# Patient Record
Sex: Female | Born: 1945
Health system: Southern US, Community
[De-identification: ages and names within clinical notes are randomized; demographics above are authoritative.]

## PROBLEM LIST (undated history)

## (undated) ENCOUNTER — Inpatient Hospital Stay (HOSPITAL_COMMUNITY): Payer: BC Managed Care – PPO

## (undated) DIAGNOSIS — K219 Gastro-esophageal reflux disease without esophagitis: Secondary | ICD-10-CM

## (undated) DIAGNOSIS — F419 Anxiety disorder, unspecified: Secondary | ICD-10-CM

## (undated) DIAGNOSIS — I10 Essential (primary) hypertension: Secondary | ICD-10-CM

## (undated) DIAGNOSIS — K449 Diaphragmatic hernia without obstruction or gangrene: Secondary | ICD-10-CM

## (undated) DIAGNOSIS — E559 Vitamin D deficiency, unspecified: Secondary | ICD-10-CM

## (undated) DIAGNOSIS — C649 Malignant neoplasm of unspecified kidney, except renal pelvis: Secondary | ICD-10-CM

## (undated) DIAGNOSIS — R0789 Other chest pain: Secondary | ICD-10-CM

## (undated) DIAGNOSIS — M810 Age-related osteoporosis without current pathological fracture: Secondary | ICD-10-CM

## (undated) HISTORY — DX: Anxiety disorder, unspecified: F41.9

## (undated) HISTORY — PX: TUBAL LIGATION: SHX77

## (undated) HISTORY — DX: Malignant neoplasm of unspecified kidney, except renal pelvis: C64.9

## (undated) HISTORY — DX: Diaphragmatic hernia without obstruction or gangrene: K44.9

## (undated) HISTORY — DX: Essential (primary) hypertension: I10

## (undated) HISTORY — DX: Gastro-esophageal reflux disease without esophagitis: K21.9

## (undated) HISTORY — DX: Other chest pain: R07.89

## (undated) HISTORY — DX: Age-related osteoporosis without current pathological fracture: M81.0

## (undated) HISTORY — DX: Vitamin D deficiency, unspecified: E55.9

---

## 1983-08-25 HISTORY — PX: LEFT OOPHORECTOMY: SHX1961

## 1997-08-24 DIAGNOSIS — C649 Malignant neoplasm of unspecified kidney, except renal pelvis: Secondary | ICD-10-CM

## 1997-08-24 HISTORY — PX: NEPHRECTOMY: SHX65

## 1997-08-24 HISTORY — DX: Malignant neoplasm of unspecified kidney, except renal pelvis: C64.9

## 1998-01-16 ENCOUNTER — Other Ambulatory Visit: Admission: RE | Admit: 1998-01-16 | Discharge: 1998-01-16 | Payer: Self-pay | Admitting: *Deleted

## 1998-05-27 ENCOUNTER — Other Ambulatory Visit: Admission: RE | Admit: 1998-05-27 | Discharge: 1998-05-27 | Payer: Self-pay | Admitting: *Deleted

## 1998-06-04 ENCOUNTER — Encounter: Payer: Self-pay | Admitting: Urology

## 1998-06-07 ENCOUNTER — Inpatient Hospital Stay (HOSPITAL_COMMUNITY): Admission: RE | Admit: 1998-06-07 | Discharge: 1998-06-10 | Payer: Self-pay | Admitting: Urology

## 1999-01-13 ENCOUNTER — Other Ambulatory Visit: Admission: RE | Admit: 1999-01-13 | Discharge: 1999-01-13 | Payer: Self-pay | Admitting: *Deleted

## 2000-07-09 ENCOUNTER — Other Ambulatory Visit: Admission: RE | Admit: 2000-07-09 | Discharge: 2000-07-09 | Payer: Self-pay | Admitting: *Deleted

## 2001-01-03 ENCOUNTER — Encounter: Admission: RE | Admit: 2001-01-03 | Discharge: 2001-01-03 | Payer: Self-pay | Admitting: Urology

## 2001-01-03 ENCOUNTER — Encounter: Payer: Self-pay | Admitting: Urology

## 2001-02-01 ENCOUNTER — Encounter: Payer: Self-pay | Admitting: *Deleted

## 2001-02-01 ENCOUNTER — Encounter: Admission: RE | Admit: 2001-02-01 | Discharge: 2001-02-01 | Payer: Self-pay | Admitting: *Deleted

## 2001-03-29 ENCOUNTER — Ambulatory Visit (HOSPITAL_COMMUNITY): Admission: RE | Admit: 2001-03-29 | Discharge: 2001-03-29 | Payer: Self-pay | Admitting: Gastroenterology

## 2002-04-23 ENCOUNTER — Encounter: Payer: Self-pay | Admitting: Emergency Medicine

## 2002-04-23 ENCOUNTER — Emergency Department (HOSPITAL_COMMUNITY): Admission: EM | Admit: 2002-04-23 | Discharge: 2002-04-23 | Payer: Self-pay | Admitting: Emergency Medicine

## 2002-08-01 ENCOUNTER — Encounter: Admission: RE | Admit: 2002-08-01 | Discharge: 2002-08-01 | Payer: Self-pay | Admitting: Internal Medicine

## 2002-08-01 ENCOUNTER — Encounter: Payer: Self-pay | Admitting: Internal Medicine

## 2002-09-22 ENCOUNTER — Encounter: Admission: RE | Admit: 2002-09-22 | Discharge: 2002-09-22 | Payer: Self-pay | Admitting: Cardiology

## 2002-09-25 ENCOUNTER — Ambulatory Visit (HOSPITAL_COMMUNITY): Admission: RE | Admit: 2002-09-25 | Discharge: 2002-09-25 | Payer: Self-pay | Admitting: Interventional Cardiology

## 2002-11-23 ENCOUNTER — Ambulatory Visit: Admission: RE | Admit: 2002-11-23 | Discharge: 2002-11-23 | Payer: Self-pay | Admitting: Gastroenterology

## 2002-11-23 ENCOUNTER — Encounter: Payer: Self-pay | Admitting: *Deleted

## 2003-02-21 ENCOUNTER — Other Ambulatory Visit: Admission: RE | Admit: 2003-02-21 | Discharge: 2003-02-21 | Payer: Self-pay | Admitting: *Deleted

## 2004-02-28 ENCOUNTER — Other Ambulatory Visit: Admission: RE | Admit: 2004-02-28 | Discharge: 2004-02-28 | Payer: Self-pay | Admitting: *Deleted

## 2004-12-29 ENCOUNTER — Emergency Department (HOSPITAL_COMMUNITY): Admission: EM | Admit: 2004-12-29 | Discharge: 2004-12-29 | Payer: Self-pay | Admitting: Emergency Medicine

## 2005-03-03 ENCOUNTER — Other Ambulatory Visit: Admission: RE | Admit: 2005-03-03 | Discharge: 2005-03-03 | Payer: Self-pay | Admitting: *Deleted

## 2005-03-09 ENCOUNTER — Encounter: Admission: RE | Admit: 2005-03-09 | Discharge: 2005-03-09 | Payer: Self-pay | Admitting: *Deleted

## 2005-03-17 ENCOUNTER — Encounter: Admission: RE | Admit: 2005-03-17 | Discharge: 2005-05-06 | Payer: Self-pay | Admitting: Internal Medicine

## 2005-09-30 ENCOUNTER — Encounter: Admission: RE | Admit: 2005-09-30 | Discharge: 2005-09-30 | Payer: Self-pay | Admitting: Cardiology

## 2005-10-23 ENCOUNTER — Encounter: Admission: RE | Admit: 2005-10-23 | Discharge: 2005-10-23 | Payer: Self-pay | Admitting: Gastroenterology

## 2005-11-04 ENCOUNTER — Encounter: Admission: RE | Admit: 2005-11-04 | Discharge: 2005-11-04 | Payer: Self-pay | Admitting: Gastroenterology

## 2006-02-17 ENCOUNTER — Ambulatory Visit (HOSPITAL_COMMUNITY): Admission: RE | Admit: 2006-02-17 | Discharge: 2006-02-17 | Payer: Self-pay | Admitting: Obstetrics & Gynecology

## 2008-05-18 ENCOUNTER — Ambulatory Visit (HOSPITAL_BASED_OUTPATIENT_CLINIC_OR_DEPARTMENT_OTHER): Admission: RE | Admit: 2008-05-18 | Discharge: 2008-05-18 | Payer: Self-pay | Admitting: Urology

## 2008-05-18 ENCOUNTER — Encounter (INDEPENDENT_AMBULATORY_CARE_PROVIDER_SITE_OTHER): Payer: Self-pay | Admitting: Urology

## 2008-12-24 ENCOUNTER — Encounter: Admission: RE | Admit: 2008-12-24 | Discharge: 2008-12-24 | Payer: Self-pay | Admitting: *Deleted

## 2009-08-20 ENCOUNTER — Other Ambulatory Visit: Admission: RE | Admit: 2009-08-20 | Discharge: 2009-08-20 | Payer: Self-pay | Admitting: Family Medicine

## 2009-09-26 ENCOUNTER — Encounter: Admission: RE | Admit: 2009-09-26 | Discharge: 2009-09-26 | Payer: Self-pay | Admitting: Family Medicine

## 2010-09-16 ENCOUNTER — Encounter
Admission: RE | Admit: 2010-09-16 | Discharge: 2010-09-16 | Payer: Self-pay | Source: Home / Self Care | Attending: Otolaryngology | Admitting: Otolaryngology

## 2011-01-06 NOTE — Op Note (Signed)
NAME:  Kimberly Wilcox, Kimberly Wilcox           ACCOUNT NO.:  [id number]   MEDICAL RECORD NO.:  [id number]          PATIENT TYPE:  AMB   LOCATION:  [facility]                         FACILITY:  [facility]   PHYSICIAN:  Jamison Neighbor, M.D.  DATE OF BIRTH:  27-Apr-1946   DATE OF PROCEDURE:  05/18/2008  DATE OF DISCHARGE:                               OPERATIVE REPORT   PREOPERATIVE DIAGNOSIS:  Chronic pelvic pain, rule out interstitial  cystitis.   POSTOPERATIVE DIAGNOSIS:  Chronic pelvic pain, rule out interstitial  cystitis.   PROCEDURE:  Cystoscopy, urethral calibration, hydrodistention of the  bladder, Marcaine and Pyridium installation, Marcaine and Kenalog  injection, bladder biopsy with cauterization.   SURGEON:  Dr. Logan Bores.   ANESTHESIA:  General.   COMPLICATIONS:  None.   DRAINS:  None.   BRIEF HISTORY:  This 65 year old female has had chronic pelvic pain  which has gotten worse.  She is interested in determining if it is  interstitial cystitis.  She understands various treatment options and  has agreed to go ahead with diagnostic cystoscopy and hydrodistention.  She is aware of the fact that there is no guarantee she will have any  improvement in her symptoms, and this is being done primarily for  diagnostic purposes.  She does note, however, there is at least some  chance she will have some improvement following a postoperative  recovery.  Full informed consent was obtained.   PROCEDURE:  After successful induction of general anesthesia, the  patient was placed in the dorsal position, prepped with Betadine, and  draped in the usual sterile fashion.  Careful bimanual examination  revealed an unremarkable pelvis with evidence of prolapse.  There was no  cystocele, rectocele or enterocele.  Urethra was palpably normal.  No  signs of diverticulum.  Urethra was calibrated with a 32-French with  female urethral sounds but no signs of stenosis or stricture.  The  cystoscope was inserted.  The  bladder was carefully inspected.  No  tumors or stones could be seen.  Both orifices were normal in  configuration and location.  Hydrodistention of the bladder was  performed.  The bladder had a near normal capacity of 900 mL.  There  were minimal granulations.  This was felt to be a pretty unremarkable  bladder.  Bladder biopsy was taken, and biopsy site was cauterized.  A  mixture of Marcaine and Pyridium was left in the bladder.  A mixture of  Marcaine and Kenalog was injected periurethrally.  The patient tolerated  the procedure well and was taken to recovery in good condition.  She  will receive  intraoperative Toradol, Zofran and a B&O suppository.  If the biopsies  show significant mast cells and/or if the patient has a major  improvement on hydrodistention, I will consider her as being positive  for interstitial cystitis.  Otherwise, will simply use symptomatic  relief as well as pelvic floor directed therapy.      Jamison Neighbor, M.D.  Electronically Signed     RJE/MEDQ  D:  05/18/2008  T:  05/19/2008  Job:  [id number]

## 2011-01-09 NOTE — Procedures (Signed)
[place]  Patient:    Kimberly Wilcox, Kimberly Wilcox                  MRN: [id number] Proc. Date: 03/29/01 Adm. Date:  [id number] Attending:  Louie Bun CC:         Jamison Neighbor, M.D.   Procedure Report  INCOMPLETE   PROCEDURE PERFORMED:  Esophagogastroduodenoscopy with biopsy.  ENDOSCOPIST:  Everardo All. Madilyn Fireman, M.D.  INDICATIONS FOR PROCEDURE:  Chronic substernal and xiphoid level abdominal pain partially relieved by H2 blockers in a patient who is also undergoing screening colonoscopy today.  DESCRIPTION OF PROCEDURE:  The patient was placed in the left lateral decubitus position and placed on the pulse monitor with continuous low flow oxygen delivered by nasal cannula. She had been DD:  03/29/01 TD:  03/29/01 Job: [id number]

## 2011-01-09 NOTE — Procedures (Signed)
[place]  Patient:    Kimberly Wilcox, Kimberly Wilcox                  MRN: [id number] Proc. Date: 03/29/01 Adm. Date:  [id number] Attending:  Louie Bun CC:         Jamison Neighbor, M.D.   Procedure Report  PROCEDURE PERFORMED:  Colonoscopy.  ENDOSCOPIST:  Everardo All. Madilyn Fireman, M.D.  INDICATIONS FOR PROCEDURE:  A thickened ascending colon and possibly terminal ileum seen on CT scan.  DESCRIPTION OF PROCEDURE:  The patient was placed in the left lateral decubitus position and placed on the pulse monitor with continuous low flow oxygen delivered by nasal cannula.  She was sedated with 50 mg IV Demerol and 5 mg IV Versed.  The Olympus video colonoscope was inserted into the rectum and advanced to the cecum, confirmed by transillumination of McBurneys point and visualization of the ileocecal valve and appendiceal orifice.  The prep was excellent.  The terminal ileum was intubated ____________ and appeared to be within normal limits.  No biopsies were taken.  The cecum, ascending, transverse, descending and sigmoid colon all appeared normal with no masses, polyps, diverticula or other mucosal abnormalities.  The rectum otherwise appeared normal and on retroflex view, the anus did reveal some small internal hemorrhoids.  The colonoscope was then withdrawn and the patient returned to the recovery room in stable condition.  She tolerated the procedure well and there were no immediate complications.  IMPRESSION:  Essentially normal colonoscopy including the terminal ileum with the exception of small internal hemorrhoids.  PLAN:  Expectant management along since the patients symptoms have improved. If the symptoms return, consider small bowel series. DD:  03/29/01 TD:  03/29/01 Job: [id number]

## 2011-01-09 NOTE — Cardiovascular Report (Signed)
NAME:  DELCIA, SPITZLEY NO.:  [id number]   MEDICAL RECORD NO.:  [id number]                   PATIENT TYPE:  OIB   LOCATION:  2899                                 FACILITY:  [facility]   PHYSICIAN:  Lesleigh Noe, M.D.            DATE OF BIRTH:  May 30, 1946   DATE OF PROCEDURE:  09/25/2002  DATE OF DISCHARGE:                              CARDIAC CATHETERIZATION   REFERRING PHYSICIANS:  1. Dr. Sharlet Salina.  2. Dr. Armanda Magic.   INDICATION:  Recurrent discomfort in the chest and an abnormal Cardiolite  study that demonstrates possible apical ischemia and inferobasal ischemia.  Of note is the patient has one kidney from prior kidney surgery for cancer.  Her BUN and creatinine are 14 and 1.0.   PROCEDURES PERFORMED:  1. Left heart catheterization.  2. Selective coronary angiography.  3. Left ventriculography.   DESCRIPTION OF PROCEDURE:  After informed consent, a 6-French sheath was  placed in the right femoral artery using a modified Seldinger technique.  The 6-French A-2 multipurpose catheter was used for hemodynamic recordings,  left ventriculography by hand injection, and attempt at selective coronary  angiography.  _______ of the right coronary artery with multipurpose  catheter resulted in coronary spasm that was relieved after 200 mcg of  intracoronary nitroglycerin.  We subsequently used a #4 left Judkins and #4  right Judkins catheter for selective coronary angiography.  Patient  tolerated the procedure without significant complications.   RESULTS:  1. Hemodynamic data:     a. Aortic pressure 118/68.     b. Left ventricular pressure 118/9.  2. Left ventriculography:  The left ventricle is normal in size and there is     normal overall contractility, EF 60%.  3. Coronary angiography:     a. Left main coronary artery:  Normal.     b. Left anterior descending coronary artery:  Normal.  LAD is large and        wraps around the left  ventricular apex and gives origin to a large        diagonal that bifurcates.  The LAD is normal.     c. Circumflex artery:  Circumflex artery is large.  It gives origin to        four obtuse marginal branches.  The third obtuse marginal is a large        branching vessel that is dominant and normal.  Circumflex artery normal.    CONCLUSIONS:  1. Normal coronary arteries.  2. Normal left ventricular function.  3. False-positive Cardiolite study.                                               Lesleigh Noe, M.D.    HWS/MEDQ  D:  09/25/2002  T:  09/25/2002  Job:  [id number]   cc:   Sharlet Salina, M.D.  [address]  [place] [address]  Fax: [phone]   Armanda Magic, M.D.  [address]  [place], [place] [address]  Fax: [phone]

## 2011-02-17 ENCOUNTER — Other Ambulatory Visit: Payer: Self-pay | Admitting: Internal Medicine

## 2011-02-17 DIAGNOSIS — Z1231 Encounter for screening mammogram for malignant neoplasm of breast: Secondary | ICD-10-CM

## 2011-02-24 ENCOUNTER — Ambulatory Visit: Payer: Self-pay

## 2011-03-12 ENCOUNTER — Ambulatory Visit
Admission: RE | Admit: 2011-03-12 | Discharge: 2011-03-12 | Disposition: A | Discharge status: Home / Self Care | Payer: BC Managed Care – PPO | Source: Ambulatory Visit | Attending: Internal Medicine | Admitting: Internal Medicine

## 2011-03-12 DIAGNOSIS — Z1231 Encounter for screening mammogram for malignant neoplasm of breast: Secondary | ICD-10-CM

## 2011-03-16 ENCOUNTER — Other Ambulatory Visit: Payer: Self-pay | Admitting: Internal Medicine

## 2011-03-16 DIAGNOSIS — R928 Other abnormal and inconclusive findings on diagnostic imaging of breast: Secondary | ICD-10-CM

## 2011-03-20 ENCOUNTER — Ambulatory Visit
Admission: RE | Admit: 2011-03-20 | Discharge: 2011-03-20 | Disposition: A | Discharge status: Home / Self Care | Payer: BC Managed Care – PPO | Source: Ambulatory Visit | Attending: Internal Medicine | Admitting: Internal Medicine

## 2011-03-20 DIAGNOSIS — R928 Other abnormal and inconclusive findings on diagnostic imaging of breast: Secondary | ICD-10-CM

## 2011-04-22 ENCOUNTER — Emergency Department (HOSPITAL_COMMUNITY): Payer: BC Managed Care – PPO

## 2011-04-22 ENCOUNTER — Emergency Department (HOSPITAL_COMMUNITY)
Admission: EM | Admit: 2011-04-22 | Discharge: 2011-04-22 | Disposition: A | Discharge status: Home / Self Care | Payer: BC Managed Care – PPO | Attending: Emergency Medicine | Admitting: Emergency Medicine

## 2011-04-22 ENCOUNTER — Encounter (HOSPITAL_COMMUNITY): Payer: Self-pay

## 2011-04-22 DIAGNOSIS — R51 Headache: Secondary | ICD-10-CM | POA: Insufficient documentation

## 2011-04-22 DIAGNOSIS — R110 Nausea: Secondary | ICD-10-CM | POA: Insufficient documentation

## 2011-04-22 LAB — POCT I-STAT, CHEM 8
BUN: 18 mg/dL (ref 6–23)
Calcium, Ion: 1.21 mmol/L (ref 1.12–1.32)
Chloride: 107 mEq/L (ref 96–112)
Creatinine, Ser: 0.9 mg/dL (ref 0.50–1.10)
Glucose, Bld: 90 mg/dL (ref 70–99)
HCT: 37 % (ref 36.0–46.0)
Hemoglobin: 12.6 g/dL (ref 12.0–15.0)
Potassium: 4.2 mEq/L (ref 3.5–5.1)
Sodium: 140 mEq/L (ref 135–145)
TCO2: 24 mmol/L (ref 0–100)

## 2011-05-19 ENCOUNTER — Other Ambulatory Visit (HOSPITAL_COMMUNITY): Payer: Self-pay | Admitting: Internal Medicine

## 2011-05-19 DIAGNOSIS — R1310 Dysphagia, unspecified: Secondary | ICD-10-CM

## 2011-05-19 DIAGNOSIS — R1011 Right upper quadrant pain: Secondary | ICD-10-CM

## 2011-05-19 DIAGNOSIS — R0789 Other chest pain: Secondary | ICD-10-CM

## 2011-05-25 ENCOUNTER — Ambulatory Visit (HOSPITAL_COMMUNITY)
Admission: RE | Admit: 2011-05-25 | Discharge: 2011-05-25 | Disposition: A | Discharge status: Home / Self Care | Payer: BC Managed Care – PPO | Source: Ambulatory Visit | Attending: Internal Medicine | Admitting: Internal Medicine

## 2011-05-25 DIAGNOSIS — K224 Dyskinesia of esophagus: Secondary | ICD-10-CM | POA: Insufficient documentation

## 2011-05-25 DIAGNOSIS — R079 Chest pain, unspecified: Secondary | ICD-10-CM | POA: Insufficient documentation

## 2011-05-25 DIAGNOSIS — R1310 Dysphagia, unspecified: Secondary | ICD-10-CM | POA: Insufficient documentation

## 2011-05-25 DIAGNOSIS — R0789 Other chest pain: Secondary | ICD-10-CM

## 2011-05-25 DIAGNOSIS — R1011 Right upper quadrant pain: Secondary | ICD-10-CM

## 2011-05-25 DIAGNOSIS — R109 Unspecified abdominal pain: Secondary | ICD-10-CM | POA: Insufficient documentation

## 2011-05-25 LAB — POCT HEMOGLOBIN-HEMACUE: Hemoglobin: 12.6

## 2011-09-01 ENCOUNTER — Other Ambulatory Visit: Payer: Self-pay | Admitting: Internal Medicine

## 2011-09-01 DIAGNOSIS — H43393 Other vitreous opacities, bilateral: Secondary | ICD-10-CM

## 2011-09-01 DIAGNOSIS — H543 Unqualified visual loss, both eyes: Secondary | ICD-10-CM

## 2011-09-07 ENCOUNTER — Ambulatory Visit
Admission: RE | Admit: 2011-09-07 | Discharge: 2011-09-07 | Disposition: A | Discharge status: Home / Self Care | Payer: BC Managed Care – PPO | Source: Ambulatory Visit | Attending: Internal Medicine | Admitting: Internal Medicine

## 2011-09-07 DIAGNOSIS — H43393 Other vitreous opacities, bilateral: Secondary | ICD-10-CM

## 2011-09-07 DIAGNOSIS — H543 Unqualified visual loss, both eyes: Secondary | ICD-10-CM

## 2011-09-08 ENCOUNTER — Other Ambulatory Visit: Payer: Self-pay | Admitting: Internal Medicine

## 2011-09-08 DIAGNOSIS — E041 Nontoxic single thyroid nodule: Secondary | ICD-10-CM

## 2011-09-11 ENCOUNTER — Ambulatory Visit
Admission: RE | Admit: 2011-09-11 | Discharge: 2011-09-11 | Disposition: A | Discharge status: Home / Self Care | Payer: BC Managed Care – PPO | Source: Ambulatory Visit | Attending: Internal Medicine | Admitting: Internal Medicine

## 2011-09-11 DIAGNOSIS — E041 Nontoxic single thyroid nodule: Secondary | ICD-10-CM

## 2011-09-17 ENCOUNTER — Other Ambulatory Visit: Payer: Self-pay | Admitting: Internal Medicine

## 2011-09-17 DIAGNOSIS — E041 Nontoxic single thyroid nodule: Secondary | ICD-10-CM

## 2011-09-23 ENCOUNTER — Other Ambulatory Visit (HOSPITAL_COMMUNITY)
Admission: RE | Admit: 2011-09-23 | Discharge: 2011-09-23 | Disposition: A | Discharge status: Home / Self Care | Payer: BC Managed Care – PPO | Source: Ambulatory Visit | Attending: Diagnostic Radiology | Admitting: Diagnostic Radiology

## 2011-09-23 ENCOUNTER — Ambulatory Visit
Admission: RE | Admit: 2011-09-23 | Discharge: 2011-09-23 | Disposition: A | Discharge status: Home / Self Care | Payer: BC Managed Care – PPO | Source: Ambulatory Visit | Attending: Internal Medicine | Admitting: Internal Medicine

## 2011-09-23 DIAGNOSIS — E041 Nontoxic single thyroid nodule: Secondary | ICD-10-CM | POA: Insufficient documentation

## 2011-12-22 ENCOUNTER — Encounter: Payer: Self-pay | Admitting: Cardiovascular Disease

## 2012-01-05 ENCOUNTER — Encounter: Payer: Self-pay | Admitting: *Deleted

## 2012-01-05 ENCOUNTER — Encounter: Payer: Self-pay | Admitting: Cardiovascular Disease

## 2012-01-05 ENCOUNTER — Ambulatory Visit (INDEPENDENT_AMBULATORY_CARE_PROVIDER_SITE_OTHER): Payer: BC Managed Care – PPO | Admitting: Cardiovascular Disease

## 2012-01-05 ENCOUNTER — Ambulatory Visit (INDEPENDENT_AMBULATORY_CARE_PROVIDER_SITE_OTHER)
Admission: RE | Admit: 2012-01-05 | Discharge: 2012-01-05 | Disposition: A | Discharge status: Home / Self Care | Payer: BC Managed Care – PPO | Source: Ambulatory Visit | Attending: Cardiovascular Disease | Admitting: Cardiovascular Disease

## 2012-01-05 DIAGNOSIS — R0789 Other chest pain: Secondary | ICD-10-CM

## 2012-01-05 DIAGNOSIS — K449 Diaphragmatic hernia without obstruction or gangrene: Secondary | ICD-10-CM | POA: Insufficient documentation

## 2012-01-05 DIAGNOSIS — K219 Gastro-esophageal reflux disease without esophagitis: Secondary | ICD-10-CM | POA: Insufficient documentation

## 2012-01-05 DIAGNOSIS — R079 Chest pain, unspecified: Secondary | ICD-10-CM

## 2012-01-05 DIAGNOSIS — F419 Anxiety disorder, unspecified: Secondary | ICD-10-CM | POA: Insufficient documentation

## 2012-01-05 DIAGNOSIS — R0602 Shortness of breath: Secondary | ICD-10-CM

## 2012-01-05 DIAGNOSIS — Z01810 Encounter for preprocedural cardiovascular examination: Secondary | ICD-10-CM

## 2012-01-05 LAB — CBC WITH DIFFERENTIAL/PLATELET
Basophils Absolute: 0 10*3/uL (ref 0.0–0.1)
Basophils Relative: 0.5 % (ref 0.0–3.0)
Eosinophils Absolute: 0.1 10*3/uL (ref 0.0–0.7)
Eosinophils Relative: 2.5 % (ref 0.0–5.0)
HCT: 38.7 % (ref 36.0–46.0)
Hemoglobin: 12.6 g/dL (ref 12.0–15.0)
Lymphocytes Relative: 47.8 % — ABNORMAL HIGH (ref 12.0–46.0)
Lymphs Abs: 2.5 10*3/uL (ref 0.7–4.0)
MCHC: 32.6 g/dL (ref 30.0–36.0)
MCV: 83.3 fl (ref 78.0–100.0)
Monocytes Absolute: 0.5 10*3/uL (ref 0.1–1.0)
Monocytes Relative: 9 % (ref 3.0–12.0)
Neutro Abs: 2.1 10*3/uL (ref 1.4–7.7)
Neutrophils Relative %: 40.2 % — ABNORMAL LOW (ref 43.0–77.0)
Platelets: 237 10*3/uL (ref 150.0–400.0)
RBC: 4.65 Mil/uL (ref 3.87–5.11)
RDW: 13.8 % (ref 11.5–14.6)
WBC: 5.2 10*3/uL (ref 4.5–10.5)

## 2012-01-05 LAB — BASIC METABOLIC PANEL
BUN: 12 mg/dL (ref 6–23)
CO2: 28 mEq/L (ref 19–32)
Calcium: 9.4 mg/dL (ref 8.4–10.5)
Chloride: 105 mEq/L (ref 96–112)
Creatinine, Ser: 0.8 mg/dL (ref 0.4–1.2)
GFR: 73.92 mL/min (ref >60.00)
Glucose, Bld: 78 mg/dL (ref 70–99)
Potassium: 3.9 mEq/L (ref 3.5–5.1)
Sodium: 141 mEq/L (ref 135–145)

## 2012-01-05 LAB — PROTIME-INR
INR: 0.9 ratio (ref 0.8–1.0)
Prothrombin Time: 10.3 s (ref 10.2–12.4)

## 2012-01-05 LAB — APTT: aPTT: 30.6 s — ABNORMAL HIGH (ref 21.7–28.8)

## 2012-01-05 MED ORDER — NITROGLYCERIN 0.4 MG SL SUBL: 0.4000 mg | SUBLINGUAL_TABLET | SUBLINGUAL | Status: DC | PRN

## 2012-01-05 NOTE — Progress Notes (Signed)
Patient ID: Kimberly Wilcox, female   DOB: 1946-04-24, 66 y.o.   MRN: [id number] 66 yo previous smoker referred by Juliene Pina for chest pain.  Exertional pain worsening since December.  No rest pain.  Previous smoker.  Reviewed baseline ECG from primary office and it is normal.  She has a lot of knowledge of heart disease as her husband has ischemic DCM and sees Dr Tresa Endo.  She has had GERD and gi problems evaluated by [facility] but this pain is different and clearly reproducable with exertion and goes away at rest.  Associated dyspnea.  No palpitations, PND orthopnea or syncope  Compliant with meds.  No bleeding diathesis.  No previous CVA  Recent carotid duplex 1/13 with 50% RICA stenosis. 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  IMPRESSION:  No evidence of right ICA stenosis. Minimal plaque on the left with  estimated ICA stenosis of less than 50%. Roughly 1.5 cm right  thyroid nodule detected. Recommend elective thyroid ultrasound for  further characterization.  ROS: Denies fever, malais, weight loss, blurry vision, decreased visual acuity, cough, sputum, SOB, hemoptysis, pleuritic pain, palpitaitons, heartburn, abdominal pain, melena, lower extremity edema, claudication, or rash.  All other systems reviewed and negative   General: Affect appropriate Healthy:  appears stated age HEENT: normal Neck supple with no adenopathy JVP normal no bruits no thyromegaly Lungs clear with no wheezing and good diaphragmatic motion Heart:  S1/S2 no murmur,rub, gallop or click PMI normal Abdomen: benighn, BS positve, no tenderness, no AAA no bruit.  No HSM or HJR Distal pulses intact with no bruits No edema Neuro non-focal Skin warm and dry No muscular weakness  Medications Current Outpatient Prescriptions  Medication Sig Dispense Refill  . aspirin 81 MG tablet Take 81 mg by mouth. When pt remebers      . Ginkgo Biloba 40 MG TABS  Take 1 tablet by mouth daily.      Marland Kitchen omeprazole (PRILOSEC) 20 MG capsule Take 20 mg by mouth daily.      Marland Kitchen VITAMIN D, CHOLECALCIFEROL, PO Take 1 tablet by mouth daily.        Allergies Nitrofurantoin monohyd macro and Sulfa antibiotics  Family History: History reviewed. No pertinent family history.  Social History: History   Social History  . Marital Status: Married    Spouse Name: N/A    Number of Children: N/A  . Years of Education: N/A   Occupational History  . Not on file.   Social History Main Topics  . Smoking status: Former Games developer  . Smokeless tobacco: Not on file  . Alcohol Use: Not on file  . Drug Use: Not on file  . Sexually Active: Not on file   Other Topics Concern  . Not on file   Social History Narrative  . No narrative on file    Electrocardiogram:  Assessment and Plan

## 2012-01-05 NOTE — Assessment & Plan Note (Signed)
Likley related to chest pain and anginal equivalent.  Will have CXR prior to cath and check EF/LVEDP

## 2012-01-05 NOTE — Assessment & Plan Note (Signed)
Continue H2 blocker.  Given chronic GI issues if she needs intervention may be best to use Effient

## 2012-01-05 NOTE — Assessment & Plan Note (Signed)
Worrisome symptoms that are progressive and clearly exertional.  Discussed options including stress testing and cath. 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  Risks including stroke discussed [place] to proceed.  She would like to do it on a Friday and I am gone next week so will have Dr Shirlee Latch do this Friday Nitro called in

## 2012-01-05 NOTE — Patient Instructions (Signed)
Your physician recommends that you continue on your current medications as directed. Please refer to the Current Medication list given to you today. Your physician recommends that you return for lab work in: TODAY BMET CBC PT PTT  DX  V72.81   A chest x-ray takes a picture of the organs and structures inside the chest, including the heart, lungs, and blood vessels. This test can show several things, including, whether the heart is enlarges; whether fluid is building up in the lungs; and whether pacemaker / defibrillator leads are still in place. DX CHEST PAIN .Your physician has requested that you have a cardiac catheterization. Cardiac catheterization is used to diagnose and/or treat various heart conditions. Doctors may recommend this procedure for a number of different reasons. The most common reason is to evaluate chest pain. Chest pain can be a symptom of coronary artery disease (CAD), and cardiac catheterization can show whether plaque is narrowing or blocking your heart's arteries. This procedure is also used to evaluate the valves, as well as measure the blood flow and oxygen levels in different parts of your heart. For further information please visit [email or web address] Please follow instruction sheet, as given.

## 2012-01-06 ENCOUNTER — Other Ambulatory Visit: Payer: Self-pay | Admitting: Cardiovascular Disease

## 2012-01-08 ENCOUNTER — Inpatient Hospital Stay (HOSPITAL_BASED_OUTPATIENT_CLINIC_OR_DEPARTMENT_OTHER)
Admission: RE | Admit: 2012-01-08 | Discharge: 2012-01-08 | Disposition: A | Discharge status: Home / Self Care | Payer: BC Managed Care – PPO | Source: Ambulatory Visit | Attending: Cardiology | Admitting: Cardiology

## 2012-01-08 ENCOUNTER — Encounter (HOSPITAL_BASED_OUTPATIENT_CLINIC_OR_DEPARTMENT_OTHER): Payer: Self-pay | Admitting: *Deleted

## 2012-01-08 ENCOUNTER — Encounter (HOSPITAL_BASED_OUTPATIENT_CLINIC_OR_DEPARTMENT_OTHER)
Admission: RE | Disposition: A | Discharge status: Home / Self Care | Payer: Self-pay | Source: Ambulatory Visit | Attending: Cardiology

## 2012-01-08 DIAGNOSIS — R0789 Other chest pain: Secondary | ICD-10-CM | POA: Insufficient documentation

## 2012-01-08 DIAGNOSIS — R079 Chest pain, unspecified: Secondary | ICD-10-CM

## 2012-01-08 DIAGNOSIS — R0989 Other specified symptoms and signs involving the circulatory and respiratory systems: Secondary | ICD-10-CM | POA: Insufficient documentation

## 2012-01-08 DIAGNOSIS — R0609 Other forms of dyspnea: Secondary | ICD-10-CM | POA: Insufficient documentation

## 2012-01-08 SURGERY — JV LEFT HEART CATHETERIZATION WITH CORONARY ANGIOGRAM
Anesthesia: Moderate Sedation

## 2012-01-08 MED ORDER — SODIUM CHLORIDE 0.9 % IJ SOLN: 3.0000 mL | Freq: Two times a day (BID) | INTRAMUSCULAR | Status: DC

## 2012-01-08 MED ORDER — ASPIRIN 81 MG PO CHEW
324.0000 mg | CHEWABLE_TABLET | ORAL | Status: AC
  Administered 2012-01-08: 243 mg via ORAL

## 2012-01-08 MED ORDER — SODIUM CHLORIDE 0.9 % IV SOLN
250.0000 mL | INTRAVENOUS | Status: DC | PRN
  Administered 2012-01-08: 100 mL via INTRAVENOUS

## 2012-01-08 MED ORDER — SODIUM CHLORIDE 0.9 % IJ SOLN: 3.0000 mL | INTRAMUSCULAR | Status: DC | PRN

## 2012-01-08 NOTE — CV Procedure (Signed)
   Cardiac Catheterization Procedure Note  Name: Kimberly Wilcox MRN: [id number] DOB: 09-20-1945  Procedure: Left Heart Cath, Selective Coronary Angiography, LV angiography  Indication: Chest pain   Procedural details: The right groin was prepped, draped, and anesthetized with 1% lidocaine. Using modified Seldinger technique, a 5 French sheath was introduced into the right femoral artery. Standard Judkins catheters were used for coronary angiography and left ventriculography. Catheter exchanges were performed over a guidewire. There were no immediate procedural complications. The patient was transferred to the post catheterization recovery area for further monitoring.  Procedural Findings: Hemodynamics:  AO 136/68 LV 140/18   Coronary angiography: Coronary dominance: right  Left mainstem: No angiographic CAD  Left anterior descending (LAD): No angiographic CAD  Left circumflex (LCx): No angiographic CAD  Right coronary artery (RCA): No angiographic CAD  Left ventriculography: Left ventricular systolic function is normal, LVEF is estimated at 55-60%, there is no significant mitral regurgitation, no wall motion abnormalities.  Final Conclusions:  No angiographic CAD, normal LV systolic function.  Suspect noncardiac chest pain.   Marca Ancona 01/08/2012, 8:08 AM

## 2012-01-08 NOTE — Interval H&P Note (Signed)
History and Physical Interval Note:  01/08/2012 7:46 AM  Kimberly Wilcox  has presented today for surgery, with the diagnosis of chest pain  The various methods of treatment have been discussed with the patient and family. After consideration of risks, benefits and other options for treatment, the patient has consented to  Procedure(s) (LRB): JV LEFT HEART CATHETERIZATION WITH CORONARY ANGIOGRAM (N/A) as a surgical intervention .  The patients' history has been reviewed, patient examined, no change in status, stable for surgery.  I have reviewed the patients' chart and labs.  Questions were answered to the patient's satisfaction.     Riham Polyakov [employer]

## 2012-01-08 NOTE — H&P (View-Only) (Signed)
Patient ID: Kimberly Wilcox, female   DOB: 04/15/1946, 65 y.o.   MRN: [id number] 65 yo previous smoker referred by Amanda Collar for chest pain.  Exertional pain worsening since December.  No rest pain.  Previous smoker.  Reviewed baseline ECG from primary office and it is normal.  She has a lot of knowledge of heart disease as her husband has ischemic DCM and sees Dr Kelly.  She has had GERD and gi problems evaluated by [facility] but this pain is different and clearly reproducable with exertion and goes away at rest.  Associated dyspnea.  No palpitations, PND orthopnea or syncope  Compliant with meds.  No bleeding diathesis.  No previous CVA  Recent carotid duplex 1/13 with 50% RICA stenosis. 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  IMPRESSION:  No evidence of right ICA stenosis. Minimal plaque on the left with  estimated ICA stenosis of less than 50%. Roughly 1.5 cm right  thyroid nodule detected. Recommend elective thyroid ultrasound for  further characterization.  ROS: Denies fever, malais, weight loss, blurry vision, decreased visual acuity, cough, sputum, SOB, hemoptysis, pleuritic pain, palpitaitons, heartburn, abdominal pain, melena, lower extremity edema, claudication, or rash.  All other systems reviewed and negative   General: Affect appropriate Healthy:  appears stated age HEENT: normal Neck supple with no adenopathy JVP normal no bruits no thyromegaly Lungs clear with no wheezing and good diaphragmatic motion Heart:  S1/S2 no murmur,rub, gallop or click PMI normal Abdomen: benighn, BS positve, no tenderness, no AAA no bruit.  No HSM or HJR Distal pulses intact with no bruits No edema Neuro non-focal Skin warm and dry No muscular weakness  Medications Current Outpatient Prescriptions  Medication Sig Dispense Refill  . aspirin 81 MG tablet Take 81 mg by mouth. When pt remebers      . Ginkgo Biloba 40 MG TABS  Take 1 tablet by mouth daily.      . omeprazole (PRILOSEC) 20 MG capsule Take 20 mg by mouth daily.      . VITAMIN D, CHOLECALCIFEROL, PO Take 1 tablet by mouth daily.        Allergies Nitrofurantoin monohyd macro and Sulfa antibiotics  Family History: History reviewed. No pertinent family history.  Social History: History   Social History  . Marital Status: Married    Spouse Name: N/A    Number of Children: N/A  . Years of Education: N/A   Occupational History  . Not on file.   Social History Main Topics  . Smoking status: Former Smoker  . Smokeless tobacco: Not on file  . Alcohol Use: Not on file  . Drug Use: Not on file  . Sexually Active: Not on file   Other Topics Concern  . Not on file   Social History Narrative  . No narrative on file    Electrocardiogram:  Assessment and Plan   

## 2012-01-08 NOTE — Progress Notes (Signed)
Bedrest begins @ 0830. 

## 2012-01-22 ENCOUNTER — Encounter: Payer: BC Managed Care – PPO | Admitting: Nurse Practitioner

## 2012-05-31 ENCOUNTER — Other Ambulatory Visit: Payer: Self-pay | Admitting: Physician Assistant

## 2012-05-31 DIAGNOSIS — Z1231 Encounter for screening mammogram for malignant neoplasm of breast: Secondary | ICD-10-CM

## 2012-07-06 ENCOUNTER — Ambulatory Visit
Admission: RE | Admit: 2012-07-06 | Discharge: 2012-07-06 | Disposition: A | Discharge status: Home / Self Care | Payer: BC Managed Care – PPO | Source: Ambulatory Visit | Attending: Physician Assistant | Admitting: Physician Assistant

## 2012-07-06 DIAGNOSIS — Z1231 Encounter for screening mammogram for malignant neoplasm of breast: Secondary | ICD-10-CM

## 2012-11-08 ENCOUNTER — Other Ambulatory Visit: Payer: Self-pay | Admitting: Physician Assistant

## 2012-11-08 DIAGNOSIS — E049 Nontoxic goiter, unspecified: Secondary | ICD-10-CM

## 2012-11-10 ENCOUNTER — Ambulatory Visit
Admission: RE | Admit: 2012-11-10 | Discharge: 2012-11-10 | Disposition: A | Discharge status: Home / Self Care | Payer: BC Managed Care – PPO | Source: Ambulatory Visit | Attending: Physician Assistant | Admitting: Physician Assistant

## 2012-11-10 DIAGNOSIS — E049 Nontoxic goiter, unspecified: Secondary | ICD-10-CM

## 2012-12-27 ENCOUNTER — Other Ambulatory Visit: Payer: Self-pay | Admitting: Dermatology

## 2013-01-12 ENCOUNTER — Ambulatory Visit
Admission: RE | Admit: 2013-01-12 | Discharge: 2013-01-12 | Disposition: A | Discharge status: Home / Self Care | Payer: BC Managed Care – PPO | Source: Ambulatory Visit | Attending: Internal Medicine | Admitting: Internal Medicine

## 2013-01-12 ENCOUNTER — Other Ambulatory Visit: Payer: Self-pay | Admitting: Internal Medicine

## 2013-01-12 DIAGNOSIS — M79605 Pain in left leg: Secondary | ICD-10-CM

## 2013-04-19 ENCOUNTER — Other Ambulatory Visit: Payer: Self-pay | Admitting: Internal Medicine

## 2013-04-19 DIAGNOSIS — H538 Other visual disturbances: Secondary | ICD-10-CM

## 2013-04-19 DIAGNOSIS — R519 Headache, unspecified: Secondary | ICD-10-CM

## 2013-04-24 ENCOUNTER — Ambulatory Visit (HOSPITAL_COMMUNITY)
Admission: AD | Admit: 2013-04-24 | Discharge: 2013-04-24 | Disposition: A | Discharge status: Home / Self Care | Payer: BC Managed Care – PPO | Source: Ambulatory Visit | Attending: Internal Medicine | Admitting: Internal Medicine

## 2013-04-24 ENCOUNTER — Other Ambulatory Visit (HOSPITAL_COMMUNITY): Payer: Self-pay | Admitting: Internal Medicine

## 2013-04-24 ENCOUNTER — Ambulatory Visit (HOSPITAL_COMMUNITY)
Admission: RE | Admit: 2013-04-24 | Discharge: 2013-04-24 | Disposition: A | Discharge status: Home / Self Care | Payer: BC Managed Care – PPO | Source: Ambulatory Visit | Attending: Internal Medicine | Admitting: Internal Medicine

## 2013-04-24 DIAGNOSIS — M545 Low back pain, unspecified: Secondary | ICD-10-CM

## 2013-04-24 DIAGNOSIS — M129 Arthropathy, unspecified: Secondary | ICD-10-CM | POA: Insufficient documentation

## 2013-04-24 DIAGNOSIS — M542 Cervicalgia: Secondary | ICD-10-CM | POA: Insufficient documentation

## 2013-04-24 DIAGNOSIS — K5900 Constipation, unspecified: Secondary | ICD-10-CM | POA: Insufficient documentation

## 2013-04-24 DIAGNOSIS — M549 Dorsalgia, unspecified: Secondary | ICD-10-CM | POA: Insufficient documentation

## 2013-04-24 DIAGNOSIS — Q762 Congenital spondylolisthesis: Secondary | ICD-10-CM | POA: Insufficient documentation

## 2013-04-28 ENCOUNTER — Ambulatory Visit
Admission: RE | Admit: 2013-04-28 | Discharge: 2013-04-28 | Disposition: A | Discharge status: Home / Self Care | Payer: BC Managed Care – PPO | Source: Ambulatory Visit | Attending: Internal Medicine | Admitting: Internal Medicine

## 2013-04-28 DIAGNOSIS — H538 Other visual disturbances: Secondary | ICD-10-CM

## 2013-04-28 DIAGNOSIS — R519 Headache, unspecified: Secondary | ICD-10-CM

## 2013-04-28 MED ORDER — GADOBENATE DIMEGLUMINE 529 MG/ML IV SOLN
13.0000 mL | Freq: Once | INTRAVENOUS | Status: AC | PRN
  Administered 2013-04-28: 13 mL via INTRAVENOUS

## 2013-06-14 ENCOUNTER — Other Ambulatory Visit: Payer: Self-pay | Admitting: Internal Medicine

## 2013-06-14 DIAGNOSIS — R109 Unspecified abdominal pain: Secondary | ICD-10-CM

## 2013-06-14 DIAGNOSIS — R110 Nausea: Secondary | ICD-10-CM

## 2013-06-16 ENCOUNTER — Ambulatory Visit
Admission: RE | Admit: 2013-06-16 | Discharge: 2013-06-16 | Disposition: A | Discharge status: Home / Self Care | Payer: BC Managed Care – PPO | Source: Ambulatory Visit | Attending: Internal Medicine | Admitting: Internal Medicine

## 2013-06-16 DIAGNOSIS — R109 Unspecified abdominal pain: Secondary | ICD-10-CM

## 2013-06-16 DIAGNOSIS — R110 Nausea: Secondary | ICD-10-CM

## 2013-06-16 MED ORDER — IOHEXOL 300 MG/ML  SOLN
100.0000 mL | Freq: Once | INTRAMUSCULAR | Status: AC | PRN
  Administered 2013-06-16: 100 mL via INTRAVENOUS

## 2013-08-23 ENCOUNTER — Other Ambulatory Visit: Payer: Self-pay | Admitting: Physician Assistant

## 2013-09-27 ENCOUNTER — Other Ambulatory Visit: Payer: Self-pay

## 2013-09-27 DIAGNOSIS — Z1231 Encounter for screening mammogram for malignant neoplasm of breast: Secondary | ICD-10-CM

## 2013-10-01 DIAGNOSIS — E559 Vitamin D deficiency, unspecified: Secondary | ICD-10-CM | POA: Insufficient documentation

## 2013-10-04 ENCOUNTER — Ambulatory Visit (INDEPENDENT_AMBULATORY_CARE_PROVIDER_SITE_OTHER): Payer: BC Managed Care – PPO | Admitting: Physician Assistant

## 2013-10-04 ENCOUNTER — Encounter: Payer: Self-pay | Admitting: Physician Assistant

## 2013-10-04 VITALS — BP 120/76 | HR 64 | Temp 97.3°F | Resp 18 | Ht 61.0 in | Wt 148.4 lb

## 2013-10-04 DIAGNOSIS — N3000 Acute cystitis without hematuria: Secondary | ICD-10-CM

## 2013-10-04 DIAGNOSIS — Z0000 Encounter for general adult medical examination without abnormal findings: Secondary | ICD-10-CM

## 2013-10-04 DIAGNOSIS — M810 Age-related osteoporosis without current pathological fracture: Secondary | ICD-10-CM

## 2013-10-04 DIAGNOSIS — I10 Essential (primary) hypertension: Secondary | ICD-10-CM

## 2013-10-04 DIAGNOSIS — R109 Unspecified abdominal pain: Secondary | ICD-10-CM

## 2013-10-04 DIAGNOSIS — Z79899 Other long term (current) drug therapy: Secondary | ICD-10-CM

## 2013-10-04 DIAGNOSIS — E559 Vitamin D deficiency, unspecified: Secondary | ICD-10-CM

## 2013-10-04 LAB — CBC WITH DIFFERENTIAL/PLATELET
Basophils Absolute: 0 10*3/uL (ref 0.0–0.1)
Basophils Relative: 0 % (ref 0–1)
Eosinophils Absolute: 0.1 10*3/uL (ref 0.0–0.7)
Eosinophils Relative: 1 % (ref 0–5)
HCT: 40.6 % (ref 36.0–46.0)
Hemoglobin: 13.4 g/dL (ref 12.0–15.0)
Lymphocytes Relative: 42 % (ref 12–46)
Lymphs Abs: 2.3 10*3/uL (ref 0.7–4.0)
MCH: 26.9 pg (ref 26.0–34.0)
MCHC: 33 g/dL (ref 30.0–36.0)
MCV: 81.4 fL (ref 78.0–100.0)
Monocytes Absolute: 0.3 10*3/uL (ref 0.1–1.0)
Monocytes Relative: 5 % (ref 3–12)
Neutro Abs: 2.7 10*3/uL (ref 1.7–7.7)
Neutrophils Relative %: 52 % (ref 43–77)
Platelets: 287 10*3/uL (ref 150–400)
RBC: 4.99 MIL/uL (ref 3.87–5.11)
RDW: 14 % (ref 11.5–15.5)
WBC: 5.3 10*3/uL (ref 4.0–10.5)

## 2013-10-04 LAB — LIPID PANEL
Cholesterol: 213 mg/dL — ABNORMAL HIGH (ref 0–200)
HDL: 74 mg/dL (ref >39)
LDL Cholesterol: 120 mg/dL — ABNORMAL HIGH (ref 0–99)
Total CHOL/HDL Ratio: 2.9 Ratio
Triglycerides: 93 mg/dL (ref <150)
VLDL: 19 mg/dL (ref 0–40)

## 2013-10-04 LAB — BASIC METABOLIC PANEL WITH GFR
BUN: 10 mg/dL (ref 6–23)
CO2: 29 mEq/L (ref 19–32)
Calcium: 9.3 mg/dL (ref 8.4–10.5)
Chloride: 103 mEq/L (ref 96–112)
Creat: 0.83 mg/dL (ref 0.50–1.10)
GFR, Est African American: 84 mL/min
GFR, Est Non African American: 73 mL/min
Glucose, Bld: 85 mg/dL (ref 70–99)
Potassium: 4 mEq/L (ref 3.5–5.3)
Sodium: 138 mEq/L (ref 135–145)

## 2013-10-04 LAB — HEPATIC FUNCTION PANEL
ALT: <8 U/L (ref 0–35)
AST: 20 U/L (ref 0–37)
Albumin: 4.2 g/dL (ref 3.5–5.2)
Alkaline Phosphatase: 64 U/L (ref 39–117)
Bilirubin, Direct: 0.1 mg/dL (ref 0.0–0.3)
Indirect Bilirubin: 0.4 mg/dL (ref 0.2–1.2)
Total Bilirubin: 0.5 mg/dL (ref 0.2–1.2)
Total Protein: 7.2 g/dL (ref 6.0–8.3)

## 2013-10-04 LAB — IRON AND TIBC
%SAT: 24 % (ref 20–55)
Iron: 75 ug/dL (ref 42–145)
TIBC: 317 ug/dL (ref 250–470)
UIBC: 242 ug/dL (ref 125–400)

## 2013-10-04 LAB — MAGNESIUM: Magnesium: 1.8 mg/dL (ref 1.5–2.5)

## 2013-10-04 LAB — HEMOGLOBIN A1C
Hgb A1c MFr Bld: 5.5 % (ref <5.7)
Mean Plasma Glucose: 111 mg/dL (ref <117)

## 2013-10-04 LAB — TSH: TSH: 4.008 u[IU]/mL (ref 0.350–4.500)

## 2013-10-04 LAB — VITAMIN B12: Vitamin B-12: 315 pg/mL (ref 211–911)

## 2013-10-04 NOTE — Patient Instructions (Signed)
Try the Linzess for 2-4 week, one pill 30 min before food.  Then try Dexilant samples 1 pill daily for 10 days Then try Lyrica 1-2 pills at night for 1-2 weeks Then call me and we will try Celexa 20mg  1/2 pill daily    Bad carbs also include fruit juice, alcohol, and sweet tea. These are empty calories that do not signal to your brain that you are full.   Please remember the good carbs are still carbs which convert into sugar. So please measure them out no more than 1/2-1 cup of rice, oatmeal, pasta, and beans.  Veggies are however free foods! Pile them on.   I like lean protein at every meal such as chicken, [place], pork chops, cottage cheese, etc. Just do not fry these meats and please center your meal around vegetable, the meats should be a side dish.   No all fruit is created equal. Please see the list below, the fruit at the bottom is higher in sugars than the fruit at the top   Cholesterol Cholesterol is a white, waxy, fat-like protein needed by your body in small amounts. The liver makes all the cholesterol you need. It is carried from the liver by the blood through the blood vessels. Deposits (plaque) may build up on blood vessel walls. This makes the arteries narrower and stiffer. Plaque increases the risk for heart attack and stroke. You cannot feel your cholesterol level even if it is very high. The only way to know is by a blood test to check your lipid (fats) levels. Once you know your cholesterol levels, you should keep a record of the test results. Work with your caregiver to to keep your levels in the desired range. WHAT THE RESULTS MEAN:  Total cholesterol is a rough measure of all the cholesterol in your blood.  LDL is the so-called bad cholesterol. This is the type that deposits cholesterol in the walls of the arteries. You want this level to be low.  HDL is the good cholesterol because it cleans the arteries and carries the LDL away. You want this level to be  high.  Triglycerides are fat that the body can either burn for energy or store. High levels are closely linked to heart disease. DESIRED LEVELS:  Total cholesterol below 200.  LDL below 100 for people at risk, below 70 for very high risk.  HDL above 50 is good, above 60 is best.  Triglycerides below 150. HOW TO LOWER YOUR CHOLESTEROL:  Diet.  Choose fish or white meat chicken and [place], roasted or baked. Limit fatty cuts of red meat, fried foods, and processed meats, such as sausage and lunch meat.  Eat lots of fresh fruits and vegetables. Choose whole grains, beans, pasta, potatoes and cereals.  Use only small amounts of olive, corn or canola oils. Avoid butter, mayonnaise, shortening or palm kernel oils. Avoid foods with trans-fats.  Use skim/nonfat milk and low-fat/nonfat yogurt and cheeses. Avoid whole milk, cream, ice cream, egg yolks and cheeses. Healthy desserts include angel food cake, ginger snaps, animal crackers, hard candy, popsicles, and low-fat/nonfat frozen yogurt. Avoid pastries, cakes, pies and cookies.  Exercise.  A regular program helps decrease LDL and raises HDL.  Helps with weight control.  Do things that increase your activity level like gardening, walking, or taking the stairs.  Medication.  May be prescribed by your caregiver to help lowering cholesterol and the risk for heart disease.  You may need medicine even if your levels  are normal if you have several risk factors. HOME CARE INSTRUCTIONS   Follow your diet and exercise programs as suggested by your caregiver.  Take medications as directed.  Have blood work done when your caregiver feels it is necessary. MAKE SURE YOU:   Understand these instructions.  Will watch your condition.  Will get help right away if you are not doing well or get worse. Document Released: 05/05/2001 Document Revised: 11/02/2011 Document Reviewed: 05/24/2013 [facility] Patient Information 2014 [place],  [place].

## 2013-10-04 NOTE — Progress Notes (Signed)
Complete Physical HPI 68 y.o. female  presents for a complete physical. Her blood pressure has been controlled at home, today their BP is BP: 120/76 mmHg She denies chest pain, shortness of breath, dizziness.  Her cholesterol is diet controlled. Her cholesterol is controlled. The cholesterol last visit was:LDL 103   She has been working on diet and exercise for prediabetes, and denies blurry vision, polydipsia, polyphagia and polyuria. Last A1C in the office was: 5.7 Patient is on Vitamin D supplement.  States she has had an intermittent discomfort for years, worse in the past week. Worse at rest/night, feels like a vibrations/spasm, runs from suprapubic to epigastric area, will keep her up at night. No other accompaniments with it, has taken flexeril without help.  She has seen a kidney specialist, had had CT AB normal, has had normal cath with Dr. Johnsie Cancel. States it may be worse with anxiety.   Current Medications:  Current Outpatient Prescriptions on File Prior to Visit  Medication Sig Dispense Refill  . ALPRAZolam (XANAX) 0.5 MG tablet take 1 tablet by mouth three times a day if needed  90 tablet  0  . aspirin 81 MG tablet Take 81 mg by mouth. When pt remebers      . Ginkgo Biloba 40 MG TABS Take 1 tablet by mouth daily.      . nitroGLYCERIN (NITROSTAT) 0.4 MG SL tablet Place 1 tablet (0.4 mg total) under the tongue every 5 (five) minutes as needed for chest pain.  25 tablet  4  . omeprazole (PRILOSEC) 20 MG capsule Take 20 mg by mouth daily.      Marland Kitchen VITAMIN D, CHOLECALCIFEROL, PO Take 1 tablet by mouth daily.       No current facility-administered medications on file prior to visit.   Health Maintenance:  Tetanus: 2011 Pneumovax: 2014 Flu vaccine: declines Zostavax: will call insurance Pap: 2011 never abnormal pap MGM: 06/2012 normal scheduled for one this month DEXA: 2011 Osteoporsis Colonoscopy: 2010 normal ([facility]) due 2020 EGD: N/A  Allergies:  Allergies  Allergen Reactions   . Nitrofurantoin Monohyd Macro   . Sulfa Antibiotics    Medical History:  Past Medical History  Diagnosis Date  . GERD (gastroesophageal reflux disease)   . Chest discomfort   . Hiatal hernia   . Anxiety   . Vitamin D deficiency   . Osteoporosis   . Renal cell cancer 1999    LEFT NEPHRECTOMY  . Hypertension     labile   Surgical History:  Past Surgical History  Procedure Laterality Date  . Nephrectomy Left 1999    secondary to cancer  . Left oophorectomy  1985    Benign tumor  . Tubal ligation     Family History:  Family History  Problem Relation Age of Onset  . Pneumonia Father    Social History:  History   Social History  . Marital Status: Married    Spouse Name: N/A    Number of Children: N/A  . Years of Education: N/A   Occupational History  . Not on file.   Social History Main Topics  . Smoking status: Former Research scientist (life sciences)  . Smokeless tobacco: Not on file  . Alcohol Use: Not on file  . Drug Use: Not on file  . Sexual Activity: Not on file   Other Topics Concern  . Not on file   Social History Narrative  . No narrative on file   ROS Constitutional: Denies weight loss/gain, headaches, insomnia, fatigue, night sweats, and  change in appetite. Eyes: DEE 2014 normal, Dr. Katy Fitch appt 09/2013 + cataracts. Denies redness, blurred vision, diplopia, discharge, itchy, watery eyes.  ENT: Denies discharge, congestion, post nasal drip, sore throat, earache, hearing loss, dental pain, Tinnitus, Vertigo, Sinus pain, snoring.  Cardio: Restaurant manager, fast food) Carotid U/S RICA neg, LICA <%47 Denies chest pain, palpitations, irregular heartbeat, dyspnea, diaphoresis, orthopnea, PND, claudication, edema Respiratory: denies cough, dyspnea, pleurisy, hoarseness, wheezing.  Gastrointestinal: + constipation, heartburn, cramping  Normal CT AB 05/2013 Denies dysphagia, pain,nausea, vomiting, bloating, diarrhea, hematemesis, melena, hematochezia, hemorrhoids Genitourinary: (Manny)  + incontinence  Denies dysuria, frequency, urgency, nocturia, hesitancy, discharge, hematuria, flank pain Breast: Denies Breast lumps, nipple discharge, bleeding.  Musculoskeletal: Denies arthralgia, myalgia, stiffness, Jt. Swelling, pain, Skin: Denies pruritis, rash, hives,  acne, eczema, changing in skin lesion Neuro: + paresthesia feet and hands occ Normal MRI head 04/2013 Denies Weakness, tremor, incoordination, spasms, pain Psychiatric: Denies confusion, memory loss, sensory loss Endocrine: thyroid BX normal 2013 Denies change in weight, skin, hair change, nocturia, and paresthesia, Diabetic Denies Polys, visual blurring, hyper /hypo glycemic episodes.  Heme/Lymph: Denies Excessive bleeding, bruising, enlarged lymph nodes  Physical Exam: Estimated body mass index is 28.05 kg/(m^2) as calculated from the following:   Height as of this encounter: 5\' 1"  (1.549 m).   Weight as of this encounter: 148 lb 6.4 oz (67.314 kg). Filed Vitals:   10/04/13 0925  BP: 120/76  Pulse: 64  Temp: 97.3 F (36.3 C)  Resp: 18   General Appearance: Well nourished, in no apparent distress. Eyes: PERRLA, EOMs, conjunctiva no swelling or erythema, normal fundi and vessels. Sinuses: No Frontal/maxillary tenderness ENT/Mouth: Ext aud canals clear, normal light reflex with TMs without erythema, bulging.  Good dentition. No erythema, swelling, or exudate on post pharynx. Tonsils not swollen or erythematous. Hearing normal.  Neck: Supple, thyroid normal. No bruits Respiratory: Respiratory effort normal, BS equal bilaterally without rales, rhonchi, wheezing or stridor. Cardio: RRR without murmurs, rubs or gallops. Brisk peripheral pulses without edema.  Chest: symmetric, with normal excursions and percussion. Breasts: defer Abdomen: Soft, +BS. Epigastric tender, no guarding, rebound, hernias, masses, or organomegaly. .  Lymphatics: Non tender without lymphadenopathy.  Genitourinary: defer Musculoskeletal: Full ROM all  peripheral extremities,5/5 strength, and normal gait. Skin: Warm, dry without rashes, lesions, ecchymosis.  Neuro: Cranial nerves intact, reflexes equal bilaterally. Normal muscle tone, no cerebellar symptoms. Sensation intact.  Psych: Awake and oriented X 3, normal affect, Insight and Judgment appropriate.   EKG: WNL no changes.  Assessment and Plan: GERD (gastroesophageal reflux disease)- ? Controlled- Dexilant samples given  Hiatal hernia- cont monitor  Anxiety- controlled  Vitamin D deficiency- check level  Osteoporosis- get DEXA, continue Ca, Vit D, and weight bearing exercises.  HTN- controlled diet HX L RCC- continue follow up Dr. Tresa Moore Abdominal pain? Constipation, anxiety, GERD, Nerve/muscular- try linzess first, then PPI, then Gabapetin then Celexa 20 (1/2)   Discussed med's effects and SE's. Screening labs and tests as requested with regular follow-up as recommended.   Vicie Mutters 9:43 AM

## 2013-10-05 LAB — URINALYSIS, ROUTINE W REFLEX MICROSCOPIC
Bilirubin Urine: NEGATIVE
Glucose, UA: NEGATIVE mg/dL
Hgb urine dipstick: NEGATIVE
Ketones, ur: NEGATIVE mg/dL
Leukocytes, UA: NEGATIVE
Nitrite: NEGATIVE
Protein, ur: NEGATIVE mg/dL
Specific Gravity, Urine: <1.005 — ABNORMAL LOW (ref 1.005–1.030)
Urobilinogen, UA: 0.2 mg/dL (ref 0.0–1.0)
pH: 7 (ref 5.0–8.0)

## 2013-10-05 LAB — VITAMIN D 25 HYDROXY (VIT D DEFICIENCY, FRACTURES): Vit D, 25-Hydroxy: 47 ng/mL (ref 30–89)

## 2013-10-05 LAB — INSULIN, FASTING: Insulin fasting, serum: 7 u[IU]/mL (ref 3–28)

## 2013-10-05 LAB — MICROALBUMIN / CREATININE URINE RATIO
Creatinine, Urine: 25 mg/dL
Microalb Creat Ratio: 20 mg/g (ref 0.0–30.0)
Microalb, Ur: 0.5 mg/dL (ref 0.00–1.89)

## 2013-10-05 LAB — FOLATE RBC: RBC Folate: 440 ng/mL (ref >280)

## 2013-10-05 LAB — URINE CULTURE
Colony Count: NO GROWTH
Organism ID, Bacteria: NO GROWTH

## 2013-10-06 ENCOUNTER — Other Ambulatory Visit: Payer: Self-pay | Admitting: Physician Assistant

## 2013-10-06 MED ORDER — LEVOTHYROXINE SODIUM 50 MCG PO TABS: ORAL_TABLET | ORAL | Status: DC

## 2013-10-17 ENCOUNTER — Ambulatory Visit: Payer: BC Managed Care – PPO

## 2013-11-02 ENCOUNTER — Other Ambulatory Visit: Payer: Self-pay

## 2013-11-03 ENCOUNTER — Ambulatory Visit
Admission: RE | Admit: 2013-11-03 | Discharge: 2013-11-03 | Disposition: A | Discharge status: Home / Self Care | Payer: BC Managed Care – PPO | Source: Ambulatory Visit

## 2013-11-03 DIAGNOSIS — Z1231 Encounter for screening mammogram for malignant neoplasm of breast: Secondary | ICD-10-CM

## 2014-02-19 ENCOUNTER — Encounter: Payer: Self-pay | Admitting: Emergency Medicine

## 2014-02-19 ENCOUNTER — Ambulatory Visit (INDEPENDENT_AMBULATORY_CARE_PROVIDER_SITE_OTHER): Payer: BC Managed Care – PPO | Admitting: Emergency Medicine

## 2014-02-19 VITALS — BP 120/72 | HR 68 | Temp 98.1°F | Resp 16 | Wt 148.2 lb

## 2014-02-19 DIAGNOSIS — E782 Mixed hyperlipidemia: Secondary | ICD-10-CM

## 2014-02-19 DIAGNOSIS — R1013 Epigastric pain: Secondary | ICD-10-CM

## 2014-02-19 DIAGNOSIS — R5383 Other fatigue: Secondary | ICD-10-CM

## 2014-02-19 DIAGNOSIS — E039 Hypothyroidism, unspecified: Secondary | ICD-10-CM

## 2014-02-19 DIAGNOSIS — R5381 Other malaise: Secondary | ICD-10-CM

## 2014-02-19 DIAGNOSIS — E559 Vitamin D deficiency, unspecified: Secondary | ICD-10-CM

## 2014-02-19 NOTE — Progress Notes (Signed)
Subjective:    Patient ID: Kimberly Wilcox, female    DOB: 18-Jan-1946, 68 y.o.   MRN: [id number]  HPI Comments: 68 yo WF with epigastric pain, off balance and nausea x several days. She notes unusual itch all over body x several days without new exposures. She has had cramps in feet x 3 -4 weeks. She is eating healthy. She is keeping active. She is more tired. She has been off all medicines x several months. She notes mild discomfort with beef and pork since testing + for Lyme.  WBC             5.3   10/04/2013 HGB            13.4   10/04/2013 HCT            40.6   10/04/2013 PLT             287   10/04/2013 GLUCOSE          85   10/04/2013 CHOL            213   10/04/2013 TRIG             93   10/04/2013 HDL              74   10/04/2013 LDLCALC         120   10/04/2013 ALT              <8   10/04/2013 AST              20   10/04/2013 NA              138   10/04/2013 K               4.0   10/04/2013 CL              103   10/04/2013 CREATININE     0.83   10/04/2013 BUN              10   10/04/2013 CO2              29   10/04/2013 TSH           4.008   10/04/2013 INR             0.9   01/05/2012 HGBA1C          5.5   10/04/2013 MICROALBUR     0.50   10/04/2013   Abdominal Pain  Dizziness Associated symptoms include abdominal pain and fatigue.     Medication List       This list is accurate as of: 02/19/14  3:13 PM.  Always use your most recent med list.               aspirin 81 MG tablet  Take 81 mg by mouth. When pt remebers     nitroGLYCERIN 0.4 MG SL tablet  Commonly known as:  NITROSTAT  Place 1 tablet (0.4 mg total) under the tongue every 5 (five) minutes as needed for chest pain.     omeprazole 20 MG capsule  Commonly known as:  PRILOSEC  Take 20 mg by mouth daily.        Allergies  Allergen Reactions  . Nitrofurantoin Monohyd Macro   . Sulfa Antibiotics     Past Medical History  Diagnosis Date  . GERD (gastroesophageal reflux disease)   . Chest discomfort   .  Hiatal hernia   . Anxiety   . Vitamin D deficiency   . Osteoporosis   . Renal cell cancer 1999    LEFT NEPHRECTOMY  . Hypertension     labile, normal cath 2013     Review of Systems  Constitutional: Positive for fatigue.  Gastrointestinal: Positive for abdominal pain.  Neurological: Positive for dizziness.  All other systems reviewed and are negative.  BP 120/72  Pulse 68  Temp(Src) 98.1 F (36.7 C)  Resp 16  Wt 148 lb 3.2 oz (67.223 kg)     Objective:   Physical Exam  Nursing note and vitals reviewed. Constitutional: She is oriented to person, place, and time. She appears well-developed and well-nourished. No distress.  HENT:  Head: Normocephalic and atraumatic.  Right Ear: External ear normal.  Left Ear: External ear normal.  Nose: Nose normal.  Mouth/Throat: Oropharynx is clear and moist.  Eyes: Conjunctivae and EOM are normal.  Neck: Normal range of motion. Neck supple. No JVD present. No thyromegaly present.  Cardiovascular: Normal rate, regular rhythm, normal heart sounds and intact distal pulses.   Pulmonary/Chest: Effort normal and breath sounds normal.  Abdominal: Soft. Bowel sounds are normal. She exhibits no distension and no mass. There is no tenderness. There is no rebound and no guarding.  Musculoskeletal: Normal range of motion. She exhibits no edema and no tenderness.  Lymphadenopathy:    She has no cervical adenopathy.  Neurological: She is alert and oriented to person, place, and time. No cranial nerve deficit.  Skin: Skin is warm and dry. No rash noted. No erythema. No pallor.  Psychiatric: She has a normal mood and affect. Her behavior is normal. Judgment and thought content normal.          Assessment & Plan:  1. Abdomen pain- Check labs if negative get U/s of Abdomen  2. Hypothyroid- off medicine, check labs  3. Fatigue- check labs, increase activity and H2O   4. Vit D Def- check labs  5. Pruritus?- Check labs  OVER 40 minutes of  exam, counseling, chart review, referral performed

## 2014-02-19 NOTE — Patient Instructions (Addendum)
Hypothyroidism The thyroid is a large gland located in the lower front of your neck. The thyroid gland helps control metabolism. Metabolism is how your body handles food. It controls metabolism with the hormone thyroxine. When this gland is underactive (hypothyroid), it produces too little hormone.  CAUSES These include:   Absence or destruction of thyroid tissue.  Goiter due to iodine deficiency.  Goiter due to medications.  Congenital defects (since birth).  Problems with the pituitary. This causes a lack of TSH (thyroid stimulating hormone). This hormone tells the thyroid to turn out more hormone. SYMPTOMS  Lethargy (feeling as though you have no energy)  Cold intolerance  Weight gain (in spite of normal food intake)  Dry skin  Coarse hair  Menstrual irregularity (if severe, may lead to infertility)  Slowing of thought processes Cardiac problems are also caused by insufficient amounts of thyroid hormone. Hypothyroidism in the newborn is cretinism, and is an extreme form. It is important that this form be treated adequately and immediately or it will lead rapidly to retarded physical and mental development. DIAGNOSIS  To prove hypothyroidism, your caregiver may do blood tests and ultrasound tests. Sometimes the signs are hidden. It may be necessary for your caregiver to watch this illness with blood tests either before or after diagnosis and treatment. TREATMENT  Low levels of thyroid hormone are increased by using synthetic thyroid hormone. This is a safe, effective treatment. It usually takes about four weeks to gain the full effects of the medication. After you have the full effect of the medication, it will generally take another four weeks for problems to leave. Your caregiver may start you on low doses. If you have had heart problems the dose may be gradually increased. It is generally not an emergency to get rapidly to normal. HOME CARE INSTRUCTIONS   Take your  medications as your caregiver suggests. Let your caregiver know of any medications you are taking or start taking. Your caregiver will help you with dosage schedules.  As your condition improves, your dosage needs may increase. It will be necessary to have continuing blood tests as suggested by your caregiver.  Report all suspected medication side effects to your caregiver. SEEK MEDICAL CARE IF: Seek medical care if you develop:  Sweating.  Tremulousness (tremors).  Anxiety.  Rapid weight loss.  Heat intolerance.  Emotional swings.  Diarrhea.  Weakness. SEEK IMMEDIATE MEDICAL CARE IF:  You develop chest pain, an irregular heart beat (palpitations), or a rapid heart beat. MAKE SURE YOU:   Understand these instructions.  Will watch your condition.  Will get help right away if you are not doing well or get worse. Document Released: 08/10/2005 Document Revised: 11/02/2011 Document Reviewed: 03/30/2008 [facility] Patient Information 2015 [place], [place]. This information is not intended to replace advice given to you by your health care Kimberly Wilcox. Make sure you discuss any questions you have with your health care Kimberly Wilcox.   Lyme Disease You may have been bitten by a tick and are to watch for the development of Lyme Disease. Lyme Disease is an infection that is caused by a bacteria The bacteria causing this disease is named Borreilia burgdorferi. If a tick is infected with this bacteria and then bites you, then Lyme Disease may occur. These ticks are carried by deer and rodents such as rabbits and mice and infest grassy as well as forested areas. Fortunately most tick bites do not cause Lyme Disease.  Lyme Disease is easier to prevent than to treat. First,  covering your legs with clothing when walking in areas where ticks are possibly abundant will prevent their attachment because ticks tend to stay within inches of the ground. Second, using insecticides containing DEET can be applied  on skin or clothing. Last, because it takes about 12 to 24 hours for the tick to transmit the disease after attachment to the human host, you should inspect your body for ticks twice a day when you are in areas where Lyme Disease is common. You must look thoroughly when searching for ticks. The Ixodes tick that carries Lyme Disease is very small. It is around the size of a sesame seed (picture of tick is not actual size). Removal is best done by grasping the tick by the head and pulling it out. Do not to squeeze the body of the tick. This could inject the infecting bacteria into the bite site. Wash the area of the bite with an antiseptic solution after removal.  Lyme Disease is a disease that may affect many body systems. Because of the small size of the biting tick, most people do not notice being bitten. The first sign of an infection is usually a round red rash that extends out from the center of the tick bite. The center of the lesion may be blood colored (hemorrhagic) or have tiny blisters (vesicular). Most lesions have bright red outer borders and partial central clearing. This rash may extend out many inches in diameter, and multiple lesions may be present. Other symptoms such as fatigue, headaches, chills and fever, general achiness and swelling of lymph glands may also occur. If this first stage of the disease is left untreated, these symptoms may gradually resolve by themselves, or progressive symptoms may occur because of spread of infection to other areas of the body.  Follow up with your caregiver to have testing and treatment if you have a tick bite and you develop any of the above complaints. Your caregiver may recommend preventative (prophylactic) medications which kill bacteria (antibiotics). Once a diagnosis of Lyme Disease is made, antibiotic treatment is highly likely to cure the disease. Effective treatment of late stage Lyme Disease may require longer courses of antibiotic therapy.  MAKE  SURE YOU:   Understand these instructions.  Will watch your condition.  Will get help right away if you are not doing well or get worse. Document Released: 11/16/2000 Document Revised: 11/02/2011 Document Reviewed: 01/18/2009 [facility] Patient Information 2015 [place], [place]. This information is not intended to replace advice given to you by your health care Kimberly Wilcox. Make sure you discuss any questions you have with your health care Kimberly Wilcox.

## 2014-02-20 LAB — HEPATIC FUNCTION PANEL
ALT: 8 U/L (ref 0–35)
AST: 20 U/L (ref 0–37)
Albumin: 4.4 g/dL (ref 3.5–5.2)
Alkaline Phosphatase: 67 U/L (ref 39–117)
Bilirubin, Direct: 0.1 mg/dL (ref 0.0–0.3)
Indirect Bilirubin: 0.4 mg/dL (ref 0.2–1.2)
Total Bilirubin: 0.5 mg/dL (ref 0.2–1.2)
Total Protein: 6.9 g/dL (ref 6.0–8.3)

## 2014-02-20 LAB — CBC WITH DIFFERENTIAL/PLATELET
Basophils Absolute: 0 10*3/uL (ref 0.0–0.1)
Basophils Relative: 0 % (ref 0–1)
Eosinophils Absolute: 0.1 10*3/uL (ref 0.0–0.7)
Eosinophils Relative: 2 % (ref 0–5)
HCT: 39 % (ref 36.0–46.0)
Hemoglobin: 13 g/dL (ref 12.0–15.0)
Lymphocytes Relative: 37 % (ref 12–46)
Lymphs Abs: 2.7 10*3/uL (ref 0.7–4.0)
MCH: 27.4 pg (ref 26.0–34.0)
MCHC: 33.3 g/dL (ref 30.0–36.0)
MCV: 82.3 fL (ref 78.0–100.0)
Monocytes Absolute: 0.4 10*3/uL (ref 0.1–1.0)
Monocytes Relative: 6 % (ref 3–12)
Neutro Abs: 4 10*3/uL (ref 1.7–7.7)
Neutrophils Relative %: 55 % (ref 43–77)
Platelets: 286 10*3/uL (ref 150–400)
RBC: 4.74 MIL/uL (ref 3.87–5.11)
RDW: 14.6 % (ref 11.5–15.5)
WBC: 7.2 10*3/uL (ref 4.0–10.5)

## 2014-02-20 LAB — AMYLASE: Amylase: 47 U/L (ref 0–105)

## 2014-02-20 LAB — BASIC METABOLIC PANEL WITH GFR
BUN: 12 mg/dL (ref 6–23)
CO2: 29 mEq/L (ref 19–32)
Calcium: 9.4 mg/dL (ref 8.4–10.5)
Chloride: 103 mEq/L (ref 96–112)
Creat: 0.86 mg/dL (ref 0.50–1.10)
GFR, Est African American: 80 mL/min
GFR, Est Non African American: 69 mL/min
Glucose, Bld: 86 mg/dL (ref 70–99)
Potassium: 4.1 mEq/L (ref 3.5–5.3)
Sodium: 139 mEq/L (ref 135–145)

## 2014-02-20 LAB — IRON AND TIBC
%SAT: 22 % (ref 20–55)
Iron: 76 ug/dL (ref 42–145)
TIBC: 350 ug/dL (ref 250–470)
UIBC: 274 ug/dL (ref 125–400)

## 2014-02-20 LAB — LIPID PANEL
Cholesterol: 224 mg/dL — ABNORMAL HIGH (ref 0–200)
HDL: 90 mg/dL (ref >39)
LDL Cholesterol: 116 mg/dL — ABNORMAL HIGH (ref 0–99)
Total CHOL/HDL Ratio: 2.5 Ratio
Triglycerides: 88 mg/dL (ref <150)
VLDL: 18 mg/dL (ref 0–40)

## 2014-02-20 LAB — MAGNESIUM: Magnesium: 1.9 mg/dL (ref 1.5–2.5)

## 2014-02-20 LAB — FOLATE RBC: RBC Folate: 408 ng/mL (ref >280)

## 2014-02-20 LAB — TSH: TSH: 2.473 u[IU]/mL (ref 0.350–4.500)

## 2014-02-20 LAB — VITAMIN B12: Vitamin B-12: 448 pg/mL (ref 211–911)

## 2014-02-20 LAB — LIPASE: Lipase: 25 U/L (ref 0–75)

## 2014-02-20 LAB — VITAMIN D 25 HYDROXY (VIT D DEFICIENCY, FRACTURES): Vit D, 25-Hydroxy: 37 ng/mL (ref 30–89)

## 2014-02-21 ENCOUNTER — Other Ambulatory Visit: Payer: Self-pay | Admitting: Emergency Medicine

## 2014-02-21 DIAGNOSIS — R1013 Epigastric pain: Secondary | ICD-10-CM

## 2014-02-21 NOTE — Progress Notes (Signed)
Scheduled ultrasound

## 2014-02-28 ENCOUNTER — Other Ambulatory Visit: Payer: Self-pay | Admitting: Physician Assistant

## 2014-03-05 ENCOUNTER — Ambulatory Visit
Admission: RE | Admit: 2014-03-05 | Discharge: 2014-03-05 | Disposition: A | Discharge status: Home / Self Care | Payer: BC Managed Care – PPO | Source: Ambulatory Visit | Attending: Emergency Medicine | Admitting: Emergency Medicine

## 2014-03-05 DIAGNOSIS — R1013 Epigastric pain: Secondary | ICD-10-CM

## 2014-03-21 ENCOUNTER — Other Ambulatory Visit: Payer: Self-pay | Admitting: Gastroenterology

## 2014-03-21 DIAGNOSIS — R1310 Dysphagia, unspecified: Secondary | ICD-10-CM

## 2014-03-22 ENCOUNTER — Ambulatory Visit
Admission: RE | Admit: 2014-03-22 | Discharge: 2014-03-22 | Disposition: A | Discharge status: Home / Self Care | Payer: BC Managed Care – PPO | Source: Ambulatory Visit | Attending: Gastroenterology | Admitting: Gastroenterology

## 2014-03-22 DIAGNOSIS — R1310 Dysphagia, unspecified: Secondary | ICD-10-CM

## 2014-03-30 DIAGNOSIS — Z0000 Encounter for general adult medical examination without abnormal findings: Secondary | ICD-10-CM

## 2014-04-04 ENCOUNTER — Encounter: Payer: Self-pay | Admitting: Internal Medicine

## 2014-04-04 ENCOUNTER — Ambulatory Visit: Payer: Self-pay | Admitting: Physician Assistant

## 2014-06-15 ENCOUNTER — Other Ambulatory Visit: Payer: Self-pay | Admitting: Physician Assistant

## 2014-06-15 MED ORDER — CIPROFLOXACIN HCL 500 MG PO TABS: 500.0000 mg | ORAL_TABLET | Freq: Two times a day (BID) | ORAL | Status: AC

## 2014-06-15 NOTE — Progress Notes (Unsigned)
Complaining of urgency, back pain, unable to get into the office to drop off a urine. Will send in Cipro, she understands if her symptoms get worse she needs to go to the ER, if she does not get better she needs an office visit.

## 2014-06-15 NOTE — Progress Notes (Signed)
Patient aware.  Advised ov if no relief with symptoms after finishing abx.

## 2014-10-04 ENCOUNTER — Encounter: Payer: Self-pay | Admitting: Physician Assistant

## 2014-10-08 ENCOUNTER — Encounter: Payer: Self-pay | Admitting: Physician Assistant

## 2014-10-25 ENCOUNTER — Emergency Department (HOSPITAL_COMMUNITY)
Admission: EM | Admit: 2014-10-25 | Discharge: 2014-10-25 | Disposition: A | Discharge status: Home / Self Care | Payer: BC Managed Care – PPO | Attending: Emergency Medicine | Admitting: Emergency Medicine

## 2014-10-25 ENCOUNTER — Encounter (HOSPITAL_COMMUNITY): Payer: Self-pay | Admitting: Emergency Medicine

## 2014-10-25 ENCOUNTER — Emergency Department (HOSPITAL_COMMUNITY): Payer: BC Managed Care – PPO

## 2014-10-25 DIAGNOSIS — Z8659 Personal history of other mental and behavioral disorders: Secondary | ICD-10-CM | POA: Diagnosis not present

## 2014-10-25 DIAGNOSIS — I10 Essential (primary) hypertension: Secondary | ICD-10-CM | POA: Diagnosis not present

## 2014-10-25 DIAGNOSIS — Z87891 Personal history of nicotine dependence: Secondary | ICD-10-CM | POA: Diagnosis not present

## 2014-10-25 DIAGNOSIS — Z7982 Long term (current) use of aspirin: Secondary | ICD-10-CM | POA: Diagnosis not present

## 2014-10-25 DIAGNOSIS — K219 Gastro-esophageal reflux disease without esophagitis: Secondary | ICD-10-CM | POA: Diagnosis not present

## 2014-10-25 DIAGNOSIS — Z79899 Other long term (current) drug therapy: Secondary | ICD-10-CM | POA: Insufficient documentation

## 2014-10-25 DIAGNOSIS — R202 Paresthesia of skin: Secondary | ICD-10-CM | POA: Diagnosis not present

## 2014-10-25 DIAGNOSIS — R52 Pain, unspecified: Secondary | ICD-10-CM | POA: Diagnosis present

## 2014-10-25 DIAGNOSIS — Z8739 Personal history of other diseases of the musculoskeletal system and connective tissue: Secondary | ICD-10-CM | POA: Insufficient documentation

## 2014-10-25 DIAGNOSIS — Z85528 Personal history of other malignant neoplasm of kidney: Secondary | ICD-10-CM | POA: Insufficient documentation

## 2014-10-25 DIAGNOSIS — E559 Vitamin D deficiency, unspecified: Secondary | ICD-10-CM | POA: Insufficient documentation

## 2014-10-25 LAB — CBC
HCT: 38.9 % (ref 36.0–46.0)
Hemoglobin: 12.7 g/dL (ref 12.0–15.0)
MCH: 27.8 pg (ref 26.0–34.0)
MCHC: 32.6 g/dL (ref 30.0–36.0)
MCV: 85.1 fL (ref 78.0–100.0)
Platelets: 246 10*3/uL (ref 150–400)
RBC: 4.57 MIL/uL (ref 3.87–5.11)
RDW: 13.6 % (ref 11.5–15.5)
WBC: 6.3 10*3/uL (ref 4.0–10.5)

## 2014-10-25 LAB — URINALYSIS, ROUTINE W REFLEX MICROSCOPIC
Bilirubin Urine: NEGATIVE
Glucose, UA: NEGATIVE mg/dL
Hgb urine dipstick: NEGATIVE
Ketones, ur: NEGATIVE mg/dL
Leukocytes, UA: NEGATIVE
Nitrite: NEGATIVE
Protein, ur: NEGATIVE mg/dL
Specific Gravity, Urine: 1.017 (ref 1.005–1.030)
Urobilinogen, UA: 1 mg/dL (ref 0.0–1.0)
pH: 7 (ref 5.0–8.0)

## 2014-10-25 LAB — I-STAT TROPONIN, ED: Troponin i, poc: 0 ng/mL (ref 0.00–0.08)

## 2014-10-25 LAB — BASIC METABOLIC PANEL
Anion gap: 9 (ref 5–15)
BUN: 17 mg/dL (ref 6–23)
CO2: 27 mmol/L (ref 19–32)
Calcium: 9.1 mg/dL (ref 8.4–10.5)
Chloride: 104 mmol/L (ref 96–112)
Creatinine, Ser: 0.88 mg/dL (ref 0.50–1.10)
GFR calc Af Amer: 76 mL/min — ABNORMAL LOW (ref >90)
GFR calc non Af Amer: 66 mL/min — ABNORMAL LOW (ref >90)
Glucose, Bld: 108 mg/dL — ABNORMAL HIGH (ref 70–99)
Potassium: 3.8 mmol/L (ref 3.5–5.1)
Sodium: 140 mmol/L (ref 135–145)

## 2014-10-25 MED ORDER — GADOBENATE DIMEGLUMINE 529 MG/ML IV SOLN
15.0000 mL | Freq: Once | INTRAVENOUS | Status: AC | PRN
  Administered 2014-10-25: 13 mL via INTRAVENOUS

## 2014-10-25 NOTE — ED Notes (Signed)
Pt reports being dx with lymes disease 1 year ago, started having left upper knee area pain x1 month, nausea left upper thigh/leg pain, left arm pain, and left sided facial pain/ tingling x1 week. Pain 6/10. Pt reports her vision became worse 3 weeks ago, pt got new glasses 2 months ago.

## 2014-10-25 NOTE — ED Notes (Signed)
Pt returned from MRI °

## 2014-10-25 NOTE — ED Notes (Signed)
Pt c/o left sided pain.  States that she has been having lt leg pain that radiated up to arm x 7 days.  States that her whole body tingles and that she has been having headaches and blurred vision.

## 2014-10-25 NOTE — ED Notes (Signed)
Pt is in MRI  

## 2014-10-25 NOTE — ED Provider Notes (Signed)
CSN: [id number]     Arrival date & time 10/25/14  1412 History   None    Chief Complaint  Patient presents with  . Left side pain      (Consider location/radiation/quality/duration/timing/severity/associated sxs/prior Treatment) HPI   Kimberly Wilcox is a 69 y.o. female complaining of left sided pain and pins and needles paresthesia which have remained constant over the last 5 days. She rates her pain at 5 out of 10, the paresthesia affects the leg, and moved to the arm and also to the face. She denies fever, chills, low back pain, weakness, dysarthria, headache, cervicalgia, chest pain, shortness of breath, abdominal pain, history of DVT or PE, exogenous estrogen, calf pain or swelling. On review of systems she endorses a chronic chest pain which is unchanged, slight ataxia, and nausea.  Past Medical History  Diagnosis Date  . GERD (gastroesophageal reflux disease)   . Chest discomfort   . Hiatal hernia   . Anxiety   . Vitamin D deficiency   . Osteoporosis   . Renal cell cancer 1999    LEFT NEPHRECTOMY  . Hypertension     labile, normal cath 2013   Past Surgical History  Procedure Laterality Date  . Nephrectomy Left 1999    secondary to cancer  . Left oophorectomy  1985    Benign tumor  . Tubal ligation     Family History  Problem Relation Age of Onset  . Pneumonia Father    History  Substance Use Topics  . Smoking status: Former Research scientist (life sciences)  . Smokeless tobacco: Not on file  . Alcohol Use: Not on file   OB History    No data available     Review of Systems  10 systems reviewed and found to be negative, except as noted in the HPI.   Allergies  Nitrofurantoin monohyd macro and Sulfa antibiotics  Home Medications   Prior to Admission medications   Medication Sig Start Date End Date Taking? Authorizing Provider  aspirin 81 MG tablet Take 81 mg by mouth.    Yes Historical Provider, MD  Cholecalciferol (VITAMIN D PO) Take 1 tablet by mouth daily.   Yes  Historical Provider, MD  Cyanocobalamin (VITAMIN B 12 PO) Take 1 tablet by mouth daily.   Yes Historical Provider, MD  omeprazole (PRILOSEC) 20 MG capsule Take 20 mg by mouth daily.   Yes Historical Provider, MD  pantoprazole (PROTONIX) 40 MG tablet take 1 tablet by mouth once daily for GERD/HEART BURN 02/28/14  Yes Vicie Mutters, PA-C  nitroGLYCERIN (NITROSTAT) 0.4 MG SL tablet Place 1 tablet (0.4 mg total) under the tongue every 5 (five) minutes as needed for chest pain. 01/05/12 01/04/13  Josue Hector, MD   BP 135/75 mmHg  Pulse 87  Temp(Src) 98.4 F (36.9 C) (Oral)  Resp 15  SpO2 97% Physical Exam  Constitutional: She is oriented to person, place, and time. She appears well-developed and well-nourished. No distress.  HENT:  Head: Normocephalic and atraumatic.  Mouth/Throat: Oropharynx is clear and moist.  Eyes: Conjunctivae and EOM are normal. Pupils are equal, round, and reactive to light.  Neck: Normal range of motion. Neck supple.  Cardiovascular: Normal rate, regular rhythm and intact distal pulses.   Pulmonary/Chest: Effort normal and breath sounds normal. No stridor.  Abdominal: Soft. Bowel sounds are normal. She exhibits no distension.  Musculoskeletal: Normal range of motion.  No calf asymmetry, superficial collaterals, palpable cords, edema, Homans sign negative bilaterally.    Neurological: She is  alert and oriented to person, place, and time.  II-Visual fields grossly intact. III/IV/VI-Extraocular movements intact.  Pupils reactive bilaterally. V/VII-Smile symmetric, equal eyebrow raise,  facial sensation intact VIII- Hearing grossly intact IX/X-Normal gag XI-bilateral shoulder shrug XII-midline tongue extension Motor: 5/5 bilaterally with normal tone and bulk Cerebellar: Normal finger-to-nose  and normal heel-to-shin test.   Romberg negative Ambulates with a coordinated gait   Psychiatric: She has a normal mood and affect.  Nursing note and vitals  reviewed.   ED Course  Procedures (including critical care time) Labs Review Labs Reviewed  [place], ED    Imaging Review No results found.   EKG Interpretation   Date/Time:  Thursday October 25 2014 14:21:46 EST Ventricular Rate:  82 PR Interval:  138 QRS Duration: 78 QT Interval:  367 QTC Calculation: 429 R Axis:   69 Text Interpretation:  Sinus rhythm Baseline wander in lead(s) II III aVR  aVF No significant change since last tracing Confirmed by WARD,  DO,  KRISTEN [phone]) on 10/25/2014 2:34:12 PM      MDM   Final diagnoses:  Paresthesia  Pain   Filed Vitals:   10/25/14 1455 10/25/14 1525 10/25/14 1600 10/25/14 1705  BP:  137/96 129/59 128/85  Pulse: 72 79  73  Temp:    97.8 F (36.6 C)  TempSrc:    Oral  Resp: 18 14 15 15   SpO2: 100% 97%  97%    Medications  gadobenate dimeglumine (MULTIHANCE) injection 15 mL (13 mLs Intravenous Contrast Given 10/25/14 1929)    Kimberly Wilcox is a pleasant 69 y.o. female presenting with left-sided pain to upper and lower extremity and pins and needles paresthesia to these extremities in addition to the left side of the face. Neuro exam is nonfocal. MRI with and without contrast is ordered after consultation with attending physician. MRIs determined to be negative. Discussed follow-up with primary care physician. She has declined pain medication in the ED on multiple occasions. Patient is concerned about a DVT, however reassured this is not likely.  This is a shared visit with the attending physician who personally evaluated the patient and agrees with the care plan.   Evaluation does not show pathology that would require ongoing emergent intervention or inpatient treatment. Pt is hemodynamically stable and mentating appropriately. Discussed findings and plan with patient/guardian, who agrees with care plan. All questions answered. Return precautions discussed and outpatient follow up  given.    Monico Blitz, PA-C 10/26/14 0006  Wandra Arthurs, MD 10/29/14 [phone]

## 2014-10-25 NOTE — ED Notes (Signed)
md at bedside  Pt alert and oriented x4. Respirations even and unlabored, bilateral symmetrical rise and fall of chest. Skin warm and dry. In no acute distress. Denies needs.   

## 2014-10-25 NOTE — ED Notes (Signed)
Delay with MRI, estimated time 1830

## 2014-10-25 NOTE — Discharge Instructions (Signed)
Please follow with your primary care doctor in the next 2 days for a check-up. They must obtain records for further management.  ° °Do not hesitate to return to the Emergency Department for any new, worsening or concerning symptoms.  ° °

## 2014-10-29 ENCOUNTER — Ambulatory Visit (INDEPENDENT_AMBULATORY_CARE_PROVIDER_SITE_OTHER): Payer: BC Managed Care – PPO | Admitting: Physician Assistant

## 2014-10-29 ENCOUNTER — Encounter: Payer: Self-pay | Admitting: Physician Assistant

## 2014-10-29 VITALS — BP 138/86 | HR 76 | Temp 98.0°F | Resp 18 | Ht 61.0 in | Wt 147.0 lb

## 2014-10-29 DIAGNOSIS — M810 Age-related osteoporosis without current pathological fracture: Secondary | ICD-10-CM

## 2014-10-29 DIAGNOSIS — R6889 Other general symptoms and signs: Secondary | ICD-10-CM

## 2014-10-29 DIAGNOSIS — R51 Headache: Secondary | ICD-10-CM

## 2014-10-29 DIAGNOSIS — D649 Anemia, unspecified: Secondary | ICD-10-CM

## 2014-10-29 DIAGNOSIS — I10 Essential (primary) hypertension: Secondary | ICD-10-CM

## 2014-10-29 DIAGNOSIS — Z85528 Personal history of other malignant neoplasm of kidney: Secondary | ICD-10-CM

## 2014-10-29 DIAGNOSIS — E785 Hyperlipidemia, unspecified: Secondary | ICD-10-CM

## 2014-10-29 DIAGNOSIS — R350 Frequency of micturition: Secondary | ICD-10-CM

## 2014-10-29 DIAGNOSIS — K449 Diaphragmatic hernia without obstruction or gangrene: Secondary | ICD-10-CM

## 2014-10-29 DIAGNOSIS — M26609 Unspecified temporomandibular joint disorder, unspecified side: Secondary | ICD-10-CM

## 2014-10-29 DIAGNOSIS — M791 Myalgia, unspecified site: Secondary | ICD-10-CM

## 2014-10-29 DIAGNOSIS — R7309 Other abnormal glucose: Secondary | ICD-10-CM | POA: Insufficient documentation

## 2014-10-29 DIAGNOSIS — R0989 Other specified symptoms and signs involving the circulatory and respiratory systems: Secondary | ICD-10-CM

## 2014-10-29 DIAGNOSIS — Z113 Encounter for screening for infections with a predominantly sexual mode of transmission: Secondary | ICD-10-CM

## 2014-10-29 DIAGNOSIS — R7303 Prediabetes: Secondary | ICD-10-CM

## 2014-10-29 DIAGNOSIS — R1013 Epigastric pain: Secondary | ICD-10-CM

## 2014-10-29 DIAGNOSIS — Z0001 Encounter for general adult medical examination with abnormal findings: Secondary | ICD-10-CM

## 2014-10-29 DIAGNOSIS — R519 Headache, unspecified: Secondary | ICD-10-CM

## 2014-10-29 DIAGNOSIS — K2100 Gastro-esophageal reflux disease with esophagitis, without bleeding: Secondary | ICD-10-CM

## 2014-10-29 DIAGNOSIS — E782 Mixed hyperlipidemia: Secondary | ICD-10-CM | POA: Insufficient documentation

## 2014-10-29 DIAGNOSIS — R29898 Other symptoms and signs involving the musculoskeletal system: Secondary | ICD-10-CM

## 2014-10-29 DIAGNOSIS — E559 Vitamin D deficiency, unspecified: Secondary | ICD-10-CM

## 2014-10-29 DIAGNOSIS — F419 Anxiety disorder, unspecified: Secondary | ICD-10-CM

## 2014-10-29 LAB — CBC WITH DIFFERENTIAL/PLATELET
Basophils Absolute: 0 10*3/uL (ref 0.0–0.1)
Basophils Relative: 0 % (ref 0–1)
Eosinophils Absolute: 0.1 10*3/uL (ref 0.0–0.7)
Eosinophils Relative: 2 % (ref 0–5)
HCT: 39.8 % (ref 36.0–46.0)
Hemoglobin: 12.7 g/dL (ref 12.0–15.0)
Lymphocytes Relative: 39 % (ref 12–46)
Lymphs Abs: 2.5 10*3/uL (ref 0.7–4.0)
MCH: 26.7 pg (ref 26.0–34.0)
MCHC: 31.9 g/dL (ref 30.0–36.0)
MCV: 83.6 fL (ref 78.0–100.0)
MPV: 9.6 fL (ref 8.6–12.4)
Monocytes Absolute: 0.4 10*3/uL (ref 0.1–1.0)
Monocytes Relative: 7 % (ref 3–12)
Neutro Abs: 3.3 10*3/uL (ref 1.7–7.7)
Neutrophils Relative %: 52 % (ref 43–77)
Platelets: 257 10*3/uL (ref 150–400)
RBC: 4.76 MIL/uL (ref 3.87–5.11)
RDW: 14 % (ref 11.5–15.5)
WBC: 6.4 10*3/uL (ref 4.0–10.5)

## 2014-10-29 MED ORDER — SERTRALINE HCL 50 MG PO TABS: ORAL_TABLET | ORAL | Status: DC

## 2014-10-29 MED ORDER — OMEPRAZOLE 20 MG PO CPDR: 20.0000 mg | DELAYED_RELEASE_CAPSULE | Freq: Every day | ORAL | Status: DC

## 2014-10-29 MED ORDER — DIAZEPAM 2 MG PO TABS: ORAL_TABLET | ORAL | Status: DC

## 2014-10-29 NOTE — Progress Notes (Signed)
Complete Physical  Assessment and Plan: Osteoporosis- get DEXA, continue Ca, Vit D, and weight bearing exercises.  HTN- controlled diet hyperlipidemia-continue medications, check lipids, decrease fatty foods, increase activity.  GERD- Continue PPI/H2 blocker, diet discussed PreDM- Discussed general issues about diabetes pathophysiology and management., Educational material distributed., Suggested low cholesterol diet., Encouraged aerobic exercise., Discussed foot care., Reminded to get yearly retinal exam. Obesity with co morbidities- long discussion about weight loss, diet, and exercise Vitamin D def- Continue supplement HX L RCC- continue follow up Dr. Tresa Moore Abdominal pain? Constipation, anxiety, GERD, Nerve/muscular- will try anxiety med  TMJ-information given to the patient, no gum/decrease hard foods, warm wet wash clothes, decrease stress, talk with dentist about possible night guard, can do massage, and exercise.  Myalgias- check CPK,lyme-? FM Headache- normal MRI- check ESR rule out TA, versus TMJ Anxiety-? Cause of multitude of symptoms, patient very reluctant to start medications in general. Has been on celexa in the past with weight gain, and has tried xanax Urinary frequency- check UTI ? DDD lumbar/spinal stenosis/compression from osteo- check lumbar xray  OVER 40 minutes of exam, counseling, chart review, referral performed in addition to the CPE.  Discussed med's effects and SE's. Screening labs and tests as requested with regular follow-up as recommended.  HPI 69 y.o. female  presents for a complete physical. Her blood pressure has been controlled at home, today their BP is BP: 138/86 mmHg She denies chest pain, shortness of breath, dizziness.  Her cholesterol is diet controlled. Her cholesterol is controlled. The cholesterol last visit was: Lab Results  Component Value Date   CHOL 224* 02/19/2014   HDL 90 02/19/2014   LDLCALC 116* 02/19/2014   TRIG 88 02/19/2014    CHOLHDL 2.5 02/19/2014   She has been working on diet and exercise for prediabetes, and denies blurry vision, polydipsia, polyphagia and polyuria. Last A1C in the office was:  Lab Results  Component Value Date   HGBA1C 5.5 10/04/2013   Patient is on Vitamin D supplement.  She has anxiety.  She has GERD and takes protonix PRN.  She was recently seen in the ER for tingling in left face, left leg pain/arms, had a normal MRI in the ER. She states that while walking in the store today she feels shakey/jerky feeling in her legs/arms and , it is worse when she walks but can also be nonexertional/sitting. She is having HA's and feeling shakey. She states she was treated for lyme over a year ago.  States she has had an intermittent discomfort for years that has been continuing, worse in the past week. Worse at rest/night, feels like a vibrations/spasm, runs from suprapubic to epigastric area, will keep her up at night. No other accompaniments with it, has taken flexeril without help.  She has seen a kidney specialist, had had CT AB normal, has had normal cath with Dr. Johnsie Cancel. States it may be worse with anxiety.  Last TSH was 4.0, she was started on thyroid but stopped it, in June she was not on the medication.   Lab Results  Component Value Date   TSH 2.473 02/19/2014    Current Medications:  Current Outpatient Prescriptions on File Prior to Visit  Medication Sig Dispense Refill  . aspirin 81 MG tablet Take 81 mg by mouth.     . Cholecalciferol (VITAMIN D PO) Take 1 tablet by mouth daily.    . Cyanocobalamin (VITAMIN B 12 PO) Take 1 tablet by mouth daily.    . nitroGLYCERIN (NITROSTAT)  0.4 MG SL tablet Place 1 tablet (0.4 mg total) under the tongue every 5 (five) minutes as needed for chest pain. 25 tablet 4  . pantoprazole (PROTONIX) 40 MG tablet take 1 tablet by mouth once daily for GERD/HEART BURN (Patient taking differently: take 1 tablet by mouth once daily prn for GERD/HEART BURN) 30 tablet 5    No current facility-administered medications on file prior to visit.   Health Maintenance:  Immunization History  Administered Date(s) Administered  . Pneumococcal Polysaccharide-23 10/04/2012  . Td 10/04/2009   Tetanus: 2011 Pneumovax: 2014 Prevnar 13: DUE but declines for now Flu vaccine: declines Zostavax: declines for now, will check price Pap: 2011 never abnormal pap MGM: 11/03/2013 DEXA: 2011 Osteoporsis, due for DEXA Colonoscopy: 2010 normal ([facility]) due 2020 EGD: N/A CXR 02/2014 thyroid BX normal 2013  Normal MRI head 04/2013 Normal CT AB 05/2013 Carotid U/S RICA neg, LICA <%38 Cath 1017 normal  Dr. Katy Fitch DEE 09/2013 + cataracts Patient Care Team: Unk Pinto, MD as PCP - General (Internal Medicine) Warden Fillers, MD as Consulting Physician (Optometry) Josue Hector, MD as Consulting Physician (Cardiology) Phebe Colla, MD as Consulting Physician (Urology) Teena Irani, MD as Consulting Physician (Gastroenterology)  Allergies:  Allergies  Allergen Reactions  . Nitrofurantoin Monohyd Macro     "flu like" symptoms   . Sulfa Antibiotics Hives   Medical History:  Past Medical History  Diagnosis Date  . GERD (gastroesophageal reflux disease)   . Chest discomfort   . Hiatal hernia   . Anxiety   . Vitamin D deficiency   . Osteoporosis   . Renal cell cancer 1999    LEFT NEPHRECTOMY  . Hypertension     labile, normal cath 2013   Surgical History:  Past Surgical History  Procedure Laterality Date  . Nephrectomy Left 1999    secondary to cancer  . Left oophorectomy  1985    Benign tumor  . Tubal ligation     Family History:  Family History  Problem Relation Age of Onset  . Pneumonia Father    Social History:  History   Social History  . Marital Status: Married    Spouse Name: N/A  . Number of Children: N/A  . Years of Education: N/A   Occupational History  . Not on file.   Social History Main Topics  . Smoking status: Former Smoker     Quit date: 10/28/1984  . Smokeless tobacco: Not on file  . Alcohol Use: Not on file  . Drug Use: Not on file  . Sexual Activity: Not on file   Other Topics Concern  . Not on file   Social History Narrative   Review of Systems  Constitutional: Negative.   HENT: Positive for ear pain. Negative for congestion, ear discharge, hearing loss, nosebleeds, sore throat and tinnitus.   Eyes: Positive for blurred vision. Negative for double vision, photophobia, pain, discharge and redness.  Respiratory: Negative.  Negative for stridor.   Cardiovascular: Negative.   Gastrointestinal: Positive for heartburn, nausea, abdominal pain and constipation. Negative for vomiting, diarrhea, blood in stool and melena.  Genitourinary: Positive for frequency. Negative for dysuria, urgency, hematuria and flank pain.  Musculoskeletal: Positive for myalgias and back pain. Negative for joint pain, falls and neck pain.  Neurological: Positive for tingling, sensory change and headaches. Negative for dizziness, tremors, speech change, focal weakness, seizures and loss of consciousness.     Physical Exam: Estimated body mass index is 27.79 kg/(m^2) as calculated from the  following:   Height as of this encounter: '5\' 1"'  (1.549 m).   Weight as of this encounter: 147 lb (66.679 kg). Filed Vitals:   10/29/14 1545  BP: 138/86  Pulse: 76  Temp: 98 F (36.7 C)  Resp: 18   General Appearance: Well nourished, in no apparent distress. Eyes: PERRLA, EOMs, conjunctiva no swelling or erythema, normal fundi and vessels. Sinuses: No Frontal/maxillary tenderness ENT/Mouth: Ext aud canals clear, normal light reflex with TMs without erythema, bulging.  Dentures. No erythema, swelling, or exudate on post pharynx. Tonsils not swollen or erythematous. Hearing normal. + TMJ bilateral Neck: Supple, thyroid normal. No bruits Respiratory: Respiratory effort normal, BS equal bilaterally without rales, rhonchi, wheezing or  stridor. Cardio: RRR without murmurs, rubs or gallops. Brisk peripheral pulses without edema.  Chest: symmetric, with normal excursions and percussion. Breasts: defer Abdomen: Soft, +BS. Epigastric tender, no guarding, rebound, hernias, masses, or organomegaly. .  Lymphatics: Non tender without lymphadenopathy.  Genitourinary: defer Musculoskeletal: Full ROM all peripheral extremities,5/5 strength, and normal gait. Skin: Warm, dry without rashes, lesions, ecchymosis.  Neuro: Cranial nerves intact, reflexes decreased bilateral legs. Normal muscle tone, no cerebellar symptoms. Sensation decreased bilateral feet.  Psych: Awake and oriented X 3, normal affect, Insight and Judgment appropriate.   EKG: had done at the ER, reviewed, no changes   Vicie Mutters 4:14 PM

## 2014-10-29 NOTE — Patient Instructions (Signed)
Stress and Stress Management Stress is a normal reaction to life events. It is what you feel when life demands more than you are used to or more than you can handle. Some stress can be useful. For example, the stress reaction can help you catch the last bus of the day, study for a test, or meet a deadline at work. But stress that occurs too often or for too long can cause problems. It can affect your emotional health and interfere with relationships and normal daily activities. Too much stress can weaken your immune system and increase your risk for physical illness. If you already have a medical problem, stress can make it worse. CAUSES  All sorts of life events may cause stress. An event that causes stress for one person may not be stressful for another person. Major life events commonly cause stress. These may be positive or negative. Examples include losing your job, moving into a new home, getting married, having a baby, or losing a loved one. Less obvious life events may also cause stress, especially if they occur day after day or in combination. Examples include working long hours, driving in traffic, caring for children, being in debt, or being in a difficult relationship. SIGNS AND SYMPTOMS Stress may cause emotional symptoms including, the following:  Anxiety. This is feeling worried, afraid, on edge, overwhelmed, or out of control.  Anger. This is feeling irritated or impatient.  Depression. This is feeling sad, down, helpless, or guilty.  Difficulty focusing, remembering, or making decisions. Stress may cause physical symptoms, including the following:   Aches and pains. These may affect your head, neck, back, stomach, or other areas of your body.  Tight muscles or clenched jaw.  Low energy or trouble sleeping. Stress may cause unhealthy behaviors, including the following:   Eating to feel better (overeating) or skipping meals.  Sleeping too little, too much, or both.  Working  too much or putting off tasks (procrastination).  Smoking, drinking alcohol, or using drugs to feel better. DIAGNOSIS  Stress is diagnosed through an assessment by your health care provider. Your health care provider will ask questions about your symptoms and any stressful life events.Your health care provider will also ask about your medical history and may order blood tests or other tests. Certain medical conditions and medicine can cause physical symptoms similar to stress. Mental illness can cause emotional symptoms and unhealthy behaviors similar to stress. Your health care provider may refer you to a mental health professional for further evaluation.  TREATMENT  Stress management is the recommended treatment for stress.The goals of stress management are reducing stressful life events and coping with stress in healthy ways.  Techniques for reducing stressful life events include the following:  Stress identification. Self-monitor for stress and identify what causes stress for you. These skills may help you to avoid some stressful events.  Time management. Set your priorities, keep a calendar of events, and learn to say "no." These tools can help you avoid making too many commitments. Techniques for coping with stress include the following:  Rethinking the problem. Try to think realistically about stressful events rather than ignoring them or overreacting. Try to find the positives in a stressful situation rather than focusing on the negatives.  Exercise. Physical exercise can release both physical and emotional tension. The key is to find a form of exercise you enjoy and do it regularly.  Relaxation techniques. These relax the body and mind. Examples include yoga, meditation, tai chi, biofeedback, deep  breathing, progressive muscle relaxation, listening to music, being out in nature, journaling, and other hobbies. Again, the key is to find one or more that you enjoy and can do  regularly.  Healthy lifestyle. Eat a balanced diet, get plenty of sleep, and do not smoke. Avoid using alcohol or drugs to relax.  Strong support network. Spend time with family, friends, or other people you enjoy being around.Express your feelings and talk things over with someone you trust. Counseling or talktherapy with a mental health professional may be helpful if you are having difficulty managing stress on your own. Medicine is typically not recommended for the treatment of stress.Talk to your health care provider if you think you need medicine for symptoms of stress. HOME CARE INSTRUCTIONS  Keep all follow-up visits as directed by your health care provider.  Take all medicines as directed by your health care provider. SEEK MEDICAL CARE IF:  Your symptoms get worse or you start having new symptoms.  You feel overwhelmed by your problems and can no longer manage them on your own. SEEK IMMEDIATE MEDICAL CARE IF:  You feel like hurting yourself or someone else. Document Released: 02/03/2001 Document Revised: 12/25/2013 Document Reviewed: 04/04/2013 [facility] Patient Information 2015 [place], [place]. This information is not intended to replace advice given to you by your health care provider. Make sure you discuss any questions you have with your health care provider.  What is the TMJ? The temporomandibular (tem-PUH-ro-man-DIB-yoo-ler) joint, or the TMJ, connects the upper and lower jawbones. This joint allows the jaw to open wide and move back and forth when you chew, talk, or yawn.There are also several muscles that help this joint move. There can be muscle tightness and pain in the muscle that can cause several symptoms.  What causes TMJ pain? There are many causes of TMJ pain. Repeated chewing (for example, chewing gum) and clenching your teeth can cause pain in the joint. Some TMJ pain has no obvious cause. What can I do to ease the pain? There are many things you can do to help  your pain get better. When you have pain:  Eat soft foods and stay away from chewy foods (for example, taffy) Try to use both sides of your mouth to chew Don't chew gum Massage Don't open your mouth wide (for example, during yawning or singing) Don't bite your cheeks or fingernails Lower your amount of stress and worry Applying a warm, damp washcloth to the joint may help. Over-the-counter pain medicines such as ibuprofen (one brand: Advil) or acetaminophen (one brand: Tylenol) might also help. Do not use these medicines if you are allergic to them or if your doctor told you not to use them. How can I stop the pain from coming back? When your pain is better, you can do these exercises to make your muscles stronger and to keep the pain from coming back:  Resisted mouth opening: Place your thumb or two fingers under your chin and open your mouth slowly, pushing up lightly on your chin with your thumb. Hold for three to six seconds. Close your mouth slowly. Resisted mouth closing: Place your thumbs under your chin and your two index fingers on the ridge between your mouth and the bottom of your chin. Push down lightly on your chin as you close your mouth. Tongue up: Slowly open and close your mouth while keeping the tongue touching the roof of the mouth. Side-to-side jaw movement: Place an object about one fourth of an inch thick (for  example, two tongue depressors) between your front teeth. Slowly move your jaw from side to side. Increase the thickness of the object as the exercise becomes easier Forward jaw movement: Place an object about one fourth of an inch thick between your front teeth and move the bottom jaw forward so that the bottom teeth are in front of the top teeth. Increase the thickness of the object as the exercise becomes easier. These exercises should not be painful. If it hurts to do these exercises, stop doing them and talk to your family doctor.     

## 2014-10-30 LAB — HEPATIC FUNCTION PANEL
ALT: 8 U/L (ref 0–35)
AST: 23 U/L (ref 0–37)
Albumin: 4.4 g/dL (ref 3.5–5.2)
Alkaline Phosphatase: 62 U/L (ref 39–117)
Bilirubin, Direct: 0.1 mg/dL (ref 0.0–0.3)
Indirect Bilirubin: 0.4 mg/dL (ref 0.2–1.2)
Total Bilirubin: 0.5 mg/dL (ref 0.2–1.2)
Total Protein: 7.2 g/dL (ref 6.0–8.3)

## 2014-10-30 LAB — FERRITIN: Ferritin: 31 ng/mL (ref 10–291)

## 2014-10-30 LAB — TSH: TSH: 2.715 u[IU]/mL (ref 0.350–4.500)

## 2014-10-30 LAB — VITAMIN B12: Vitamin B-12: 626 pg/mL (ref 211–911)

## 2014-10-30 LAB — LIPID PANEL
Cholesterol: 206 mg/dL — ABNORMAL HIGH (ref 0–200)
HDL: 91 mg/dL (ref >=46)
LDL Cholesterol: 102 mg/dL — ABNORMAL HIGH (ref 0–99)
Total CHOL/HDL Ratio: 2.3 Ratio
Triglycerides: 65 mg/dL (ref <150)
VLDL: 13 mg/dL (ref 0–40)

## 2014-10-30 LAB — BASIC METABOLIC PANEL WITH GFR
BUN: 10 mg/dL (ref 6–23)
CO2: 27 mEq/L (ref 19–32)
Calcium: 9.5 mg/dL (ref 8.4–10.5)
Chloride: 102 mEq/L (ref 96–112)
Creat: 0.8 mg/dL (ref 0.50–1.10)
GFR, Est African American: 87 mL/min
GFR, Est Non African American: 75 mL/min
Glucose, Bld: 85 mg/dL (ref 70–99)
Potassium: 4.1 mEq/L (ref 3.5–5.3)
Sodium: 138 mEq/L (ref 135–145)

## 2014-10-30 LAB — URINE CULTURE
Colony Count: NO GROWTH
Organism ID, Bacteria: NO GROWTH

## 2014-10-30 LAB — URINALYSIS, ROUTINE W REFLEX MICROSCOPIC
Bilirubin Urine: NEGATIVE
Glucose, UA: NEGATIVE mg/dL
Hgb urine dipstick: NEGATIVE
Ketones, ur: NEGATIVE mg/dL
Leukocytes, UA: NEGATIVE
Nitrite: NEGATIVE
Protein, ur: NEGATIVE mg/dL
Specific Gravity, Urine: 1.006 (ref 1.005–1.030)
Urobilinogen, UA: 0.2 mg/dL (ref 0.0–1.0)
pH: 6.5 (ref 5.0–8.0)

## 2014-10-30 LAB — IRON AND TIBC
%SAT: 22 % (ref 20–55)
Iron: 68 ug/dL (ref 42–145)
TIBC: 311 ug/dL (ref 250–470)
UIBC: 243 ug/dL (ref 125–400)

## 2014-10-30 LAB — HEMOGLOBIN A1C
Hgb A1c MFr Bld: 6 % — ABNORMAL HIGH (ref <5.7)
Mean Plasma Glucose: 126 mg/dL — ABNORMAL HIGH (ref <117)

## 2014-10-30 LAB — RPR

## 2014-10-30 LAB — MICROALBUMIN / CREATININE URINE RATIO
Creatinine, Urine: 45.1 mg/dL
Microalb, Ur: <0.2 mg/dL (ref <2.0)

## 2014-10-30 LAB — HIV ANTIBODY (ROUTINE TESTING W REFLEX): HIV 1&2 Ab, 4th Generation: NONREACTIVE

## 2014-10-30 LAB — MAGNESIUM: Magnesium: 2 mg/dL (ref 1.5–2.5)

## 2014-10-30 LAB — CK: Total CK: 56 U/L (ref 7–177)

## 2014-10-30 LAB — SEDIMENTATION RATE: Sed Rate: 6 mm/hr (ref 0–30)

## 2014-10-30 LAB — HEPATITIS C ANTIBODY: HCV Ab: NEGATIVE

## 2014-10-30 LAB — VITAMIN D 25 HYDROXY (VIT D DEFICIENCY, FRACTURES): Vit D, 25-Hydroxy: 52 ng/mL (ref 30–100)

## 2014-10-31 ENCOUNTER — Other Ambulatory Visit: Payer: Self-pay

## 2014-10-31 DIAGNOSIS — Z1231 Encounter for screening mammogram for malignant neoplasm of breast: Secondary | ICD-10-CM

## 2014-10-31 LAB — LYME ABY, WSTRN BLT IGG & IGM W/BANDS

## 2014-11-01 ENCOUNTER — Encounter: Payer: Self-pay | Admitting: Physician Assistant

## 2014-11-01 ENCOUNTER — Ambulatory Visit (INDEPENDENT_AMBULATORY_CARE_PROVIDER_SITE_OTHER): Payer: BC Managed Care – PPO | Admitting: Physician Assistant

## 2014-11-01 VITALS — BP 132/72 | HR 72 | Temp 97.7°F | Resp 16 | Ht 61.0 in | Wt 147.0 lb

## 2014-11-01 DIAGNOSIS — R42 Dizziness and giddiness: Secondary | ICD-10-CM

## 2014-11-01 DIAGNOSIS — R200 Anesthesia of skin: Secondary | ICD-10-CM

## 2014-11-01 DIAGNOSIS — R413 Other amnesia: Secondary | ICD-10-CM

## 2014-11-01 DIAGNOSIS — R519 Headache, unspecified: Secondary | ICD-10-CM

## 2014-11-01 DIAGNOSIS — R208 Other disturbances of skin sensation: Secondary | ICD-10-CM

## 2014-11-01 DIAGNOSIS — R51 Headache: Secondary | ICD-10-CM

## 2014-11-01 LAB — ROCKY MTN SPOTTED FVR ABS PNL(IGG+IGM)
RMSF IgG: 0.16 IV
RMSF IgM: 0.67 IV

## 2014-11-01 MED ORDER — MECLIZINE HCL 12.5 MG PO TABS: 12.5000 mg | ORAL_TABLET | Freq: Three times a day (TID) | ORAL | Status: DC | PRN

## 2014-11-01 NOTE — Patient Instructions (Signed)
Benign Paroxysmal Positional Vertigo (BPPV)  General Information In Benign Paroxysmal Positional Vertigo (BPPV) dizziness is generally thought to be due to debris which has collected within a part of the inner ear. This debris can be thought of as "ear rocks", although the formal name is "otoconia". Ear rocks are small crystals of calcium carbonate derived from a structure in the ear called the "utricle" (figure to the right ). The symptoms of BPPV include dizziness or vertigo, lightheadedness, imbalance, and nausea. Activities which bring on symptoms will vary among persons, but symptoms are usually followed by a change of position of the head like getting out of bed or rolling over in bed are common "problem" motions .  However if you have worsening HA, changes vision/speech, weakness go to the ER     What can be done? 1) Use two or more pillows at night. Avoid sleeping on the "bad" side. In the morning, get up slowly and sit on the edge of the bed for a minute. 2) Medication prescribed by your doctor.  3) The exercises below, you can do at home to help you prevent the sensation later in the day.  4) We can refer you to physical therapy at [facility], # [phone]  Home treatments: The Brandt-Daroff Exercises are a home method of treating BPPV,and are effective 95% of the time.  These exercises are performed in three sets per day for two weeks. In each set, one performs the maneuver as shown five times. Start sitting upright (position 1). Then move into the side-lying position (position 2), with the head angled upward about halfway. An easy way to remember this is to imagine someone standing about 6 feet in front of you, and just keep looking at their head at all times. Stay in the side-lying position for 30 seconds, or until the dizziness subsides if this is longer, then go back to the sitting position (position 3). Stay there for 30 seconds, and then go to the opposite side  (position 4) and follow the same routine.  At home Epley Maneuver This procedure seems to be even more effective than the in-office procedure, perhaps because it is repeated every night for a week.  The method (for the left side) is performed as shown on the figure below.  1) One stays in each of the supine (lying down) positions for 30 seconds, and in the sitting upright position (top) for 1 minute.  2) Thus, once cycle takes 2 1/2 minutes.  3) Typically 3 cycles are performed just prior to going to sleep.  4) It is best to do them at night rather than in the morning or midday, as if one becomes dizzy following the exercises, then it can resolve while one is sleeping.  The mirror image of this procedure is used for the right ear.  

## 2014-11-01 NOTE — Progress Notes (Signed)
Assessment and Plan: Dizziness- ? Vertigo ( patient does have a history)/vasovagal/seizure- did have questionable post ictal- may need EEG- had normal MRI- will refer to neuro, give exercises for vertigo and will try the valium first and if this does not help can try the vertgio.  Memory impairment- patient seems confused about medications prescribed and does not recall discussion that we had- ? Dementia- will refer to neuro for evaluation Leg weakness-? Spinal stenosis- wants to see neuro before MRI of lumbar.  Headache- likely from ill fitting dentures/TMJ- normal ESR, normal neuro, and normal MRI- f/u dentist.   HPI 69 y.o.female presents for follow up from possible AE. She was seen for a CPE on 10/29/2014. At that visit she had a mutlitude of symptoms, including GERD, abdominal pain, constipation, headache, myalgias and leg weakness. She has had a normal brain MRI 10/25/2014,  CT AB 06/16/2013, [place] AB 03/05/2014, and normal labs including B12, iron, ferritin, RPR, sed rate, CPK, lyme, RMSF, HIV, hep C. She admitted to being very anxious and having been on celexa and xanax in the past. She was very reluctant to start meds but was started on zoloft 53m and was suppose to take 1/2 pill a day and was going to try valium 2107m1/2 pill for anxiety/HA/muscle pain. She took a 1/2 of the zoloft in the AM and 1/2 in the PM and never picked up the valium.   Daughter is a [employer] scientistnd is here with her. Patient states that last night she did not sleep well, got up out of bed to go to work early, fixed coffee and as she sat down to drink coffee she felt an impending doom/knew something bad was going to happen, she started to try to get her phone and started to fall, she had nausea with dry heaving, diaphoresis, SOB. She states afterwards she could hear her husband but could not answer him, and did have some confusion afterwards. Felt better with head on a pillow, then after that her arms went numb. EMS arrived, had  normal sugar, normal EKG, and had questionable orthostatics/vasovagal.  Denies bladder incontinence, CP. + [employer]n the left.   Past Medical History  Diagnosis Date  . GERD (gastroesophageal reflux disease)   . Chest discomfort   . Hiatal hernia   . Anxiety   . Vitamin D deficiency   . Osteoporosis   . Renal cell cancer 1999    LEFT NEPHRECTOMY  . Hypertension     labile, normal cath 2013     Allergies  Allergen Reactions  . Nitrofurantoin Monohyd Macro     "flu like" symptoms   . Sulfa Antibiotics Hives      Current Outpatient Prescriptions on File Prior to Visit  Medication Sig Dispense Refill  . aspirin 81 MG tablet Take 81 mg by mouth.     . Cholecalciferol (VITAMIN D PO) Take 1 tablet by mouth daily.    . Cyanocobalamin (VITAMIN B 12 PO) Take 1 tablet by mouth daily.    . diazepam (VALIUM) 2 MG tablet 1/2- 1 tablet as needed up to 3 times a day for anxiety. 30 tablet 0  . omeprazole (PRILOSEC) 20 MG capsule Take 1 capsule (20 mg total) by mouth daily. (Patient taking differently: Take 20 mg by mouth daily as needed. ) 30 capsule 3  . pantoprazole (PROTONIX) 40 MG tablet take 1 tablet by mouth once daily for GERD/HEART BURN (Patient taking differently: take 1 tablet by mouth once daily prn  for GERD/HEART BURN) 30 tablet 5  . nitroGLYCERIN (NITROSTAT) 0.4 MG SL tablet Place 1 tablet (0.4 mg total) under the tongue every 5 (five) minutes as needed for chest pain. 25 tablet 4   No current facility-administered medications on file prior to visit.    ROS: all negative except above.   Physical Exam: Filed Weights   11/01/14 1327  Weight: 147 lb (66.679 kg)   BP 132/72 mmHg  Pulse 72  Temp(Src) 97.7 F (36.5 C)  Resp 16  Ht '5\' 1"'  (1.549 m)  Wt 147 lb (66.679 kg)  BMI 27.79 kg/m2 General Appearance: Well nourished, in no apparent distress. Eyes: PERRLA, EOMs, conjunctiva no swelling or erythema, normal fundi and vessels. Sinuses: No Frontal/maxillary  tenderness ENT/Mouth: Ext aud canals clear, normal light reflex with TMs without erythema, bulging.  Dentures. No erythema, swelling, or exudate on post pharynx. Tonsils not swollen or erythematous. Hearing normal. + TMJ bilateral Neck: Supple, thyroid normal. No bruits Respiratory: Respiratory effort normal, BS equal bilaterally without rales, rhonchi, wheezing or stridor. Cardio: RRR without murmurs, rubs or gallops. Brisk peripheral pulses without edema.  Chest: symmetric, with normal excursions and percussion. Breasts: defer Abdomen: Soft, +BS. Epigastric tender and diffuse tenderness, no guarding, rebound, hernias, masses, or organomegaly. .  Lymphatics: Non tender without lymphadenopathy.  Genitourinary: defer Musculoskeletal: Full ROM all peripheral extremities,5/5 strength, and normal gait. Skin: Warm, dry without rashes, lesions, ecchymosis.  Neuro: Cranial nerves intact, reflexes decreased bilateral legs. Normal muscle tone, no cerebellar symptoms. Sensation decreased bilateral feet. + dix fall pike to the left Psych: Awake and oriented X 3, normal affect, Insight and Judgment appropriate.      Vicie Mutters, PA-C 2:20 PM [facility]

## 2014-11-09 ENCOUNTER — Ambulatory Visit
Admission: RE | Admit: 2014-11-09 | Discharge: 2014-11-09 | Disposition: A | Discharge status: Home / Self Care | Payer: BC Managed Care – PPO | Source: Ambulatory Visit | Attending: Physician Assistant | Admitting: Physician Assistant

## 2014-11-09 ENCOUNTER — Ambulatory Visit
Admission: RE | Admit: 2014-11-09 | Discharge: 2014-11-09 | Disposition: A | Discharge status: Home / Self Care | Payer: BC Managed Care – PPO | Source: Ambulatory Visit

## 2014-11-09 DIAGNOSIS — Z1231 Encounter for screening mammogram for malignant neoplasm of breast: Secondary | ICD-10-CM

## 2014-11-09 DIAGNOSIS — M810 Age-related osteoporosis without current pathological fracture: Secondary | ICD-10-CM

## 2014-11-20 NOTE — Progress Notes (Signed)
Patient had MGM & DEXA 11-09-14

## 2014-12-03 ENCOUNTER — Other Ambulatory Visit: Payer: Self-pay | Admitting: Physician Assistant

## 2014-12-12 ENCOUNTER — Encounter: Payer: Self-pay | Admitting: Physician Assistant

## 2014-12-12 ENCOUNTER — Ambulatory Visit (INDEPENDENT_AMBULATORY_CARE_PROVIDER_SITE_OTHER): Payer: BC Managed Care – PPO | Admitting: Physician Assistant

## 2014-12-12 VITALS — BP 102/68 | HR 100 | Temp 97.7°F | Resp 16 | Ht 61.0 in | Wt 147.0 lb

## 2014-12-12 DIAGNOSIS — J0100 Acute maxillary sinusitis, unspecified: Secondary | ICD-10-CM

## 2014-12-12 MED ORDER — AZITHROMYCIN 250 MG PO TABS: ORAL_TABLET | ORAL | Status: AC

## 2014-12-12 NOTE — Progress Notes (Signed)
Subjective:    Patient ID: Kimberly Wilcox, female    DOB: [birth year], 69 y.o.   MRN: [id number]  HPI 69 y.o. WF presents with strep pharyngitis. Her husband recently passed a month ago. She states that on Friday she started to have sore throat, fever, chills, went to UC that day and had negative flu but + strep. She was given a PCN shot in the office. She felt better Saturday but it got progressively worse again Sunday. She has had sinus pressure, headache, nonproductive cough, continuing fever. She has only been on aleve.   Blood pressure 102/68, pulse 100, temperature 97.7 F (36.5 C), resp. rate 16, height 5\' 1"  (1.549 m), weight 147 lb (66.679 kg).   Current Outpatient Prescriptions on File Prior to Visit  Medication Sig Dispense Refill  . aspirin 81 MG tablet Take 81 mg by mouth.     . Cholecalciferol (VITAMIN D PO) Take 1 tablet by mouth daily.    . Cyanocobalamin (VITAMIN B 12 PO) Take 1 tablet by mouth daily.    . diazepam (VALIUM) 2 MG tablet take 1/2 to 1 tablet by mouth UP TO three times a day if needed for anxiety 30 tablet 1  . meclizine (ANTIVERT) 12.5 MG tablet Take 1 tablet (12.5 mg total) by mouth 3 (three) times daily as needed for dizziness or nausea. 60 tablet 1  . omeprazole (PRILOSEC) 20 MG capsule Take 1 capsule (20 mg total) by mouth daily. (Patient taking differently: Take 20 mg by mouth daily as needed. ) 30 capsule 3  . pantoprazole (PROTONIX) 40 MG tablet take 1 tablet by mouth once daily for GERD/HEART BURN (Patient taking differently: take 1 tablet by mouth once daily prn for GERD/HEART BURN) 30 tablet 5  . nitroGLYCERIN (NITROSTAT) 0.4 MG SL tablet Place 1 tablet (0.4 mg total) under the tongue every 5 (five) minutes as needed for chest pain. 25 tablet 4   No current facility-administered medications on file prior to visit.   Past Medical History  Diagnosis Date  . GERD (gastroesophageal reflux disease)   . Chest discomfort   . Hiatal hernia   .  Anxiety   . Vitamin D deficiency   . Osteoporosis   . Renal cell cancer 1999    LEFT NEPHRECTOMY  . Hypertension     labile, normal cath 2013   Review of Systems  Constitutional: Positive for fever. Negative for chills and diaphoresis.  HENT: Positive for congestion, postnasal drip, sinus pressure and sneezing. Negative for ear pain, sore throat and trouble swallowing.   Respiratory: Positive for cough. Negative for chest tightness, shortness of breath and wheezing.   Cardiovascular: Negative.   Gastrointestinal: Negative.   Genitourinary: Negative.   Musculoskeletal: Negative for neck pain.  Neurological: Positive for headaches.       Objective:   Physical Exam  Constitutional: She appears well-developed and well-nourished.  HENT:  Head: Normocephalic and atraumatic.  Right Ear: External ear normal.  Nose: Right sinus exhibits maxillary sinus tenderness. Right sinus exhibits no frontal sinus tenderness. Left sinus exhibits maxillary sinus tenderness. Left sinus exhibits no frontal sinus tenderness.  Eyes: Conjunctivae and EOM are normal.  Neck: Normal range of motion. Neck supple.  Cardiovascular: Normal rate, regular rhythm, normal heart sounds and intact distal pulses.   Pulmonary/Chest: Effort normal and breath sounds normal. No respiratory distress. She has no wheezes.  Abdominal: Soft. Bowel sounds are normal.  Lymphadenopathy:    She has cervical adenopathy.  Skin: Skin  is warm and dry.       Assessment & Plan:  1. Acute maxillary sinusitis, recurrence not specified [J01.00] Possible secondary infection with atypical- add allergy pill, increase fluids, rest, and can take Zpak. Declines prednisone.  - azithromycin (ZITHROMAX) 250 MG tablet; Take 2 tablets (500 mg) on  Day 1,  followed by 1 tablet (250 mg) once daily on Days 2 through 5.  Dispense: 6 each; Refill: 1

## 2014-12-12 NOTE — Patient Instructions (Signed)

## 2014-12-28 ENCOUNTER — Other Ambulatory Visit: Payer: Self-pay

## 2014-12-28 MED ORDER — AZITHROMYCIN 250 MG PO TABS: ORAL_TABLET | ORAL | Status: DC

## 2015-01-03 ENCOUNTER — Ambulatory Visit (INDEPENDENT_AMBULATORY_CARE_PROVIDER_SITE_OTHER): Payer: BC Managed Care – PPO | Admitting: Neurology

## 2015-01-03 ENCOUNTER — Encounter: Payer: Self-pay | Admitting: Neurology

## 2015-01-03 VITALS — BP 122/74 | HR 75 | Resp 16 | Ht 61.0 in | Wt 145.0 lb

## 2015-01-03 DIAGNOSIS — G629 Polyneuropathy, unspecified: Secondary | ICD-10-CM | POA: Diagnosis not present

## 2015-01-03 DIAGNOSIS — R413 Other amnesia: Secondary | ICD-10-CM

## 2015-01-03 NOTE — Patient Instructions (Signed)
1. Schedule EMG of right UE and LE with Dr. Posey Pronto 2. Continue to monitor symptoms, we will consider medication on your next visit 3. Follow-up in 3-4 months

## 2015-01-03 NOTE — Progress Notes (Signed)
NEUROLOGY CONSULTATION NOTE  Kimberly Wilcox MRN: [id number] DOB: 20-Mar-1946   Referring provider: Dr. Unk Pinto Primary care provider: Dr. Unk Pinto  Reason for consult:  Dizziness, memory loss, tingling in legs and feet  Dear Dr Melford Aase:  Thank you for your kind referral of Kimberly Wilcox for consultation of the above symptoms. Although her history is well known to you, please allow me to reiterate it for the purpose of our medical record. Records and images were personally reviewed where available.  HISTORY OF PRESENT ILLNESS: This is a 69 year old right-handed woman with a history of hypertension, anxiety, presenting for several neurological symptoms. Her main concern is the tingling and pins and needles in both legs and feet that started a year ago. She has intermittent sharp pain in her big toe. She was having similar sensations in both arms and reports that after she was diagnosed with Lyme disease and treated with antibiotics, the symptoms initially abated. She recalls an episode in February where she had hot stinging burning pain on the tops of both hands and feet that lasted for 5 mintues. 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 She had shooting pain behind her left buttock last Christmas, which resolved after 30 minutes when she gets up and starts moving. She reports an episode last 10/25/14 when she started having numbness on her left cheek, slowly moving down her left arm. She felt sore on her left thigh.She went to the ER where CBC and BMP were normal. She had an MRI brain with and without contrast which I personally reviewed, which is normal, no abnormal enhancement seen. She saw her PCP and due to multiple complaints had extensive bloodwork done with normal CK, RPR, TSH, B12, vitamin D, iron panel, negative HIV, RMSF, Lyme Ab.  HbA1c of 6.0. She reports increased headaches in the past year, with frontal pressure that  is near-constant. There is some photosensitivity, no nausea/vomiting. She denies any dizziness but feels her balance is off. No falls. She has infrequent episodes around 5 times a year where she has a stabbing pressure in the left eye lasting 20 minutes. She reports that laser surgery is planned for her left eye for the pressure. She has some difficulty swallowing, neck pain, no back pain, no bowel/bladder dysfunction. She reports her memory has never been good, but "maybe stepped up a little bit." She mostly reports short-term memory changes and word-finding difficulties. She occasionally forgets her medications and had missed paying bills when her husband passed away 2 months ago. She denies getting lost driving. No history of head injuries or alcohol use. No family history of dementia.   PAST MEDICAL HISTORY: Past Medical History  Diagnosis Date  . GERD (gastroesophageal reflux disease)   . Chest discomfort   . Hiatal hernia   . Anxiety   . Vitamin D deficiency   . Osteoporosis   . Renal cell cancer 1999    LEFT NEPHRECTOMY  . Hypertension     labile, normal cath 2013    PAST SURGICAL HISTORY: Past Surgical History  Procedure Laterality Date  . Nephrectomy Left 1999    secondary to cancer  . Left oophorectomy  1985    Benign tumor  . Tubal ligation      MEDICATIONS: Current Outpatient Prescriptions on File Prior to Visit  Medication Sig Dispense Refill  . aspirin 81 MG tablet Take 81 mg by mouth.     Marland Kitchen  Cholecalciferol (VITAMIN D PO) Take 1 tablet by mouth daily.    . Cyanocobalamin (VITAMIN B 12 PO) Take 1 tablet by mouth daily.    . diazepam (VALIUM) 2 MG tablet take 1/2 to 1 tablet by mouth UP TO three times a day if needed for anxiety 30 tablet 1  . omeprazole (PRILOSEC) 20 MG capsule Take 1 capsule (20 mg total) by mouth daily. (Patient taking differently: Take 20 mg by mouth daily as needed. ) 30 capsule 3  . pantoprazole (PROTONIX) 40 MG tablet take 1 tablet by mouth once  daily for GERD/HEART BURN (Patient taking differently: take 1 tablet by mouth once daily prn for GERD/HEART BURN) 30 tablet 5  . nitroGLYCERIN (NITROSTAT) 0.4 MG SL tablet Place 1 tablet (0.4 mg total) under the tongue every 5 (five) minutes as needed for chest pain. (Patient not taking: Reported on 01/03/2015) 25 tablet 4   No current facility-administered medications on file prior to visit.    ALLERGIES: Allergies  Allergen Reactions  . Nitrofurantoin Monohyd Macro     "flu like" symptoms   . Sulfa Antibiotics Hives    FAMILY HISTORY: Family History  Problem Relation Age of Onset  . Pneumonia Father     SOCIAL HISTORY: History   Social History  . Marital Status: Widowed    Spouse Name: N/A  . Number of Children: 4  . Years of Education: N/A   Occupational History  . [employer] consultant    Social History Main Topics  . Smoking status: Former Smoker    Quit date: 10/28/1984  . Smokeless tobacco: Never Used  . Alcohol Use: 0.0 oz/week    0 Standard drinks or equivalent per week     Comment: wine 2-3 glasses once a month  . Drug Use: No  . Sexual Activity: Not on file   Other Topics Concern  . Not on file   Social History Narrative    REVIEW OF SYSTEMS: Constitutional: No fevers, chills, or sweats, no generalized fatigue, change in appetite Eyes: No visual changes, double vision, eye pain Ear, nose and throat: No hearing loss, ear pain, nasal congestion, sore throat Cardiovascular: No chest pain, palpitations Respiratory:  No shortness of breath at rest or with exertion, wheezes GastrointestinaI: No nausea, vomiting, diarrhea, abdominal pain, fecal incontinence Genitourinary:  No dysuria, urinary retention or frequency Musculoskeletal:  No neck pain, back pain Integumentary: No rash, pruritus, skin lesions Neurological: as above Psychiatric: No depression, insomnia,+ anxiety Endocrine: No palpitations, fatigue, diaphoresis, mood swings, change in appetite, change  in weight, increased thirst Hematologic/Lymphatic:  No anemia, purpura, petechiae. Allergic/Immunologic: no itchy/runny eyes, nasal congestion, recent allergic reactions, rashes  PHYSICAL EXAM: Filed Vitals:   01/03/15 1056  BP: 122/74  Pulse: 75  Resp: 16   General: No acute distress Head:  Normocephalic/atraumatic Eyes: Fundoscopic exam shows bilateral sharp discs, no vessel changes, exudates, or hemorrhages Neck: supple, no paraspinal tenderness, full range of motion Back: No paraspinal tenderness Heart: regular rate and rhythm Lungs: Clear to auscultation bilaterally. Vascular: No carotid bruits. Skin/Extremities: No rash, no edema Neurological Exam: Mental status: alert and oriented to person, place, and time, no dysarthria or aphasia, Fund of knowledge is appropriate.  Recent and remote memory are intact.  Attention and concentration are normal.    Able to name objects and repeat phrases.  MMSE - Mini Mental State Exam 01/03/2015  Orientation to time 4  Orientation to Place 5  Registration 3  Attention/ Calculation 5  Recall  2  Language- name 2 objects 2  Language- repeat 1  Language- follow 3 step command 3  Language- read & follow direction 1  Write a sentence 1  Copy design 1  Total score 28   Cranial nerves: CN I: not tested CN II: pupils equal, round and reactive to light, visual fields intact, fundi unremarkable. CN III, IV, VI:  full range of motion, no nystagmus, no ptosis CN V: facial sensation intact CN VII: upper and lower face symmetric CN VIII: hearing intact to finger rub CN IX, X: gag intact, uvula midline CN XI: sternocleidomastoid and trapezius muscles intact CN XII: tongue midline Bulk & Tone: normal, no fasciculations. Motor: 5/5 throughout with no pronator drift. Sensation: decreased pin, cold in stocking distribution bilaterally up to above the ankles. Decreased vibration sense up to both knees. Intact joint position sense. Reports decreased  pin in both hands. Romberg test slight sway Deep Tendon Reflexes: +2 throughout except for absent ankle jerks bilaterally, no ankle clonus Plantar responses: downgoing bilaterally Cerebellar: no incoordination on finger to nose, heel to shin. No dysdiadochokinesia Gait: narrow-based and steady, able to tandem walk adequately. Tremor: none  IMPRESSION: This is a 69 year old right-handed woman with a history of hypertension, anxiety, presenting for several symptoms including paresthesias in both legs and memory loss. She was referred for dizziness but denies any dizziness. She had an episode of left-sided numbness last March, MRI brain with and without contrast normal. TSH, B12 normal. Her MMSE is normal 28/30. Exam shows length-dependent neuropathy, EMG/NCV of the right UE and LE will be scheduled. We discussed the option for symptomatic treatment with membrane stabilizing agents such as gabapentin, she would like to hold off until after the EMG. She will follow-up in 3-4 months.   Thank you for allowing me to participate in the care of this patient. Please do not hesitate to call for any questions or concerns.   Ellouise Newer, M.D.  CC: Dr. Melford Aase

## 2015-01-09 DIAGNOSIS — R413 Other amnesia: Secondary | ICD-10-CM | POA: Insufficient documentation

## 2015-01-09 DIAGNOSIS — G629 Polyneuropathy, unspecified: Secondary | ICD-10-CM | POA: Insufficient documentation

## 2015-01-15 ENCOUNTER — Encounter: Payer: Self-pay | Admitting: Internal Medicine

## 2015-01-15 ENCOUNTER — Ambulatory Visit (INDEPENDENT_AMBULATORY_CARE_PROVIDER_SITE_OTHER): Payer: BC Managed Care – PPO | Admitting: Internal Medicine

## 2015-01-15 VITALS — BP 118/66 | HR 70 | Temp 98.4°F | Resp 16 | Ht 61.0 in | Wt 145.0 lb

## 2015-01-15 DIAGNOSIS — J029 Acute pharyngitis, unspecified: Secondary | ICD-10-CM

## 2015-01-15 DIAGNOSIS — F32A Depression, unspecified: Secondary | ICD-10-CM

## 2015-01-15 DIAGNOSIS — J309 Allergic rhinitis, unspecified: Secondary | ICD-10-CM

## 2015-01-15 DIAGNOSIS — F329 Major depressive disorder, single episode, unspecified: Secondary | ICD-10-CM

## 2015-01-15 MED ORDER — PREDNISONE 20 MG PO TABS: ORAL_TABLET | ORAL | Status: DC

## 2015-01-15 MED ORDER — MOMETASONE FUROATE 50 MCG/ACT NA SUSP: 2.0000 | Freq: Every day | NASAL | Status: DC

## 2015-01-15 NOTE — Progress Notes (Signed)
Subjective:    Patient ID: Kimberly Wilcox, female    DOB: November 23, 1945, 69 y.o.   MRN: [id number]  Sore Throat  Associated symptoms include abdominal pain, congestion and coughing. Pertinent negatives include no shortness of breath, trouble swallowing or vomiting.  Patient presents to the office for evaluation of pain underneath her jaw and soreness in her glands.  She reports that this has been going on for 4-6 weeks and had gotten better and is now bothering her again.  She reports that she has finished 2 zpaks and is also using a neti pot which has been helping a little bit.  She also reports that she did have some mild chest pain which was cleared after her last zpak.  She reports that her acid reflux has been good and has been under control.    She has had some allergy problems in the past and has been taking zyrtec at night.  She is only taking a 1/2 tablet.    She has been very stressed out and has had a lot to deal with since the death of her husband.      Review of Systems  Constitutional: Positive for appetite change. Negative for fever, chills and fatigue.  HENT: Positive for congestion, postnasal drip, rhinorrhea, sinus pressure and sore throat. Negative for trouble swallowing.   Respiratory: Positive for cough. Negative for chest tightness and shortness of breath.   Cardiovascular: Negative for chest pain and palpitations.  Gastrointestinal: Positive for abdominal pain. Negative for nausea and vomiting.  Psychiatric/Behavioral: Positive for sleep disturbance, dysphoric mood and decreased concentration.       Objective:   Physical Exam  Constitutional: She is oriented to person, place, and time. She appears well-developed and well-nourished. No distress.  HENT:  Head: Normocephalic.  Right Ear: A middle ear effusion is present.  Left Ear: A middle ear effusion is present.  Nose: Mucosal edema present.  Mouth/Throat: Uvula is midline, oropharynx is clear and moist and  mucous membranes are normal. No trismus in the jaw. No oropharyngeal exudate, posterior oropharyngeal edema or posterior oropharyngeal erythema.  Eyes: Conjunctivae are normal. No scleral icterus.  Neck: Normal range of motion. Neck supple. No JVD present. No thyromegaly present.  Cardiovascular: Normal rate, regular rhythm, normal heart sounds and intact distal pulses.  Exam reveals no gallop and no friction rub.   No murmur heard. Pulmonary/Chest: Effort normal and breath sounds normal. No respiratory distress. She has no wheezes. She has no rales. She exhibits no tenderness.  Abdominal: Soft. Bowel sounds are normal. She exhibits no distension and no mass. There is no tenderness. There is no rebound and no guarding.  Musculoskeletal: Normal range of motion.  Lymphadenopathy:    She has no cervical adenopathy.  Neurological: She is alert and oriented to person, place, and time.  Skin: Skin is warm and dry. She is not diaphoretic.  Psychiatric: Her speech is normal and behavior is normal. Judgment and thought content normal. Cognition and memory are normal. She exhibits a depressed mood.  Nursing note and vitals reviewed.         Assessment & Plan:    1. Acute pharyngitis, unspecified pharyngitis type -likely due to post nasal drip from allergic rhinitis.  If no improvement consider GERD.  May need referral to ENT vs. GI. -nasal saline -zyrtec - mometasone (NASONEX) 50 MCG/ACT nasal spray; Place 2 sprays into the nose daily.  Dispense: 17 g; Refill: 2 - predniSONE (DELTASONE) 20 MG tablet; 3  tabs po day one, then 2 tabs daily x 4 days  Dispense: 11 tablet; Refill: 0  2. Allergic rhinitis, unspecified allergic rhinitis type  - mometasone (NASONEX) 50 MCG/ACT nasal spray; Place 2 sprays into the nose daily.  Dispense: 17 g; Refill: 2 - predniSONE (DELTASONE) 20 MG tablet; 3 tabs po day one, then 2 tabs daily x 4 days  Dispense: 11 tablet; Refill: 0   Depression due to death of  husband.  Patient and I discussed possible antidepressant which she declined.  She will let us know if it gets worse.

## 2015-01-15 NOTE — Patient Instructions (Signed)
Melatonin oral solution What is this medicine? Melatonin (mel uh ton nin) is a supplement. It is promoted to help with sleep. The FDA has not approved this supplement for any medical use. This supplement may be used for other purposes; ask your health care provider or pharmacist if you have questions. This medicine may be used for other purposes; ask your health care provider or pharmacist if you have questions. What should I tell my health care provider before I take this medicine? They need to know if you have any of these conditions: -brain disorder -cancer -depression -endocrine disease -immune system problems -infection -kidney disease -liver disease -nerve disorder -seizures -spinal cord disorder -take medicine called corticosteroids -an unusual or allergic reaction to melatonin, cow protein, other supplements, plants, medicines, foods, dyes, or preservatives -pregnant or trying to get pregnant -breast-feeding How should I use this medicine? Take this supplement by mouth. Follow the directions on the package labeling, or talk to your health care professional for advice. Use a specially marked spoon or container to measure each dose. Ask your pharmacist if you do not have one. Household spoons are not accurate. Do not take this supplement more often than directed. Contact your pediatrician or health care professional regarding the use of this supplement in children. Special care may be needed. Overdosage: If you think you've taken too much of this medicine contact a poison control center or emergency room at once. Overdosage: If you think you have taken too much of this medicine contact a poison control center or emergency room at once. NOTE: This medicine is only for you. Do not share this medicine with others. What if I miss a dose? If you miss a dose, take it as soon as you can. If it is almost time for your next dose, take only that dose. Do not take double or extra doses. What  may interact with this medicine? Check with your doctor or healthcare professional if you are taking any of the following medications: -hormone medicines -medicines for blood pressure like nifedipine -medications for anxiety, depression, or other emotional or psychiatric problems -medications for seizures -medications for sleep -other herbal or dietary supplements -tamoxifen -treatments for cancer or immune disorders This list may not describe all possible interactions. Give your health care provider a list of all the medicines, herbs, non-prescription drugs, or dietary supplements you use. Also tell them if you smoke, drink alcohol, or use illegal drugs. Some items may interact with your medicine. What should I watch for while using this medicine? If you are already being treated for insomnia use this supplement only by your doctor's direction. This supplement may interfere with other treatments. See your doctor if your symptoms do not get better or if they get worse. You may get drowsy or dizzy. Do not drive, use machinery, or do anything that needs mental alertness until you know how this medicine affects you. Do not stand or sit up quickly, especially if you are an older patient. This reduces the risk of dizzy or fainting spells. Alcohol may interfere with the effect of this medicine. Avoid alcoholic drinks. Herbal or dietary supplements are not regulated like medicines. Rigid quality control standards are not required for dietary supplements. The purity and strength of these products can vary. The safety and effect of this dietary supplement for a certain disease or illness is not well known. This product is not intended to diagnose, treat, cure or prevent any disease. The Food and Drug Administration suggests the following to  help consumers protect themselves: -Always read product labels and follow directions. -Natural does not mean a product is safe for humans to take. -Look for products  that include USP after the ingredient name. This means that the manufacturer followed the standards of the [place] Pharmacopoeia. -Supplements made or sold by a nationally known food or drug company are more likely to be made under tight controls. You can write to the company for more information about how the product was made. What side effects may I notice from receiving this medicine? Side effects that you should report to your doctor or health care professional as soon as possible: -allergic reactions like skin rash, itching or hives, swelling of the face, lips, or tongue -confusion -dark urine -depressed mood -general ill feeling or flu-like symptoms -fast, irregular heartbeat -hallucination, loss of contact with reality -light-colored stools -loss of appetite, nausea -right upper belly pain -unusually weak or tired -yellowing of the eyes or skin Side effects that usually do not require medical attention (Report these to your doctor or health care professional if they continue or are bothersome.): -headache -nightmares -stomach pain -stomach upset This list may not describe all possible side effects. Call your doctor for medical advice about side effects. You may report side effects to FDA at 1-800-FDA-1088. Where should I keep my medicine? Keep out of the reach of children. Store as directed on the package label. Throw away any unused supplement after the expiration date. NOTE: This sheet is a summary. It may not cover all possible information. If you have questions about this medicine, talk to your doctor, pharmacist, or health care provider.  2015, Elsevier/Gold Standard. (2013-06-23 18:37:16)  Allergic Rhinitis Allergic rhinitis is when the mucous membranes in the nose respond to allergens. Allergens are particles in the air that cause your body to have an allergic reaction. This causes you to release allergic antibodies. Through a chain of events, these eventually cause you to  release histamine into the blood stream. Although meant to protect the body, it is this release of histamine that causes your discomfort, such as frequent sneezing, congestion, and an itchy, runny nose.  CAUSES  Seasonal allergic rhinitis (hay fever) is caused by pollen allergens that may come from grasses, trees, and weeds. Year-round allergic rhinitis (perennial allergic rhinitis) is caused by allergens such as house dust mites, pet dander, and mold spores.  SYMPTOMS   Nasal stuffiness (congestion).  Itchy, runny nose with sneezing and tearing of the eyes. DIAGNOSIS  Your health care provider can help you determine the allergen or allergens that trigger your symptoms. If you and your health care provider are unable to determine the allergen, skin or blood testing may be used. TREATMENT  Allergic rhinitis does not have a cure, but it can be controlled by:  Medicines and allergy shots (immunotherapy).  Avoiding the allergen. Hay fever may often be treated with antihistamines in pill or nasal spray forms. Antihistamines block the effects of histamine. There are over-the-counter medicines that may help with nasal congestion and swelling around the eyes. Check with your health care provider before taking or giving this medicine.  If avoiding the allergen or the medicine prescribed do not work, there are many new medicines your health care provider can prescribe. Stronger medicine may be used if initial measures are ineffective. Desensitizing injections can be used if medicine and avoidance does not work. Desensitization is when a patient is given ongoing shots until the body becomes less sensitive to the allergen. Make sure  you follow up with your health care provider if problems continue. HOME CARE INSTRUCTIONS It is not possible to completely avoid allergens, but you can reduce your symptoms by taking steps to limit your exposure to them. It helps to know exactly what you are allergic to so that  you can avoid your specific triggers. SEEK MEDICAL CARE IF:   You have a fever.  You develop a cough that does not stop easily (persistent).  You have shortness of breath.  You start wheezing.  Symptoms interfere with normal daily activities. Document Released: 05/05/2001 Document Revised: 08/15/2013 Document Reviewed: 04/17/2013 [facility] Patient Information 2015 [place], [place]. This information is not intended to replace advice given to you by your health care provider. Make sure you discuss any questions you have with your health care provider.

## 2015-01-22 ENCOUNTER — Ambulatory Visit (INDEPENDENT_AMBULATORY_CARE_PROVIDER_SITE_OTHER): Payer: BC Managed Care – PPO | Admitting: Neurology

## 2015-01-22 DIAGNOSIS — G5601 Carpal tunnel syndrome, right upper limb: Secondary | ICD-10-CM

## 2015-01-22 DIAGNOSIS — G629 Polyneuropathy, unspecified: Secondary | ICD-10-CM

## 2015-01-22 NOTE — Procedures (Signed)
[facility]  [place] [place] [facility] [place]  [place], [place] [address] Tel: [phone] Fax:  [phone] Test Date:  01/22/2015  Patient: Kimberly Wilcox DOB: [birth year] Physician: Narda Amber, DO  Sex: Female Height: 5\' 1"  Ref Phys: Ellouise Newer  ID#: [id number] Temp: 32.0C Technician: Jerilynn Mages. Dean   Patient Complaints: This is a 69 year old female presenting for evaluation of generalized weakness and paresthesias involving both hands and feet.   NCV & EMG Findings: Extensive electrodiagnostic testing of the right upper extremity, right lower extremity and additional studies of the left upper extremity shows:  1. Right palmar sensory response shows prolonged distal latency. Bilateral median, ulnar, radial, and the left palmar sensory response is within normal limits. 2. Bilateral median and ulnar motor responses are within normal limits. 3. Right sural and superficial peroneal sensory responses are within normal limits. 4. Right peroneal and tibial motor responses are within normal limits. 5. There is no evidence of active or chronic motor axon loss changes affecting any of the tested muscles. Motor unit configuration and recruitment pattern is within normal limits.   Impression: 1. Right median neuropathy at or distal to the wrist, consistent with the clinical diagnosis of carpal tunnel syndrome. Overall, these findings are mild in degree electrically. 2. There is no evidence of a generalized sensorimotor polyneuropathy, diffuse myopathy, cervical radiculopathy, or lumbosacral radiculopathy. A small fiber neuropathy cannot be excluded by this study.   ___________________________ Narda Amber, DO    Nerve Conduction Studies Anti Sensory Summary Table   Site NR Peak (ms) Norm Peak (ms) P-T Amp (V) Norm P-T Amp  Left Median Anti Sensory (2nd Digit)  Wrist    3.5 <3.8 31.3 >10  Right Median Anti Sensory (2nd Digit)  Wrist    3.3 <3.8 20.7 >10  Left Radial Anti Sensory  (Base 1st Digit)  Wrist    1.8 <2.8 25.4 >10  Right Radial Anti Sensory (Base 1st Digit)  Wrist    1.7 <2.8 40.6 >10  Right Sup Peroneal Anti Sensory (Ant Lat Mall)  12 cm    3.0 <4.6 10.0 >3  Right Sural Anti Sensory (Lat Mall)  Calf    3.1 <4.6 9.5 >3  Left Ulnar Anti Sensory (5th Digit)  Wrist    3.0 <3.2 36.7 >5  Right Ulnar Anti Sensory (5th Digit)  Wrist    3.2 <3.2 32.0 >5   Motor Summary Table   Site NR Onset (ms) Norm Onset (ms) O-P Amp (mV) Norm O-P Amp Site1 Site2 Delta-0 (ms) Dist (cm) Vel (m/s) Norm Vel (m/s)  Left Median Motor (Abd Poll Brev)  Wrist    3.5 <4.0 6.4 >5 Elbow Wrist 3.7 23.0 62 >50  Elbow    7.2  6.1         Right Median Motor (Abd Poll Brev)  Wrist    3.5 <4.0 5.8 >5 Elbow Wrist 3.6 23.0 64 >50  Elbow    7.1  5.8         Right Peroneal Motor (Ext Dig Brev)  Ankle    3.0 <6.0 3.3 >2.5 B Fib Ankle 5.8 29.0 50 >40  B Fib    8.8  2.8  Poplt B Fib 1.7 10.0 59 >40  Poplt    10.5  2.8         Right Tibial Motor (Abd Hall Brev)  Ankle    3.0 <6.0 15.0 >4 Knee Ankle 7.5 37.0 49 >40  Knee    10.5  9.6  Left Ulnar Motor (Abd Dig Minimi)  Wrist    2.4 <3.1 7.4 >7 B Elbow Wrist 3.2 17.0 53 >50  B Elbow    5.6  7.4  A Elbow B Elbow 1.5 10.0 67 >50  A Elbow    7.1  7.4         Right Ulnar Motor (Abd Dig Minimi)  Wrist    2.4 <3.1 9.7 >7 B Elbow Wrist 3.3 18.0 55 >50  B Elbow    5.7  8.7  A Elbow B Elbow 1.6 10.0 63 >50  A Elbow    7.3  8.5          Comparison Summary Table   Site NR Peak (ms) Norm Peak (ms) P-T Amp (V) Site1 Site2 Delta-P (ms) Norm Delta (ms)  Left Median/Ulnar Palm Comparison (Wrist - 8cm)  Median Palm    2.1 <2.2 51.7 Median Palm Ulnar Palm 0.3   Ulnar Palm    1.8 <2.2 30.0      Right Median/Ulnar Palm Comparison (Wrist - 8cm)  Median Palm    2.3 <2.2 74.2 Median Palm Ulnar Palm 0.7   Ulnar Palm    1.6 <2.2 17.3       H Reflex Studies   NR H-Lat (ms) Lat Norm (ms) L-R H-Lat (ms)  Right Tibial (Gastroc)     31.84 <35     EMG   Side Muscle Ins Act Fibs Psw Fasc Number Recrt Dur Dur. Amp Amp. Poly Poly. Comment  Left 1stDorInt Nml Nml Nml Nml Nml Nml Nml Nml Nml Nml Nml Nml N/A  Left Ext Indicis Nml Nml Nml Nml Nml Nml Nml Nml Nml Nml Nml Nml N/A  Left PronatorTeres Nml Nml Nml Nml Nml Nml Nml Nml Nml Nml Nml Nml N/A  Left Biceps Nml Nml Nml Nml Nml Nml Nml Nml Nml Nml Nml Nml N/A  Left Triceps Nml Nml Nml Nml Nml Nml Nml Nml Nml Nml Nml Nml N/A  Left Deltoid Nml Nml Nml Nml Nml Nml Nml Nml Nml Nml Nml Nml N/A  Right AntTibialis Nml Nml Nml Nml Nml Nml Nml Nml Nml Nml Nml Nml N/A  Right Gastroc Nml Nml Nml Nml Nml Nml Nml Nml Nml Nml Nml Nml N/A  Right Flex Dig Long Nml Nml Nml Nml Nml Nml Nml Nml Nml Nml Nml Nml N/A  Right RectFemoris Nml Nml Nml Nml Nml Nml Nml Nml Nml Nml Nml Nml N/A  Right GluteusMed Nml Nml Nml Nml Nml Nml Nml Nml Nml Nml Nml Nml N/A  Right 1stDorInt Nml Nml Nml Nml Nml Nml Nml Nml Nml Nml Nml Nml N/A  Right Abd Poll Brev Nml Nml Nml Nml Nml Nml Nml Nml Nml Nml Nml Nml N/A  Right PronatorTeres Nml Nml Nml Nml Nml Nml Nml Nml Nml Nml Nml Nml N/A  Right Biceps Nml Nml Nml Nml Nml Nml Nml Nml Nml Nml Nml Nml N/A  Right Triceps Nml Nml Nml Nml Nml Nml Nml Nml Nml Nml Nml Nml N/A  Right Cervical Parasp Low Nml Nml Nml Nml Nml Nml Nml Nml Nml Nml Nml Nml N/A      Waveforms:

## 2015-01-29 ENCOUNTER — Telehealth: Payer: Self-pay | Admitting: Family Medicine

## 2015-01-29 NOTE — Telephone Encounter (Signed)
-----   Message from Cameron Sprang, MD sent at 01/28/2015 12:25 PM EDT ----- Pls let her know the nerve test was overall normal except for mild carpal tunnel syndrome on the right. Recommend using a wrist splint on the right wrist. Does she want to start the medication gabapentin for symptoms of tingling and pins/needles in her legs? Thanks

## 2015-01-29 NOTE — Telephone Encounter (Signed)
Patient notified of results & advisement. She doesn't want to try any medications at this time.

## 2015-05-07 ENCOUNTER — Ambulatory Visit: Payer: BC Managed Care – PPO | Admitting: Neurology

## 2015-05-09 ENCOUNTER — Encounter: Payer: Self-pay | Admitting: *Deleted

## 2015-05-21 ENCOUNTER — Other Ambulatory Visit: Payer: Self-pay | Admitting: Internal Medicine

## 2015-06-21 ENCOUNTER — Encounter (INDEPENDENT_AMBULATORY_CARE_PROVIDER_SITE_OTHER): Payer: Self-pay

## 2015-08-13 ENCOUNTER — Encounter: Payer: Self-pay | Admitting: Internal Medicine

## 2015-08-13 ENCOUNTER — Ambulatory Visit (INDEPENDENT_AMBULATORY_CARE_PROVIDER_SITE_OTHER): Payer: BC Managed Care – PPO | Admitting: Internal Medicine

## 2015-08-13 VITALS — BP 144/68 | HR 70 | Temp 98.2°F | Resp 16 | Ht 61.0 in | Wt 148.0 lb

## 2015-08-13 DIAGNOSIS — R300 Dysuria: Secondary | ICD-10-CM

## 2015-08-13 MED ORDER — CEFTRIAXONE SODIUM 1 G IJ SOLR
0.5000 g | Freq: Once | INTRAMUSCULAR | Status: AC
  Administered 2015-08-13: 0.5 g via INTRAMUSCULAR

## 2015-08-13 MED ORDER — ONDANSETRON 8 MG PO TBDP: ORAL_TABLET | ORAL | Status: DC

## 2015-08-13 MED ORDER — CIPROFLOXACIN HCL 500 MG PO TABS: 500.0000 mg | ORAL_TABLET | Freq: Two times a day (BID) | ORAL | Status: AC

## 2015-08-13 NOTE — Progress Notes (Signed)
   Subjective:    Patient ID: Kimberly Wilcox, female    DOB: [birth year], 69 y.o.   MRN: [id number]  Dysuria  Associated symptoms include chills, flank pain, frequency, nausea and urgency. Pertinent negatives include no hematuria or vomiting.  Abdominal Pain Associated symptoms include dysuria, frequency and nausea. Pertinent negatives include no constipation, diarrhea, fever, hematuria or vomiting.  Flank Pain Associated symptoms include abdominal pain and dysuria. Pertinent negatives include no fever.   Patient reports that for the past 2 weeks she has been very tired, feeling forgetful, and having to urinate frequently for the past 2 weeks.  She reports that she has been very gassy and has been stooling a lot. She reports that she has had some more stomach cramping which has resolved in the past 3 days.  She is now having constant aching that radiates to her lower back. She has not been taking any OTC meds.  She has taken some of her husbands medicine that she thinks is a narcotic.  She reports that it does help a little bit.     Review of Systems  Constitutional: Positive for chills. Negative for fever.  Gastrointestinal: Positive for nausea and abdominal pain. Negative for vomiting, diarrhea and constipation.  Genitourinary: Positive for dysuria, urgency, frequency and flank pain. Negative for hematuria, vaginal discharge and vaginal pain.       Objective:   Physical Exam  Constitutional: She is oriented to person, place, and time. She appears well-developed and well-nourished. No distress.  HENT:  Head: Normocephalic.  Mouth/Throat: Oropharynx is clear and moist.  Eyes: Conjunctivae are normal. No scleral icterus.  Neck: Normal range of motion. Neck supple. No JVD present. No thyromegaly present.  Cardiovascular: Normal rate, regular rhythm and intact distal pulses.  Exam reveals no gallop and no friction rub.   No murmur heard. Pulmonary/Chest: Effort normal and breath sounds  normal. No respiratory distress. She has no wheezes. She has no rales. She exhibits no tenderness.  Abdominal: Soft. Normal appearance and bowel sounds are normal. She exhibits no distension and no mass. There is no tenderness. There is no rebound, no guarding and no CVA tenderness.  Very mild CVA bilaterally.  Musculoskeletal: Normal range of motion.  Lymphadenopathy:    She has no cervical adenopathy.  Neurological: She is alert and oriented to person, place, and time.  Skin: Skin is warm and dry. She is not diaphoretic.  Psychiatric: She has a normal mood and affect. Her behavior is normal. Judgment and thought content normal.  Nursing note and vitals reviewed.    Filed Vitals:   08/13/15 1413  BP: 144/68  Pulse: 70  Temp: 98.2 F (36.8 C)  Resp: 16           Assessment & Plan:    1. Dysuria -given nausea and also some mild CVA tenderness will give ceftriaxone -patient to go to ER for intractable nausea, vomiting, or flank pain - ondansetron (ZOFRAN ODT) 8 MG disintegrating tablet; 8mg  ODT q4 hours prn nausea  Dispense: 20 tablet; Refill: 0 - ciprofloxacin (CIPRO) 500 MG tablet; Take 1 tablet (500 mg total) by mouth 2 (two) times daily.  Dispense: 14 tablet; Refill: 0 - Culture, Urine - Urinalysis, Routine w reflex microscopic (not at [facility])

## 2015-08-13 NOTE — Addendum Note (Signed)
Addended by: Breslyn Abdo A on: 08/13/2015 02:52 PM   Modules accepted: Orders

## 2015-08-14 LAB — URINALYSIS, ROUTINE W REFLEX MICROSCOPIC
Bilirubin Urine: NEGATIVE
Glucose, UA: NEGATIVE
Hgb urine dipstick: NEGATIVE
Ketones, ur: NEGATIVE
Leukocytes, UA: NEGATIVE
Nitrite: NEGATIVE
Protein, ur: NEGATIVE
Specific Gravity, Urine: 1.006 (ref 1.001–1.035)
pH: 6.5 (ref 5.0–8.0)

## 2015-08-14 LAB — URINE CULTURE
Colony Count: NO GROWTH
Organism ID, Bacteria: NO GROWTH

## 2015-09-16 ENCOUNTER — Other Ambulatory Visit: Payer: Self-pay

## 2015-09-17 ENCOUNTER — Encounter: Payer: Self-pay | Admitting: Internal Medicine

## 2015-09-17 ENCOUNTER — Ambulatory Visit (INDEPENDENT_AMBULATORY_CARE_PROVIDER_SITE_OTHER): Payer: BC Managed Care – PPO | Admitting: Internal Medicine

## 2015-09-17 VITALS — BP 128/76 | HR 74 | Temp 98.2°F | Resp 16 | Ht 61.0 in | Wt 149.0 lb

## 2015-09-17 DIAGNOSIS — N3000 Acute cystitis without hematuria: Secondary | ICD-10-CM

## 2015-09-17 DIAGNOSIS — R0989 Other specified symptoms and signs involving the circulatory and respiratory systems: Secondary | ICD-10-CM

## 2015-09-17 DIAGNOSIS — R7303 Prediabetes: Secondary | ICD-10-CM

## 2015-09-17 DIAGNOSIS — Z79899 Other long term (current) drug therapy: Secondary | ICD-10-CM | POA: Diagnosis not present

## 2015-09-17 DIAGNOSIS — Z23 Encounter for immunization: Secondary | ICD-10-CM

## 2015-09-17 DIAGNOSIS — M940 Chondrocostal junction syndrome [Tietze]: Secondary | ICD-10-CM | POA: Diagnosis not present

## 2015-09-17 DIAGNOSIS — E559 Vitamin D deficiency, unspecified: Secondary | ICD-10-CM | POA: Diagnosis not present

## 2015-09-17 DIAGNOSIS — E785 Hyperlipidemia, unspecified: Secondary | ICD-10-CM

## 2015-09-17 DIAGNOSIS — I10 Essential (primary) hypertension: Secondary | ICD-10-CM

## 2015-09-17 LAB — CBC WITH DIFFERENTIAL/PLATELET
Basophils Absolute: 0.1 10*3/uL (ref 0.0–0.1)
Basophils Relative: 1 % (ref 0–1)
Eosinophils Absolute: 0.1 10*3/uL (ref 0.0–0.7)
Eosinophils Relative: 2 % (ref 0–5)
HCT: 40.7 % (ref 36.0–46.0)
Hemoglobin: 12.9 g/dL (ref 12.0–15.0)
Lymphocytes Relative: 44 % (ref 12–46)
Lymphs Abs: 2.6 10*3/uL (ref 0.7–4.0)
MCH: 26.4 pg (ref 26.0–34.0)
MCHC: 31.7 g/dL (ref 30.0–36.0)
MCV: 83.4 fL (ref 78.0–100.0)
MPV: 9.8 fL (ref 8.6–12.4)
Monocytes Absolute: 0.3 10*3/uL (ref 0.1–1.0)
Monocytes Relative: 5 % (ref 3–12)
Neutro Abs: 2.8 10*3/uL (ref 1.7–7.7)
Neutrophils Relative %: 48 % (ref 43–77)
Platelets: 254 10*3/uL (ref 150–400)
RBC: 4.88 MIL/uL (ref 3.87–5.11)
RDW: 14 % (ref 11.5–15.5)
WBC: 5.9 10*3/uL (ref 4.0–10.5)

## 2015-09-17 LAB — HEPATIC FUNCTION PANEL
ALT: 7 U/L (ref 6–29)
AST: 21 U/L (ref 10–35)
Albumin: 4.3 g/dL (ref 3.6–5.1)
Alkaline Phosphatase: 60 U/L (ref 33–130)
Bilirubin, Direct: 0.1 mg/dL (ref <=0.2)
Indirect Bilirubin: 0.4 mg/dL (ref 0.2–1.2)
Total Bilirubin: 0.5 mg/dL (ref 0.2–1.2)
Total Protein: 7.1 g/dL (ref 6.1–8.1)

## 2015-09-17 LAB — BASIC METABOLIC PANEL WITH GFR
BUN: 11 mg/dL (ref 7–25)
CO2: 29 mmol/L (ref 20–31)
Calcium: 9.3 mg/dL (ref 8.6–10.4)
Chloride: 104 mmol/L (ref 98–110)
Creat: 0.81 mg/dL (ref 0.60–0.93)
GFR, Est African American: 85 mL/min (ref >=60)
GFR, Est Non African American: 74 mL/min (ref >=60)
Glucose, Bld: 90 mg/dL (ref 65–99)
Potassium: 4.1 mmol/L (ref 3.5–5.3)
Sodium: 138 mmol/L (ref 135–146)

## 2015-09-17 LAB — LIPID PANEL
Cholesterol: 215 mg/dL — ABNORMAL HIGH (ref 125–200)
HDL: 82 mg/dL (ref >=46)
LDL Cholesterol: 113 mg/dL (ref <130)
Total CHOL/HDL Ratio: 2.6 Ratio (ref <=5.0)
Triglycerides: 102 mg/dL (ref <150)
VLDL: 20 mg/dL (ref <30)

## 2015-09-17 LAB — TSH: TSH: 4.819 u[IU]/mL — ABNORMAL HIGH (ref 0.350–4.500)

## 2015-09-17 MED ORDER — MELOXICAM 15 MG PO TABS: ORAL_TABLET | ORAL | Status: DC

## 2015-09-17 NOTE — Progress Notes (Signed)
Subjective:    Patient ID: Kimberly Wilcox, female    DOB: 20-Jan-1946, 70 y.o.   MRN: [id number]  HPI  Patient presents to the office for evaluation of right sided costovertebral pain which has been going on for the past year.  The pain is dull and aching pain and nagging.  She reports that the pain is intermittent.  She reports that she does have some water brash and she also has some nausea and heart burn.  She is taking protonix currently on an as needed basis.  She reports that she doesn't like these medications.  She thinks that they just cover it up.  She has had a long standing history of reflux and heart burn.  She thinks it goes with the pain.  No activities out of the ordinary.  She does still have a gallbladder.  She reports that she has had gallbladder attacks in the past, but hasn't done anything about it.  She had a completely normal gallbladder ultrasound which was done on 03/05/14.  She reports that she is tired of complaining.  No relieving factors identified.  She has not taken anything other than her reflux medications.   She reports that she has been constipated and has been trying drinking fruit and water to help with it. She has been eating prunes. She feels like stool is like little balls.       She is also here for a urine recheck.  She was diagnosed with UTI 1 month ago. She reports that she still feels like she has some frequency and pressure in the suprapubic area.      Review of Systems  Constitutional: Negative for fever, chills and fatigue.  Respiratory: Negative for chest tightness and shortness of breath.   Cardiovascular: Negative for chest pain, palpitations and leg swelling.  Gastrointestinal: Positive for nausea, abdominal pain and constipation. Negative for vomiting, diarrhea and blood in stool.  Genitourinary: Negative for dysuria, urgency, frequency, hematuria and difficulty urinating.  Neurological: Negative for dizziness, weakness and numbness.         Objective:   Physical Exam  Constitutional: She is oriented to person, place, and time. She appears well-developed and well-nourished. No distress.  HENT:  Head: Normocephalic.  Mouth/Throat: Oropharynx is clear and moist. No oropharyngeal exudate.  Eyes: Conjunctivae are normal. No scleral icterus.  Neck: Normal range of motion. Neck supple. No JVD present. No thyromegaly present.  Cardiovascular: Normal rate, regular rhythm, normal heart sounds and intact distal pulses.  Exam reveals no gallop and no friction rub.   No murmur heard. Pulmonary/Chest: Effort normal and breath sounds normal. No respiratory distress. She has no wheezes. She has no rales. She exhibits tenderness.    Abdominal: Soft. Normal appearance and bowel sounds are normal. She exhibits no distension and no mass. There is tenderness in the suprapubic area. There is no rebound, no guarding and no CVA tenderness.  Musculoskeletal: Normal range of motion.  Lymphadenopathy:    She has no cervical adenopathy.  Neurological: She is alert and oriented to person, place, and time.  Skin: Skin is warm and dry. She is not diaphoretic.  Psychiatric: She has a normal mood and affect. Her behavior is normal. Judgment and thought content normal.  Vitals reviewed.   Filed Vitals:   09/17/15 0856  BP: 128/76  Pulse: 74  Temp: 98.2 F (36.8 C)  Resp: 16         Assessment & Plan:    1. Labile  hypertension -well controlled currently - TSH  2. Prediabetes  - Hemoglobin A1c  3. Hyperlipidemia  - Lipid panel  4. Vitamin D deficiency -cont supplement -additional supplmental calcium  5. Medication management  - CBC with Differential/Platelet - BASIC METABOLIC PANEL WITH GFR - Hepatic function panel  6. Acute cystitis without hematuria  - Urinalysis, Routine w reflex microscopic (not at [facility]) - Culture, Urine  7. Costochondritis -mobic daily x 2 weeks  8. Need for prophylactic vaccination against  Streptococcus pneumoniae (pneumococcus)  - Pneumococcal conjugate vaccine 13-valent

## 2015-09-18 LAB — URINALYSIS, ROUTINE W REFLEX MICROSCOPIC
Bilirubin Urine: NEGATIVE
Glucose, UA: NEGATIVE
Hgb urine dipstick: NEGATIVE
Ketones, ur: NEGATIVE
Leukocytes, UA: NEGATIVE
Nitrite: NEGATIVE
Protein, ur: NEGATIVE
Specific Gravity, Urine: 1.004 (ref 1.001–1.035)
pH: 6.5 (ref 5.0–8.0)

## 2015-09-18 LAB — HEMOGLOBIN A1C
Hgb A1c MFr Bld: 5.7 % — ABNORMAL HIGH (ref <5.7)
Mean Plasma Glucose: 117 mg/dL — ABNORMAL HIGH (ref <117)

## 2015-09-19 LAB — URINE CULTURE
Colony Count: NO GROWTH
Organism ID, Bacteria: NO GROWTH

## 2015-09-27 ENCOUNTER — Other Ambulatory Visit: Payer: Self-pay | Admitting: *Deleted

## 2015-09-27 MED ORDER — PANTOPRAZOLE SODIUM 40 MG PO TBEC
DELAYED_RELEASE_TABLET | ORAL | Status: DC
Start: 2015-09-27 — End: 2016-10-05

## 2015-10-14 ENCOUNTER — Encounter: Payer: Self-pay | Admitting: Physician Assistant

## 2015-10-29 ENCOUNTER — Encounter: Payer: Self-pay | Admitting: Physician Assistant

## 2015-12-27 ENCOUNTER — Encounter: Payer: Self-pay | Admitting: Internal Medicine

## 2015-12-27 ENCOUNTER — Ambulatory Visit (INDEPENDENT_AMBULATORY_CARE_PROVIDER_SITE_OTHER): Payer: Medicare Other | Admitting: Internal Medicine

## 2015-12-27 VITALS — BP 120/84 | HR 76 | Temp 97.5°F | Resp 16 | Ht 61.0 in | Wt 145.4 lb

## 2015-12-27 DIAGNOSIS — E039 Hypothyroidism, unspecified: Secondary | ICD-10-CM

## 2015-12-27 DIAGNOSIS — R1011 Right upper quadrant pain: Secondary | ICD-10-CM | POA: Diagnosis not present

## 2015-12-27 DIAGNOSIS — K2100 Gastro-esophageal reflux disease with esophagitis, without bleeding: Secondary | ICD-10-CM

## 2015-12-27 LAB — CBC WITH DIFFERENTIAL/PLATELET
Basophils Absolute: 0 cells/uL (ref 0–200)
Basophils Relative: 0 %
Eosinophils Absolute: 104 cells/uL (ref 15–500)
Eosinophils Relative: 2 %
HCT: 41 % (ref 35.0–45.0)
Hemoglobin: 13.4 g/dL (ref 11.7–15.5)
Lymphocytes Relative: 41 %
Lymphs Abs: 2132 cells/uL (ref 850–3900)
MCH: 26.7 pg — ABNORMAL LOW (ref 27.0–33.0)
MCHC: 32.7 g/dL (ref 32.0–36.0)
MCV: 81.8 fL (ref 80.0–100.0)
MPV: 9.8 fL (ref 7.5–12.5)
Monocytes Absolute: 312 cells/uL (ref 200–950)
Monocytes Relative: 6 %
Neutro Abs: 2652 cells/uL (ref 1500–7800)
Neutrophils Relative %: 51 %
Platelets: 273 10*3/uL (ref 140–400)
RBC: 5.01 MIL/uL (ref 3.80–5.10)
RDW: 14.5 % (ref 11.0–15.0)
WBC: 5.2 10*3/uL (ref 3.8–10.8)

## 2015-12-27 LAB — TSH: TSH: 2.48 mIU/L

## 2015-12-27 LAB — AMYLASE: Amylase: 48 U/L (ref 0–105)

## 2015-12-27 NOTE — Patient Instructions (Signed)
Cholecystitis Cholecystitis is inflammation of the gallbladder. It is often called a gallbladder attack. The gallbladder is a pear-shaped organ that lies beneath the liver on the right side of the body. The gallbladder stores bile, which is a fluid that helps the body to digest fats. If bile builds up in your gallbladder, your gallbladder becomes inflamed. This condition may occur suddenly (be acute). Repeat episodes of acute cholecystitis or prolonged episodes may lead to a long-term (chronic) condition. Cholecystitis is serious and it requires treatment.  CAUSES The most common cause of this condition is gallstones. Gallstones can block the tube (duct) that carries bile out of your gallbladder. This causes bile to build up. Other causes of this condition include:  Damage to the gallbladder due to a decrease in blood flow.  Infections in the bile ducts.  Scars or kinks in the bile ducts.  Tumors in the liver, pancreas, or gallbladder. RISK FACTORS This condition is more likely to develop in:  People who have sickle cell disease.  People who take birth control pills or use estrogen.  People who have alcoholic liver disease.  People who have liver cirrhosis.  People who have their nutrition delivered through a vein (parenteral nutrition).  People who do not eat or drink (do fasting) for a long period of time.  People who are obese.  People who have rapid weight loss.  People who are pregnant.  People who have increased triglyceride levels.  People who have pancreatitis. SYMPTOMS Symptoms of this condition include:  Abdominal pain, especially in the upper right area of the abdomen.  Abdominal tenderness or bloating.  Nausea.  Vomiting.  Fever.  Chills.  Yellowing of the skin and the whites of the eyes (jaundice). DIAGNOSIS This condition is diagnosed with a medical history and physical exam. You may also have other tests, including:  Imaging tests, such as:  An  ultrasound of the gallbladder.  A CT scan of the abdomen.  A gallbladder nuclear scan (HIDA scan). This scan allows your health care provider to see the bile moving from your liver to your gallbladder and to your small intestine.  MRI.  Blood tests, such as:  A complete blood count, because the white blood cell count may be higher than normal.  Liver function tests, because some levels may be higher than normal with certain types of gallstones. TREATMENT Treatment may include:  Fasting for a certain amount of time.  IV fluids.  Medicine to treat pain or vomiting.  Antibiotic medicine.  Surgery to remove your gallbladder (cholecystectomy). This may happen immediately or at a later time. HOME CARE INSTRUCTIONS Home care will depend on your treatment. In general:  Take over-the-counter and prescription medicines only as told by your health care provider.  If you were prescribed an antibiotic medicine, take it as told by your health care provider. Do not stop taking the antibiotic even if you start to feel better.  Follow instructions from your health care provider about what to eat or drink. When you are allowed to eat, avoid eating or drinking anything that triggers your symptoms.  Keep all follow-up visits as told by your health care provider. This is important. SEEK MEDICAL CARE IF:  Your pain is not controlled with medicine.  You have a fever. SEEK IMMEDIATE MEDICAL CARE IF:  Your pain moves to another part of your abdomen or to your back.  You continue to have symptoms or you develop new symptoms even with treatment.   This information   is not intended to replace advice given to you by your health care provider. Make sure you discuss any questions you have with your health care provider. ++++++++++++++++++++++++++++++++++++++++++++++++++++++++++++++++++++++++++++++  Increase your Protonix 40 mg to 2 x/day for now with Breakfast & Supper   Gastroesophageal Reflux  Disease, Adult Normally, food travels down the esophagus and stays in the stomach to be digested. However, when a person has gastroesophageal reflux disease (GERD), food and stomach acid move back up into the esophagus. When this happens, the esophagus becomes sore and inflamed. Over time, GERD can create small holes (ulcers) in the lining of the esophagus.  CAUSES This condition is caused by a problem with the muscle between the esophagus and the stomach (lower esophageal sphincter, or LES). Normally, the LES muscle closes after food passes through the esophagus to the stomach. When the LES is weakened or abnormal, it does not close properly, and that allows food and stomach acid to go back up into the esophagus. The LES can be weakened by certain dietary substances, medicines, and medical conditions, including:  Tobacco use.  Pregnancy.  Having a hiatal hernia.  Heavy alcohol use.  Certain foods and beverages, such as coffee, chocolate, onions, and peppermint. RISK FACTORS This condition is more likely to develop in:  People who have an increased body weight.  People who have connective tissue disorders.  People who use NSAID medicines. SYMPTOMS Symptoms of this condition include:  Heartburn.  Difficult or painful swallowing.  The feeling of having a lump in the throat.  Abitter taste in the mouth.  Bad breath.  Having a large amount of saliva.  Having an upset or bloated stomach.  Belching.  Chest pain.  Shortness of breath or wheezing.  Ongoing (chronic) cough or a night-time cough.  Wearing away of tooth enamel.  Weight loss. Different conditions can cause chest pain. Make sure to see your health care provider if you experience chest pain. DIAGNOSIS Your health care provider will take a medical history and perform a physical exam. To determine if you have mild or severe GERD, your health care provider may also monitor how you respond to treatment. You may  also have other tests, including:  An endoscopy toexamine your stomach and esophagus with a small camera.  A test thatmeasures the acidity level in your esophagus.  A test thatmeasures how much pressure is on your esophagus.  A barium swallow or modified barium swallow to show the shape, size, and functioning of your esophagus. TREATMENT The goal of treatment is to help relieve your symptoms and to prevent complications. Treatment for this condition may vary depending on how severe your symptoms are. Your health care provider may recommend:  Changes to your diet.  Medicine.  Surgery. HOME CARE INSTRUCTIONS Diet  Follow a diet as recommended by your health care provider. This may involve avoiding foods and drinks such as:  Coffee and tea (with or without caffeine).  Drinks that containalcohol.  Energy drinks and sports drinks.  Carbonated drinks or sodas.  Chocolate and cocoa.  Peppermint and mint flavorings.  Garlic and onions.  Horseradish.  Spicy and acidic foods, including peppers, chili powder, curry powder, vinegar, hot sauces, and barbecue sauce.  Citrus fruit juices and citrus fruits, such as oranges, lemons, and limes.  Tomato-based foods, such as red sauce, chili, salsa, and pizza with red sauce.  Fried and fatty foods, such as donuts, french fries, potato chips, and high-fat dressings.  High-fat meats, such as hot dogs and  fatty cuts of red and white meats, such as rib eye steak, sausage, ham, and bacon.  High-fat dairy items, such as whole milk, butter, and cream cheese.  Eat small, frequent meals instead of large meals.  Avoid drinking large amounts of liquid with your meals.  Avoid eating meals during the 2-3 hours before bedtime.  Avoid lying down right after you eat.  Do not exercise right after you eat. General Instructions  Pay attention to any changes in your symptoms.  Take over-the-counter and prescription medicines only as told  by your health care provider. Do not take aspirin, ibuprofen, or other NSAIDs unless your health care provider told you to do so.  Do not use any tobacco products, including cigarettes, chewing tobacco, and e-cigarettes. If you need help quitting, ask your health care provider.  Wear loose-fitting clothing. Do not wear anything tight around your waist that causes pressure on your abdomen.  Raise (elevate) the head of your bed 6 inches (15cm).  Try to reduce your stress, such as with yoga or meditation. If you need help reducing stress, ask your health care provider.  If you are overweight, reduce your weight to an amount that is healthy for you. Ask your health care provider for guidance about a safe weight loss goal.  Keep all follow-up visits as told by your health care provider. This is important. SEEK MEDICAL CARE IF:  You have new symptoms.  You have unexplained weight loss.  You have difficulty swallowing, or it hurts to swallow.  You have wheezing or a persistent cough.  Your symptoms do not improve with treatment.  You have a hoarse voice. SEEK IMMEDIATE MEDICAL CARE IF:  You have pain in your arms, neck, jaw, teeth, or back.  You feel sweaty, dizzy, or light-headed.  You have chest pain or shortness of breath.  You vomit and your vomit looks like blood or coffee grounds.  You faint.  Your stool is bloody or black.  You cannot swallow, drink, or eat.  ++++++++++++++++++++++++++++++++++++++++++++++++++++++++++++++++++++++++  Food Choices for Gastroesophageal Reflux Disease, Adult When you have gastroesophageal reflux disease (GERD), the foods you eat and your eating habits are very important. Choosing the right foods can help ease the discomfort of GERD. WHAT GENERAL GUIDELINES DO I NEED TO FOLLOW?  Choose fruits, vegetables, whole grains, low-fat dairy products, and low-fat meat, fish, and poultry.  Limit fats such as oils, salad dressings, butter, nuts, and  avocado.  Keep a food diary to identify foods that cause symptoms.  Avoid foods that cause reflux. These may be different for different people.  Eat frequent small meals instead of three large meals each day.  Eat your meals slowly, in a relaxed setting.  Limit fried foods.  Cook foods using methods other than frying.  Avoid drinking alcohol.  Avoid drinking large amounts of liquids with your meals.  Avoid bending over or lying down until 2-3 hours after eating. WHAT FOODS ARE NOT RECOMMENDED? The following are some foods and drinks that may worsen your symptoms: Vegetables Tomatoes. Tomato juice. Tomato and spaghetti sauce. Chili peppers. Onion and garlic. Horseradish. Fruits Oranges, grapefruit, and lemon (fruit and juice). Meats High-fat meats, fish, and poultry. This includes hot dogs, ribs, ham, sausage, salami, and bacon. Dairy Whole milk and chocolate milk. Sour cream. Cream. Butter. Ice cream. Cream cheese.  Beverages Coffee and tea, with or without caffeine. Carbonated beverages or energy drinks. Condiments Hot sauce. Barbecue sauce.  Sweets/Desserts Chocolate and cocoa. Donuts. Peppermint and spearmint.  Fats and Oils High-fat foods, including [place] fries and potato chips. Other Vinegar. Strong spices, such as black pepper, white pepper, red pepper, cayenne, curry powder, cloves, ginger, and chili powder. The items listed above may not be a complete list of foods and beverages to avoid. Contact your dietitian for more information.   This information is not intended to replace advice given to you by your health care provider. Make sure you discuss any questions you have with your health care provider.   Document Released: 08/10/2005 Document Revised: 08/31/2014 Document Reviewed: 06/14/2013 Elsevier Interactive Patient Education [employer].

## 2015-12-27 NOTE — Progress Notes (Signed)
  Subjective:    Patient ID: Kimberly Wilcox, female    DOB: 1945/08/26, 70 y.o.   MRN: [id number]  HPI  This very nice 70 yo MWF presents with a 1 month hx/o EG to RUQ pains and PC bloating and also has hx/oGERD and has been on Protonix. She does report a burning and choking sensation in the "back" of her throat. In 2015 she had a negative GB U/S. Also she had recent sl elevated TSH.   Medication Sig  . aspirin 81 MG tablet Take 81 mg by mouth.   . SUPER B COMPLEX Take by mouth daily.  Marland Kitchen VITAMIN D  Take 1 tablet by mouth daily.  . diazepam 2 MG tablet take 1/2 to 1 tablet by mouth UP TO three times a day if needed  . XALATAN ophth soln 1 drop into both eyes once daily  . meloxicam  15 MG tablet Take 1 tablet daily with your largest meal of the day.  Take x 14 days.  Repeat if necessary.  . ondansetron (ZOFRAN ODT) 8 MG  8mg  ODT q4 hours prn nausea  . pantoprazole ( 40 MG tablet take 1 tablet by mouth once daily for GERD/HEART BURN  . NITROSTAT 0.4 MG SL  Place 1 tablet (0.4 mg total) under the tongue every 5 (five) minutes as needed for chest pain.   Allergies  Allergen Reactions  . Nitrofurantoin Monohyd Macro     "flu like" symptoms   . Sulfa Antibiotics Hives   Past Medical History  Diagnosis Date  . GERD (gastroesophageal reflux disease)   . Chest discomfort   . Hiatal hernia   . Anxiety   . Vitamin D deficiency   . Osteoporosis   . Renal cell cancer ([place]) 1999    LEFT NEPHRECTOMY  . Hypertension     labile, normal cath 2013   Past Surgical History  Procedure Laterality Date  . Nephrectomy Left 1999    secondary to cancer  . Left oophorectomy  1985    Benign tumor  . Tubal ligation     Review of Systems  10 point systems review negative except as above.    Objective:   Physical Exam  BP 120/84 mmHg  Pulse 76  Temp(Src) 97.5 F (36.4 C)  Resp 16  Ht 5\' 1"  (1.549 m)  Wt 145 lb 6.4 oz (65.953 kg)  BMI 27.49 kg/m2  HEENT - Eac's patent. TM's Nl. EOM's full.  PERRLA. NasoOroPharynx clear. Neck - supple. Nl Thyroid. Carotids 2+ & No bruits, nodes, JVD Chest - Clear equal BS w/o Rales, rhonchi, wheezes. Cor - Nl HS. RRR w/o sig MGR. PP 1(+). No edema. Abd - Soft with Eg tenderness and at the RUQ w/o guarding or RB. MS- FROM w/o deformities. Muscle power, tone and bulk Nl. Gait Nl. Neuro - No obvious Cr N abnormalities. Sensory, motor and Cerebellar functions appear Nl w/o focal abnormalities.    Assessment & Plan:   1. Right upper quadrant pain  - CBC with Differential/Platelet - Amylase - US Abdomen Limited RUQ; Future  2. Gastroesophageal reflux disease with esophagitis  - recc increase  protonix to bid for now.   3. Hypothyroidism, unspecified   - TSH

## 2016-01-02 ENCOUNTER — Ambulatory Visit
Admission: RE | Admit: 2016-01-02 | Discharge: 2016-01-02 | Disposition: A | Discharge status: Home / Self Care | Payer: BC Managed Care – PPO | Source: Ambulatory Visit | Attending: Internal Medicine | Admitting: Internal Medicine

## 2016-01-02 DIAGNOSIS — R1011 Right upper quadrant pain: Secondary | ICD-10-CM

## 2016-01-07 ENCOUNTER — Telehealth: Payer: Self-pay | Admitting: *Deleted

## 2016-01-07 NOTE — Telephone Encounter (Signed)
Pt aware of normal U/S of gallbladder results.  The patient continues to have abdominal  pain and nausea.  Per Dr Kimberly Wilcox, patient needs to see her GI doctor.

## 2016-01-14 DIAGNOSIS — Z1211 Encounter for screening for malignant neoplasm of colon: Secondary | ICD-10-CM | POA: Diagnosis not present

## 2016-01-14 DIAGNOSIS — R1011 Right upper quadrant pain: Secondary | ICD-10-CM | POA: Diagnosis not present

## 2016-02-03 ENCOUNTER — Other Ambulatory Visit: Payer: Self-pay | Admitting: Gastroenterology

## 2016-02-03 DIAGNOSIS — R1011 Right upper quadrant pain: Secondary | ICD-10-CM | POA: Diagnosis not present

## 2016-02-03 DIAGNOSIS — K3189 Other diseases of stomach and duodenum: Secondary | ICD-10-CM | POA: Diagnosis not present

## 2016-02-03 DIAGNOSIS — Z1211 Encounter for screening for malignant neoplasm of colon: Secondary | ICD-10-CM | POA: Diagnosis not present

## 2016-02-03 DIAGNOSIS — K2960 Other gastritis without bleeding: Secondary | ICD-10-CM | POA: Diagnosis not present

## 2016-02-03 LAB — HM COLONOSCOPY

## 2016-03-01 ENCOUNTER — Encounter: Payer: Self-pay | Admitting: *Deleted

## 2016-03-13 ENCOUNTER — Ambulatory Visit (INDEPENDENT_AMBULATORY_CARE_PROVIDER_SITE_OTHER): Payer: Medicare Other | Admitting: Physician Assistant

## 2016-03-13 ENCOUNTER — Encounter: Payer: Self-pay | Admitting: Physician Assistant

## 2016-03-13 VITALS — BP 126/80 | HR 64 | Temp 98.2°F | Resp 14 | Ht 61.0 in | Wt 147.6 lb

## 2016-03-13 DIAGNOSIS — F419 Anxiety disorder, unspecified: Secondary | ICD-10-CM

## 2016-03-13 DIAGNOSIS — G629 Polyneuropathy, unspecified: Secondary | ICD-10-CM

## 2016-03-13 DIAGNOSIS — K2100 Gastro-esophageal reflux disease with esophagitis, without bleeding: Secondary | ICD-10-CM

## 2016-03-13 DIAGNOSIS — R413 Other amnesia: Secondary | ICD-10-CM | POA: Diagnosis not present

## 2016-03-13 DIAGNOSIS — E559 Vitamin D deficiency, unspecified: Secondary | ICD-10-CM | POA: Diagnosis not present

## 2016-03-13 DIAGNOSIS — R0989 Other specified symptoms and signs involving the circulatory and respiratory systems: Secondary | ICD-10-CM

## 2016-03-13 DIAGNOSIS — R7303 Prediabetes: Secondary | ICD-10-CM

## 2016-03-13 DIAGNOSIS — I10 Essential (primary) hypertension: Secondary | ICD-10-CM

## 2016-03-13 DIAGNOSIS — R6889 Other general symptoms and signs: Secondary | ICD-10-CM

## 2016-03-13 DIAGNOSIS — Z136 Encounter for screening for cardiovascular disorders: Secondary | ICD-10-CM | POA: Diagnosis not present

## 2016-03-13 DIAGNOSIS — E785 Hyperlipidemia, unspecified: Secondary | ICD-10-CM | POA: Diagnosis not present

## 2016-03-13 DIAGNOSIS — R350 Frequency of micturition: Secondary | ICD-10-CM | POA: Diagnosis not present

## 2016-03-13 DIAGNOSIS — Z85528 Personal history of other malignant neoplasm of kidney: Secondary | ICD-10-CM | POA: Diagnosis not present

## 2016-03-13 DIAGNOSIS — M810 Age-related osteoporosis without current pathological fracture: Secondary | ICD-10-CM | POA: Diagnosis not present

## 2016-03-13 DIAGNOSIS — K449 Diaphragmatic hernia without obstruction or gangrene: Secondary | ICD-10-CM | POA: Diagnosis not present

## 2016-03-13 DIAGNOSIS — Z0001 Encounter for general adult medical examination with abnormal findings: Secondary | ICD-10-CM | POA: Diagnosis not present

## 2016-03-13 DIAGNOSIS — Z0000 Encounter for general adult medical examination without abnormal findings: Secondary | ICD-10-CM

## 2016-03-13 LAB — HEPATIC FUNCTION PANEL
ALT: 5 U/L — ABNORMAL LOW (ref 6–29)
AST: 19 U/L (ref 10–35)
Albumin: 4.1 g/dL (ref 3.6–5.1)
Alkaline Phosphatase: 64 U/L (ref 33–130)
Bilirubin, Direct: 0.1 mg/dL (ref <=0.2)
Indirect Bilirubin: 0.4 mg/dL (ref 0.2–1.2)
Total Bilirubin: 0.5 mg/dL (ref 0.2–1.2)
Total Protein: 6.8 g/dL (ref 6.1–8.1)

## 2016-03-13 LAB — LIPID PANEL
Cholesterol: 209 mg/dL — ABNORMAL HIGH (ref 125–200)
HDL: 91 mg/dL (ref >=46)
LDL Cholesterol: 100 mg/dL (ref <130)
Total CHOL/HDL Ratio: 2.3 Ratio (ref <=5.0)
Triglycerides: 88 mg/dL (ref <150)
VLDL: 18 mg/dL (ref <30)

## 2016-03-13 LAB — CBC WITH DIFFERENTIAL/PLATELET
Basophils Absolute: 0 cells/uL (ref 0–200)
Basophils Relative: 0 %
Eosinophils Absolute: 124 cells/uL (ref 15–500)
Eosinophils Relative: 2 %
HCT: 39.3 % (ref 35.0–45.0)
Hemoglobin: 12.9 g/dL (ref 11.7–15.5)
Lymphocytes Relative: 43 %
Lymphs Abs: 2666 cells/uL (ref 850–3900)
MCH: 27 pg (ref 27.0–33.0)
MCHC: 32.8 g/dL (ref 32.0–36.0)
MCV: 82.2 fL (ref 80.0–100.0)
MPV: 9.6 fL (ref 7.5–12.5)
Monocytes Absolute: 372 cells/uL (ref 200–950)
Monocytes Relative: 6 %
Neutro Abs: 3038 cells/uL (ref 1500–7800)
Neutrophils Relative %: 49 %
Platelets: 293 10*3/uL (ref 140–400)
RBC: 4.78 MIL/uL (ref 3.80–5.10)
RDW: 14 % (ref 11.0–15.0)
WBC: 6.2 10*3/uL (ref 3.8–10.8)

## 2016-03-13 LAB — BASIC METABOLIC PANEL WITH GFR
BUN: 14 mg/dL (ref 7–25)
CO2: 27 mmol/L (ref 20–31)
Calcium: 9 mg/dL (ref 8.6–10.4)
Chloride: 103 mmol/L (ref 98–110)
Creat: 0.91 mg/dL (ref 0.60–0.93)
GFR, Est African American: 73 mL/min (ref >=60)
GFR, Est Non African American: 64 mL/min (ref >=60)
Glucose, Bld: 82 mg/dL (ref 65–99)
Potassium: 4.3 mmol/L (ref 3.5–5.3)
Sodium: 139 mmol/L (ref 135–146)

## 2016-03-13 LAB — HEMOGLOBIN A1C
Hgb A1c MFr Bld: 5.6 % (ref <5.7)
Mean Plasma Glucose: 114 mg/dL

## 2016-03-13 LAB — TSH: TSH: 3 mIU/L

## 2016-03-13 LAB — MAGNESIUM: Magnesium: 1.8 mg/dL (ref 1.5–2.5)

## 2016-03-13 MED ORDER — CIPROFLOXACIN HCL 500 MG PO TABS: 500.0000 mg | ORAL_TABLET | Freq: Two times a day (BID) | ORAL | Status: DC

## 2016-03-13 MED ORDER — DIAZEPAM 2 MG PO TABS: ORAL_TABLET | ORAL | Status: DC

## 2016-03-13 NOTE — Progress Notes (Signed)
MEDICARE ANNUAL WELLNESS VISIT AND CPE  Assessment:   1. Labile hypertension - continue medications, DASH diet, exercise and monitor at home. Call if greater than 130/80.  - CBC with Differential/Platelet - BASIC METABOLIC PANEL WITH GFR - Hepatic function panel - TSH - Microalbumin / creatinine urine ratio - EKG 12-Lead  2. Prediabetes Discussed general issues about diabetes pathophysiology and management., Educational material distributed., Suggested low cholesterol diet., Encouraged aerobic exercise., Discussed foot care., Reminded to get yearly retinal exam. - Hemoglobin A1c  3. Hyperlipidemia -continue medications, check lipids, decrease fatty foods, increase activity.  - Lipid panel  4. Vitamin D deficiency Continue supplement  5. Memory changes Check labs/urine, stable at this time - Magnesium  6. Osteoporosis - get dexa next year, continue Vit D and Ca, weight bearing exercises  7. Neuropathy ([place]) Do regular feet cheeks, no meds needed at this time.   8. Gastroesophageal reflux disease with esophagitis Continue PPI/H2 blocker, diet discussed  9. Hiatal hernia Continue PPI/H2 blocker, diet discussed  10. History of renal cell cancer Monitor urine/kidney function  11. Anxiety continue medications, stress management techniques discussed, increase water, good sleep hygiene discussed, increase exercise, and increase veggies.  - diazepam (VALIUM) 2 MG tablet; take 1/2 to 1 tablet by mouth UP TO three times a day if needed  Dispense: 40 tablet; Refill: 1  12. Urinary frequency cipro sent in, will call with results - Urinalysis, Routine w reflex microscopic (not at [facility]) - Urine culture  13. Encounter for Medicare annual wellness exam  Over 40 minutes of exam, counseling, chart review and critical decision making was performed Future Appointments Date Time Provider [place]  03/17/2017 10:00 AM Vicie Mutters, PA-C [id number] None     Plan:    During the course of the visit the patient was educated and counseled about appropriate screening and preventive services including:    Pneumococcal vaccine   Prevnar 13  Influenza vaccine  Td vaccine  Screening electrocardiogram  Bone densitometry screening  Colorectal cancer screening  Diabetes screening  Glaucoma screening  Nutrition counseling   Advanced directives: requested   Subjective:  Kimberly Wilcox is a 70 y.o. female who presents for Medicare Annual Wellness Visit and complete physical.    Her blood pressure has been controlled at home, today their BP is BP: 126/80 mmHg  She does workout, walking and states that her neuropathy has improved. She denies chest pain, shortness of breath, dizziness.  She is not on cholesterol medication and denies myalgias. Her cholesterol is at goal. The cholesterol last visit was:   Lab Results  Component Value Date   CHOL 215* 09/17/2015   HDL 82 09/17/2015   LDLCALC 113 09/17/2015   TRIG 102 09/17/2015   CHOLHDL 2.6 09/17/2015  She has history of anxiety, can not tolerate SSRI, will take valium 1/2 pill as needed for anxiety, 1-2 days a week. She states things have calmed down, she has retired in June due to her vision.   She has been working on diet and exercise for prediabetes, and denies paresthesia of the feet, polydipsia, polyuria and visual disturbances. Last A1C in the office was:  Lab Results  Component Value Date   HGBA1C 5.7* 09/17/2015   Lab Results  Component Value Date   GFRNONAA 74 09/17/2015  Patient is on Vitamin D supplement.   Lab Results  Component Value Date   VD25OH 52 10/29/2014  BMI is Body mass index is 27.9 kg/(m^2)., she is working on diet  and exercise. Wt Readings from Last 3 Encounters:  03/13/16 147 lb 9.6 oz (66.951 kg)  12/27/15 145 lb 6.4 oz (65.953 kg)  09/17/15 149 lb (67.586 kg)       Medication Review: Current Outpatient Prescriptions on File Prior to Visit   Medication Sig Dispense Refill  . aspirin 81 MG tablet Take 81 mg by mouth.     . B Complex-C (SUPER B COMPLEX PO) Take by mouth daily.    . Cholecalciferol (VITAMIN D PO) Take 1 tablet by mouth daily.    . diazepam (VALIUM) 2 MG tablet take 1/2 to 1 tablet by mouth UP TO three times a day if needed 40 tablet 1  . latanoprost (XALATAN) 0.005 % ophthalmic solution 1 drop into both eyes once daily    . meloxicam (MOBIC) 15 MG tablet Take 1 tablet daily with your largest meal of the day.  Take x 14 days.  Repeat if necessary. 30 tablet 0  . ondansetron (ZOFRAN ODT) 8 MG disintegrating tablet 8mg  ODT q4 hours prn nausea 20 tablet 0  . pantoprazole (PROTONIX) 40 MG tablet take 1 tablet by mouth once daily for GERD/HEART BURN 30 tablet 5  . nitroGLYCERIN (NITROSTAT) 0.4 MG SL tablet Place 1 tablet (0.4 mg total) under the tongue every 5 (five) minutes as needed for chest pain. 25 tablet 4   No current facility-administered medications on file prior to visit.    Allergies  Allergen Reactions  . Nitrofurantoin Monohyd Macro     "flu like" symptoms   . Sulfa Antibiotics Hives    Current Problems (verified) Patient Active Problem List   Diagnosis Date Noted  . Neuropathy ([place]) 01/09/2015  . Memory changes 01/09/2015  . Hyperlipidemia 10/29/2014  . Prediabetes 10/29/2014  . History of renal cell cancer 10/29/2014  . Labile hypertension 10/29/2014  . Osteoporosis 10/29/2014  . Vitamin D deficiency   . GERD (gastroesophageal reflux disease)   . Anxiety   . Hiatal hernia     Screening Tests Immunization History  Administered Date(s) Administered  . Pneumococcal Conjugate-13 09/17/2015  . Pneumococcal Polysaccharide-23 10/04/2012  . Td 10/04/2009   Preventative care: Last colonoscopy:2017, Dr. Amedeo Plenty EGD 2017 Last mammogram: 10/2014, making appointment Last pap smear/pelvic exam: 2011   DEXA: 10/2014 MRI brain 10/2014 Ct AB 99991111 US carotids LICA A999333  Prior  vaccinations: TD or Tdap: 2011  Influenza: declines Pneumococcal: 2014 Prevnar13: 2017 Shingles/Zostavax: check insurance  Names of Other Physician/Practitioners you currently use: 1. [facility] here for primary care 2. Dr. Katy Fitch, eye doctor, last visit April 2017 3. Gwyndolyn Saxon, dentist, last visit 2015 Patient Care Team: Unk Pinto, MD as PCP - General (Internal Medicine) Warden Fillers, MD as Consulting Physician (Optometry) Josue Hector, MD as Consulting Physician (Cardiology) Alexis Frock, MD as Consulting Physician (Urology) Teena Irani, MD as Consulting Physician (Gastroenterology)  SURGICAL HISTORY She  has past surgical history that includes Nephrectomy (Left, 1999); Left oophorectomy (1985); and Tubal ligation. FAMILY HISTORY Her family history includes Pneumonia in her father. SOCIAL HISTORY She  reports that she quit smoking about 31 years ago. She has never used smokeless tobacco. She reports that she drinks alcohol. She reports that she does not use illicit drugs.   MEDICARE WELLNESS OBJECTIVES: Physical activity: Current Exercise Habits: The patient does not participate in regular exercise at present Cardiac risk factors: Cardiac Risk Factors include: advanced age (>77men, >69 women);dyslipidemia;hypertension;sedentary lifestyle Depression/mood screen:   Depression screen [facility] 2/9 03/13/2016  Decreased Interest 0  Down, Depressed, Hopeless 0  PHQ - 2 Score 0    ADLs:  In your present state of health, do you have any difficulty performing the following activities: 03/13/2016 12/27/2015  Hearing? N N  Vision? N N  Difficulty concentrating or making decisions? N N  Walking or climbing stairs? N N  Dressing or bathing? N N  Doing errands, shopping? N N     Cognitive Testing  Alert? Yes  Normal Appearance?Yes  Oriented to person? Yes  Place? Yes   Time? Yes  Recall of three objects?  Yes  Can perform simple  calculations? Yes  Displays appropriate judgment?Yes  Can read the correct time from a watch face?Yes  EOL planning: Does patient have an advance directive?: No Would patient like information on creating an advanced directive?: Yes - Educational materials given  Review of Systems  Constitutional: Negative.   HENT: Negative.   Eyes: Positive for blurred vision. Negative for double vision, photophobia, pain, discharge and redness.  Respiratory: Negative.   Cardiovascular: Negative.   Gastrointestinal: Positive for heartburn and abdominal pain. Negative for nausea, vomiting, diarrhea, constipation, blood in stool and melena.  Genitourinary: Positive for frequency. Negative for dysuria, urgency, hematuria and flank pain.  Musculoskeletal: Negative.   Skin: Negative.   Neurological: Positive for sensory change (bilateral feet). Negative for dizziness, tingling, tremors, speech change, focal weakness, seizures and loss of consciousness.  Psychiatric/Behavioral: Negative for depression, suicidal ideas, hallucinations, memory loss and substance abuse. The patient is nervous/anxious and has insomnia.      Objective:     Today's Vitals   03/13/16 0936  BP: 126/80  Pulse: 64  Temp: 98.2 F (36.8 C)  TempSrc: Temporal  Resp: 14  Height: 5\' 1"  (1.549 m)  Weight: 147 lb 9.6 oz (66.951 kg)  SpO2: 97%  PainSc: 0-No pain   Body mass index is 27.9 kg/(m^2).  General appearance: alert, no distress, WD/WN, female HEENT: normocephalic, sclerae anicteric, TMs pearly, nares patent, no discharge or erythema, pharynx normal Oral cavity: MMM, no lesions Neck: supple, no lymphadenopathy, no thyromegaly, no masses Heart: RRR, normal S1, S2, no murmurs Lungs: CTA bilaterally, no wheezes, rhonchi, or rales Abdomen: +bs, soft, non tender, non distended, no masses, no hepatomegaly, no splenomegaly Musculoskeletal: nontender, no swelling, no obvious deformity Extremities: no edema, no cyanosis, no  clubbing Pulses: 2+ symmetric, upper and lower extremities, normal cap refill Neurological: alert, oriented x 3, CN2-12 intact, strength normal upper extremities and lower extremities, sensation decreased bilateral feet, DTRs 2+ throughout, no cerebellar signs, gait normal Psychiatric: normal affect, behavior normal, pleasant   Medicare Attestation I have personally reviewed: The patient's medical and social history Their use of alcohol, tobacco or illicit drugs Their current medications and supplements The patient's functional ability including ADLs,fall risks, home safety risks, cognitive, and hearing and visual impairment Diet and physical activities Evidence for depression or mood disorders  The patient's weight, height, BMI, and visual acuity have been recorded in the chart.  I have made referrals, counseling, and provided education to the patient based on review of the above and I have provided the patient with a written personalized care plan for preventive services.     Vicie Mutters, PA-C   03/13/2016

## 2016-03-13 NOTE — Patient Instructions (Signed)

## 2016-03-14 LAB — URINALYSIS, ROUTINE W REFLEX MICROSCOPIC
Bilirubin Urine: NEGATIVE
Glucose, UA: NEGATIVE
Hgb urine dipstick: NEGATIVE
Ketones, ur: NEGATIVE
Leukocytes, UA: NEGATIVE
Nitrite: NEGATIVE
Protein, ur: NEGATIVE
Specific Gravity, Urine: 1.007 (ref 1.001–1.035)
pH: 7 (ref 5.0–8.0)

## 2016-03-14 LAB — MICROALBUMIN / CREATININE URINE RATIO
Creatinine, Urine: 29 mg/dL (ref 20–320)
Microalb, Ur: <0.2 mg/dL

## 2016-03-15 LAB — URINE CULTURE: Organism ID, Bacteria: NO GROWTH

## 2016-03-19 DIAGNOSIS — K219 Gastro-esophageal reflux disease without esophagitis: Secondary | ICD-10-CM | POA: Diagnosis not present

## 2016-03-19 DIAGNOSIS — R1011 Right upper quadrant pain: Secondary | ICD-10-CM | POA: Diagnosis not present

## 2016-03-30 ENCOUNTER — Other Ambulatory Visit: Payer: Self-pay | Admitting: Internal Medicine

## 2016-03-30 DIAGNOSIS — Z1231 Encounter for screening mammogram for malignant neoplasm of breast: Secondary | ICD-10-CM

## 2016-05-19 ENCOUNTER — Ambulatory Visit
Admission: RE | Admit: 2016-05-19 | Discharge: 2016-05-19 | Disposition: A | Discharge status: Home / Self Care | Payer: BC Managed Care – PPO | Source: Ambulatory Visit | Attending: Internal Medicine | Admitting: Internal Medicine

## 2016-05-19 DIAGNOSIS — Z1231 Encounter for screening mammogram for malignant neoplasm of breast: Secondary | ICD-10-CM | POA: Diagnosis not present

## 2016-05-20 ENCOUNTER — Ambulatory Visit: Payer: Self-pay | Admitting: Internal Medicine

## 2016-05-26 ENCOUNTER — Other Ambulatory Visit: Payer: Self-pay | Admitting: Internal Medicine

## 2016-05-26 ENCOUNTER — Telehealth: Payer: Self-pay | Admitting: *Deleted

## 2016-05-26 ENCOUNTER — Encounter (HOSPITAL_COMMUNITY): Payer: Self-pay | Admitting: Emergency Medicine

## 2016-05-26 ENCOUNTER — Emergency Department (HOSPITAL_COMMUNITY): Payer: Medicare Other

## 2016-05-26 ENCOUNTER — Emergency Department (HOSPITAL_COMMUNITY)
Admission: EM | Admit: 2016-05-26 | Discharge: 2016-05-26 | Disposition: A | Discharge status: Home / Self Care | Payer: Medicare Other | Attending: Emergency Medicine | Admitting: Emergency Medicine

## 2016-05-26 ENCOUNTER — Other Ambulatory Visit: Payer: Self-pay

## 2016-05-26 DIAGNOSIS — Z7982 Long term (current) use of aspirin: Secondary | ICD-10-CM | POA: Insufficient documentation

## 2016-05-26 DIAGNOSIS — R079 Chest pain, unspecified: Secondary | ICD-10-CM | POA: Diagnosis not present

## 2016-05-26 DIAGNOSIS — I10 Essential (primary) hypertension: Secondary | ICD-10-CM | POA: Insufficient documentation

## 2016-05-26 DIAGNOSIS — Z87891 Personal history of nicotine dependence: Secondary | ICD-10-CM | POA: Diagnosis not present

## 2016-05-26 DIAGNOSIS — Z85528 Personal history of other malignant neoplasm of kidney: Secondary | ICD-10-CM | POA: Insufficient documentation

## 2016-05-26 DIAGNOSIS — Z79899 Other long term (current) drug therapy: Secondary | ICD-10-CM | POA: Diagnosis not present

## 2016-05-26 LAB — CBC
HCT: 40.3 % (ref 36.0–46.0)
Hemoglobin: 12.7 g/dL (ref 12.0–15.0)
MCH: 26.7 pg (ref 26.0–34.0)
MCHC: 31.5 g/dL (ref 30.0–36.0)
MCV: 84.7 fL (ref 78.0–100.0)
Platelets: 251 10*3/uL (ref 150–400)
RBC: 4.76 MIL/uL (ref 3.87–5.11)
RDW: 13.9 % (ref 11.5–15.5)
WBC: 7.2 10*3/uL (ref 4.0–10.5)

## 2016-05-26 LAB — BASIC METABOLIC PANEL
Anion gap: 9 (ref 5–15)
BUN: 13 mg/dL (ref 6–20)
CO2: 24 mmol/L (ref 22–32)
Calcium: 9.7 mg/dL (ref 8.9–10.3)
Chloride: 105 mmol/L (ref 101–111)
Creatinine, Ser: 0.83 mg/dL (ref 0.44–1.00)
GFR calc Af Amer: >60 mL/min (ref >60)
GFR calc non Af Amer: >60 mL/min (ref >60)
Glucose, Bld: 97 mg/dL (ref 65–99)
Potassium: 4.1 mmol/L (ref 3.5–5.1)
Sodium: 138 mmol/L (ref 135–145)

## 2016-05-26 LAB — I-STAT TROPONIN, ED
Troponin i, poc: 0 ng/mL (ref 0.00–0.08)
Troponin i, poc: 0 ng/mL (ref 0.00–0.08)

## 2016-05-26 LAB — D-DIMER, QUANTITATIVE: D-Dimer, Quant: 0.27 ug/mL-FEU (ref 0.00–0.50)

## 2016-05-26 MED ORDER — ASPIRIN 81 MG PO CHEW
324.0000 mg | CHEWABLE_TABLET | Freq: Once | ORAL | Status: AC
  Administered 2016-05-26: 324 mg via ORAL
  Filled 2016-05-26: qty 4

## 2016-05-26 NOTE — ED Triage Notes (Signed)
Pt states she started having CP this morning that radiated into her back and left side. Pt states she took 1 nitro with relief. Pain went from 8/10-3/10. Pt states she was nauseous. Pt also has been having frequent headaches and jaw pains off and on.

## 2016-05-26 NOTE — ED Notes (Signed)
PT d/c home, ambulatory.

## 2016-05-26 NOTE — Telephone Encounter (Signed)
Patient called with c/o "stabbing" CP, no relief after taking Nitrostat.  Patient notes periods of severe teeth/jaw pain, radiating pain under left breast and arm.  I advised patient with those symptoms she will need to get to the ED asap for immediate E/T to r/o any heart or PE concerns.  Patient expressed understanding.

## 2016-05-26 NOTE — ED Provider Notes (Signed)
Pt's second troponin is negative.  Pt chest pain free.  She wants to go home.  She knows to f/u with her pcp and to return if worse.   Isla Pence, MD 05/26/16 [phone]

## 2016-05-26 NOTE — ED Notes (Signed)
MD at bedside. 

## 2016-05-26 NOTE — Discharge Instructions (Addendum)
PLEASE CONTACT YOUR PRIMARY CARE DOCTOR TO FOLLOW UP THIS WEEK FOR YOUR CHEST PAIN. RETURN IMMEDIATELY IF YOU HAVE ANOTHER EPISODE OF CHEST PAIN, OR IF YOU HAVE SHORTNESS OF BREATH, LIGHTHEADEDNESS, OR OTHER NEW SYMPTOMS.

## 2016-05-26 NOTE — ED Provider Notes (Signed)
[facility] DEPT Provider Note   CSN: [id number] Arrival date & time: 05/26/16  1316     History   Chief Complaint Chief Complaint  Patient presents with  . Chest Pain    HPI Kimberly Wilcox is a 70 y.o. female.  70 year old female with past medical history including hypertension, GERD, renal cell cancer status post nephrectomy who presents with chest pain. The patient states that around 10 AM today, she was walking at the mall in a store when she had a gradual onset of left-sided chest pain. The pain became sharp and severe, radiating to her back and left upper arm. She had associated nausea but no shortness of breath or diaphoresis. The episode was intense for approximately 20 minutes. She took a nitroglycerin and reports that her pain improved. She states that she has had recurrent episodes of chest pain in the past for which she has had upper and lower endoscopy with no findings to explain her pain. However, she reports that this pain has been a burning, reflux like pain whereas today's pain was sharp, left-sided, and very different. She denies any fever, cough/cold symptoms, vomiting, diarrhea, or recent illness. She does note a recent long plane flight approximately 2-3 weeks ago. No leg swelling or pain. No history of blood clots. She does not smoke.   The history is provided by the patient.    Past Medical History:  Diagnosis Date  . Anxiety   . Chest discomfort   . GERD (gastroesophageal reflux disease)   . Hiatal hernia   . Hypertension    labile, normal cath 2013  . Osteoporosis   . Renal cell cancer ([place]) 1999   LEFT NEPHRECTOMY  . Vitamin D deficiency     Patient Active Problem List   Diagnosis Date Noted  . Neuropathy ([place]) 01/09/2015  . Memory changes 01/09/2015  . Hyperlipidemia 10/29/2014  . Prediabetes 10/29/2014  . History of renal cell cancer 10/29/2014  . Labile hypertension 10/29/2014  . Osteoporosis 10/29/2014  . Vitamin D deficiency   .  GERD (gastroesophageal reflux disease)   . Anxiety   . Hiatal hernia     Past Surgical History:  Procedure Laterality Date  . LEFT OOPHORECTOMY  1985   Benign tumor  . NEPHRECTOMY Left 1999   secondary to cancer  . TUBAL LIGATION      OB History    No data available       Home Medications    Prior to Admission medications   Medication Sig Start Date End Date Taking? Authorizing Provider  aspirin 81 MG tablet Take 81 mg by mouth.    Yes Historical Provider, MD  B Complex-C (SUPER B COMPLEX PO) Take by mouth daily.   Yes Historical Provider, MD  Cholecalciferol (VITAMIN D) 2000 units tablet Take 1 tablet by mouth daily.   Yes Historical Provider, MD  diazepam (VALIUM) 2 MG tablet take 1/2 to 1 tablet by mouth UP TO three times a day if needed 03/13/16  Yes Vicie Mutters, PA-C  latanoprost (XALATAN) 0.005 % ophthalmic solution 1 drop into both eyes once daily 11/07/14  Yes Historical Provider, MD  nitroGLYCERIN (NITROSTAT) 0.4 MG SL tablet Place 1 tablet (0.4 mg total) under the tongue every 5 (five) minutes as needed for chest pain. 01/05/12 05/26/16 Yes Josue Hector, MD  ondansetron (ZOFRAN ODT) 8 MG disintegrating tablet 8mg  ODT q4 hours prn nausea 08/13/15  Yes Courtney Forcucci, PA-C  pantoprazole (PROTONIX) 40 MG tablet take 1 tablet  by mouth once daily for GERD/HEART BURN 09/27/15  Yes Unk Pinto, MD  ciprofloxacin (CIPRO) 500 MG tablet Take 1 tablet (500 mg total) by mouth 2 (two) times daily. 7 days Patient not taking: Reported on 05/26/2016 03/13/16   Vicie Mutters, PA-C  meloxicam (MOBIC) 15 MG tablet Take 1 tablet daily with your largest meal of the day.  Take x 14 days.  Repeat if necessary. Patient not taking: Reported on 05/26/2016 09/17/15   Starlyn Skeans, PA-C    Family History Family History  Problem Relation Age of Onset  . Pneumonia Father     Social History Social History  Substance Use Topics  . Smoking status: Former Smoker    Quit date: 10/28/1984   . Smokeless tobacco: Never Used  . Alcohol use 0.0 oz/week     Comment: wine 2-3 glasses once a month     Allergies   Meloxicam; Nitrofurantoin monohyd macro; and Sulfa antibiotics   Review of Systems Review of Systems 10 Systems reviewed and are negative for acute change except as noted in the HPI.   Physical Exam Updated Vital Signs BP 135/77   Pulse 67   Temp 98 F (36.7 C) (Oral)   Resp 12   Ht 5' (1.524 m)   Wt 143 lb (64.9 kg)   SpO2 100%   BMI 27.93 kg/m   Physical Exam  Constitutional: She is oriented to person, place, and time. She appears well-developed and well-nourished. No distress.  HENT:  Head: Normocephalic and atraumatic.  Moist mucous membranes  Eyes: Conjunctivae are normal. Pupils are equal, round, and reactive to light.  Neck: Neck supple.  Cardiovascular: Normal rate, regular rhythm and normal heart sounds.   No murmur heard. Pulmonary/Chest: Effort normal and breath sounds normal.  Abdominal: Soft. Bowel sounds are normal. She exhibits no distension. There is no tenderness.  Musculoskeletal: She exhibits no edema.  Neurological: She is alert and oriented to person, place, and time.  Fluent speech  Skin: Skin is warm and dry.  Psychiatric: She has a normal mood and affect. Judgment normal.  Nursing note and vitals reviewed.    ED Treatments / Results  Labs (all labs ordered are listed, but only abnormal results are displayed) Labs Reviewed  BASIC METABOLIC PANEL  CBC  D-DIMER, QUANTITATIVE (NOT AT [facility])  Randolm Idol, ED    EKG  EKG Interpretation  Date/Time:  Tuesday May 26 2016 13:19:17 EDT Ventricular Rate:  80 PR Interval:  136 QRS Duration: 70 QT Interval:  372 QTC Calculation: 429 R Axis:   73 Text Interpretation:  Normal sinus rhythm Normal ECG No significant change since last tracing Confirmed by Adrean Heitz MD, Yelina Sarratt [phone]) on 05/26/2016 2:29:44 PM       Radiology Dg Chest 2 View  Result Date:  05/26/2016 CLINICAL DATA:  Chest pain EXAM: CHEST  2 VIEW COMPARISON:  01/05/2012 FINDINGS: The heart size and mediastinal contours are within normal limits. Both lungs are clear. The visualized skeletal structures are unremarkable. IMPRESSION: No active cardiopulmonary disease. Electronically Signed   By: Kerby Moors M.D.   On: 05/26/2016 13:54    Procedures Procedures (including critical care time)  Medications Ordered in ED Medications - No data to display   Initial Impression / Assessment and Plan / ED Course  I have reviewed the triage vital signs and the nursing notes.  Pertinent labs & imaging results that were available during my care of the patient were reviewed by me and considered in my medical  decision making (see chart for details).  Clinical Course    Patient with episode of chest pain that was gradual in onset but became sharp, improved after nitroglycerin. She was well-appearing and comfortable on exam with reassuring vital signs. Reassuring EKG. Chest x-ray negative acute. Labwork including initial troponin and d-dimer was unremarkable. I reviewed the patient's chart which shows a normal left heart catheterization on 12/29/11 with no evidence of CAD. The patient does report a long-standing hx of CP of unclear etiology. I have ordered a 2nd troponin. The patient's HEART score is 3-4 and I have discussed options with her including admission for CP rule-out vs close PCP f/u in clinic. She does not want to be admitted and would rather follow up in clinic. I extensively reviewed return precautions with her including recurrence of severe pain, shortness of breath, or new symptoms. She has voiced understanding. We will follow up on repeat troponin and if negative, patient will be discharged with PCP follow-up later this week for consideration of stress testing.  Final Clinical Impressions(s) / ED Diagnoses   Final diagnoses:  None    New Prescriptions New Prescriptions   No  medications on file     Sharlett Iles, MD 05/26/16 [phone]

## 2016-05-28 ENCOUNTER — Encounter: Payer: Self-pay | Admitting: Internal Medicine

## 2016-05-28 ENCOUNTER — Ambulatory Visit (INDEPENDENT_AMBULATORY_CARE_PROVIDER_SITE_OTHER): Payer: Medicare Other | Admitting: Internal Medicine

## 2016-05-28 VITALS — BP 138/80 | HR 64 | Temp 98.2°F | Resp 16 | Ht 61.0 in | Wt 145.0 lb

## 2016-05-28 DIAGNOSIS — R079 Chest pain, unspecified: Secondary | ICD-10-CM | POA: Diagnosis not present

## 2016-05-28 DIAGNOSIS — K219 Gastro-esophageal reflux disease without esophagitis: Secondary | ICD-10-CM | POA: Diagnosis not present

## 2016-05-28 MED ORDER — RANITIDINE HCL 300 MG PO TABS: 300.0000 mg | ORAL_TABLET | Freq: Every day | ORAL | 1 refills | Status: DC

## 2016-05-28 NOTE — Patient Instructions (Signed)
You should get a call from [place] about getting in to see the [facility] in the next couple days.  She will get a time and a date that you can change if you need it.  You should take the ranitidine as needed when you are having severe reflux.  Please take the protonix daily for a week to two weeks as needed.  Please call the office if you have any new or concerning chest pains or palpitations.

## 2016-05-28 NOTE — Progress Notes (Signed)
Subjective:    Patient ID: Kimberly Wilcox, female    DOB: [birth year], 70 y.o.   MRN: [id number]  HPI  Patient reports to the office for evaluation for post ER chest pain evaluation.  She reports that she developed stabbing chest pain when ran through her left chest to her back.  She reports that she thought she might have a blood clot.  She reports that since she came back home she reports that she has been having some dull chest pains that are off and on in the chest.  She reports that the pain was responsive to NTG.  She reports that she had normal blood work, normal EKG, normal CXR, and also normal cardiac enzymes. She did have a normal heart catheterization in 2013 and that was normal.  She reports that she has not seen them since 2013.  She reports that the hospital recommended that she get a repeat stress test.   She reports that her acid reflux has been really bad lately.  She only takes protonix on an as needed basis. This chest pain was not similar to her episodes of reflux in the past.     Review of Systems  Constitutional: Negative for chills and fever.  HENT: Negative for congestion, ear pain and sore throat.   Eyes: Negative.   Respiratory: Negative for cough, shortness of breath and wheezing.   Cardiovascular: Positive for chest pain. Negative for palpitations and leg swelling.  Gastrointestinal: Positive for nausea and vomiting. Negative for abdominal pain, blood in stool, constipation and diarrhea.       GERD   Genitourinary: Negative.   Skin: Negative.   Neurological: Negative for dizziness and headaches.  Psychiatric/Behavioral: The patient is not nervous/anxious.        Objective:   Physical Exam  Constitutional: She is oriented to person, place, and time. She appears well-developed and well-nourished. No distress.  HENT:  Head: Normocephalic.  Mouth/Throat: Oropharynx is clear and moist. No oropharyngeal exudate.  Eyes: Conjunctivae are normal. No scleral  icterus.  Neck: Normal range of motion. Neck supple. No JVD present. No thyromegaly present.  Cardiovascular: Normal rate, regular rhythm, normal heart sounds and intact distal pulses.  Exam reveals no gallop and no friction rub.   No murmur heard. Pulmonary/Chest: Effort normal and breath sounds normal. No respiratory distress. She has no wheezes. She has no rales. She exhibits no tenderness.  Abdominal: Soft. Bowel sounds are normal. She exhibits no distension and no mass. There is no tenderness. There is no rebound and no guarding.  Musculoskeletal: Normal range of motion.  Lymphadenopathy:    She has no cervical adenopathy.  Neurological: She is alert and oriented to person, place, and time.  Skin: Skin is warm and dry. She is not diaphoretic.  Nursing note and vitals reviewed.   Vitals:   05/28/16 0910  BP: 138/80  Pulse: 64  Resp: 16  Temp: 98.2 F (36.8 C)        Assessment & Plan:    1. Chest pain, unspecified type -has seen Dr. Aundra Dubin in the past -lab work, ekg, and imaging in ER normal -normal 2013 cardiac catheterization -possible strain in chest wall  - Ambulatory referral to Cardiology  2. Gastroesophageal reflux disease, esophagitis presence not specified -try 2 weeks of protonix daily -then ranitidine prn for GERD symptoms   Will follow-up with patient after she has seen cards for stress test.  She is to go to ER for worsening chest pain.

## 2016-06-24 ENCOUNTER — Other Ambulatory Visit: Payer: Self-pay | Admitting: Internal Medicine

## 2016-07-02 DIAGNOSIS — H35371 Puckering of macula, right eye: Secondary | ICD-10-CM | POA: Diagnosis not present

## 2016-07-02 DIAGNOSIS — Z961 Presence of intraocular lens: Secondary | ICD-10-CM | POA: Diagnosis not present

## 2016-07-02 DIAGNOSIS — H2511 Age-related nuclear cataract, right eye: Secondary | ICD-10-CM | POA: Diagnosis not present

## 2016-07-02 DIAGNOSIS — H26492 Other secondary cataract, left eye: Secondary | ICD-10-CM | POA: Diagnosis not present

## 2016-07-19 NOTE — Progress Notes (Signed)
Cardiology Office Note   Date:  07/22/2016   ID:  Kimberly, Wilcox 1946/07/30, MRN [id number]  PCP:  Alesia Richards, MD  Cardiologist:   Jenkins Rouge, MD   Chief Complaint  Patient presents with  . Establish Care  . Chest Pain    2 months ago had chest pain left upper side into back, it has went away, no more CP, none today at ov, per pt      History of Present Illness: Kimberly Wilcox is a 70 y.o. female who presents for evaluation of chest pain Last seen by me in 2013. Chest pain cath done by Dr Aundra Dubin showed no CAD with normal LV function  Seen in ER 05/26/16 for chest pain.  History of HTN GERD renal cell cancer post left nephrectomy.  Gradual left sided SSCP walking at mall. Pain became sharp radiated to back and left upper arm.  Associated nausea Lasted total of 20 minutes Nitro helped Had upper endoscopy 02/03/16 by Dr Amedeo Plenty which was normal  She r/o CXR reviewed and NAD ECG with no acute changes   She had been in [place] seeing grand daughter prior to coming back to [place] Has not had recurrence since ER visit  Neuropathy symptoms in arms/legs Some retinal eye issues     Past Medical History:  Diagnosis Date  . Anxiety   . Chest discomfort   . GERD (gastroesophageal reflux disease)   . Hiatal hernia   . Hypertension    labile, normal cath 2013  . Osteoporosis   . Renal cell cancer ([place]) 1999   LEFT NEPHRECTOMY  . Vitamin D deficiency     Past Surgical History:  Procedure Laterality Date  . LEFT OOPHORECTOMY  1985   Benign tumor  . NEPHRECTOMY Left 1999   secondary to cancer  . TUBAL LIGATION       Current Outpatient Prescriptions  Medication Sig Dispense Refill  . aspirin 81 MG tablet Take 81 mg by mouth.     . B Complex-C (SUPER B COMPLEX PO) Take 1 tablet by mouth daily.     . Cholecalciferol (VITAMIN D) 2000 units tablet Take 1 tablet by mouth daily.    . diazepam (VALIUM) 2 MG tablet Take 1-2 mg by mouth every 6 (six) hours  as needed for anxiety.    Marland Kitchen latanoprost (XALATAN) 0.005 % ophthalmic solution 1 drop into both eyes once daily    . ondansetron (ZOFRAN) 8 MG tablet Take 8 mg by mouth every 8 (eight) hours as needed for nausea or vomiting.    . pantoprazole (PROTONIX) 40 MG tablet take 1 tablet by mouth once daily for GERD/HEART BURN 30 tablet 5  . ranitidine (ZANTAC) 300 MG tablet Take 1 tablet (300 mg total) by mouth at bedtime. 30 tablet 1  . nitroGLYCERIN (NITROSTAT) 0.4 MG SL tablet Place 1 tablet (0.4 mg total) under the tongue every 5 (five) minutes as needed for chest pain. 25 tablet 4   No current facility-administered medications for this visit.     Allergies:   Meloxicam; Nitrofurantoin monohyd macro; and Sulfa antibiotics    Social History:  The patient  reports that she quit smoking about 31 years ago. She has never used smokeless tobacco. She reports that she drinks alcohol. She reports that she does not use drugs.   Family History:  The patient's family history includes Pneumonia in her father.    ROS:  Please see the history of present illness.  Otherwise, review of systems are positive for none.   All other systems are reviewed and negative.    PHYSICAL EXAM: VS:  BP 140/84   Pulse 71   Ht 5\' 1"  (1.549 m)   Wt 65.8 kg (145 lb)   SpO2 98%   BMI 27.40 kg/m  , BMI Body mass index is 27.4 kg/m. Affect appropriate Healthy:  appears stated age 29: normal Neck supple with no adenopathy JVP normal no bruits no thyromegaly Lungs clear with no wheezing and good diaphragmatic motion Heart:  S1/S2 no murmur, no rub, gallop or click PMI normal Abdomen: benighn, BS positve, no tenderness, post nephrectomy  no bruit.  No HSM or HJR Distal pulses intact with no bruits No edema Neuro non-focal Skin warm and dry No muscular weakness    EKG:  05/27/16 SR rate 80 normal    Recent Labs: 03/13/2016: ALT 5; Magnesium 1.8; TSH 3.00 05/26/2016: BUN 13; Creatinine, Ser 0.83; Hemoglobin  12.7; Platelets 251; Potassium 4.1; Sodium 138    Lipid Panel    Component Value Date/Time   CHOL 209 (H) 03/13/2016 1045   TRIG 88 03/13/2016 1045   HDL 91 03/13/2016 1045   CHOLHDL 2.3 03/13/2016 1045   VLDL 18 03/13/2016 1045   LDLCALC 100 03/13/2016 1045      Wt Readings from Last 3 Encounters:  07/22/16 65.8 kg (145 lb)  05/28/16 65.8 kg (145 lb)  05/26/16 64.9 kg (143 lb)      Other studies Reviewed: Additional studies/ records that were reviewed today include: Cath 2013 notes Dr Aundra Dubin and ER Labs CXR and ECG .    ASSESSMENT AND PLAN:  1.  Chest Pain atypical f/u ETT normal cath 2013  2. HTN: Well controlled.  Continue current medications and low sodium Dash type diet.   3. GERD: low carb diet H2 blocker EGD ok  4. Renal Cancer post nephrectomy f/u primary Cr ok  5. Anxiety improved    Current medicines are reviewed at length with the patient today.  The patient does not have concerns regarding medicines.  The following changes have been made:  no change  Labs/ tests ordered today include: ETT No orders of the defined types were placed in this encounter.    Disposition:   FU with PRN      Signed, Jenkins Rouge, MD  07/22/2016 10:22 AM    [facility] [place], [place], [place]  [address] Phone: [phone]; Fax: [phone]

## 2016-07-22 ENCOUNTER — Ambulatory Visit (INDEPENDENT_AMBULATORY_CARE_PROVIDER_SITE_OTHER): Payer: Medicare Other | Admitting: Cardiovascular Disease

## 2016-07-22 ENCOUNTER — Encounter: Payer: Self-pay | Admitting: Cardiovascular Disease

## 2016-07-22 VITALS — BP 140/84 | HR 71 | Ht 61.0 in | Wt 145.0 lb

## 2016-07-22 DIAGNOSIS — Z7689 Persons encountering health services in other specified circumstances: Secondary | ICD-10-CM | POA: Diagnosis not present

## 2016-07-22 DIAGNOSIS — R079 Chest pain, unspecified: Secondary | ICD-10-CM

## 2016-07-22 NOTE — Patient Instructions (Addendum)
Medication Instructions:  Your physician recommends that you continue on your current medications as directed. Please refer to the Current Medication list given to you today.  Labwork: NONE  Testing/Procedures: Your physician has requested that you have an exercise tolerance test. For further information please visit [email or web address]. Please also follow instruction sheet, as given.  Follow-Up: Your physician wants you to follow-up as needed with Dr. Nishan.    If you need a refill on your cardiac medications before your next appointment, please call your pharmacy.    

## 2016-07-27 ENCOUNTER — Telehealth: Payer: Self-pay | Admitting: Cardiovascular Disease

## 2016-07-27 NOTE — Telephone Encounter (Signed)
Will forward to Dr. Johnsie Cancel so he is aware that patient cancelled appointment for GXT . Patient is not interested in rescheduling.

## 2016-07-27 NOTE — Telephone Encounter (Signed)
°  Wilcox, Kimberly D    Cancel Rsn: Patient Public relations account executive Per patient [name] ,. Did not want to r/s treadmill )

## 2016-07-28 ENCOUNTER — Ambulatory Visit (INDEPENDENT_AMBULATORY_CARE_PROVIDER_SITE_OTHER): Payer: Medicare Other | Admitting: Physician Assistant

## 2016-07-28 ENCOUNTER — Encounter: Payer: Self-pay | Admitting: Physician Assistant

## 2016-07-28 VITALS — BP 130/64 | HR 84 | Temp 97.7°F | Resp 16 | Ht 61.0 in | Wt 148.8 lb

## 2016-07-28 DIAGNOSIS — Z23 Encounter for immunization: Secondary | ICD-10-CM | POA: Diagnosis not present

## 2016-07-28 DIAGNOSIS — I10 Essential (primary) hypertension: Secondary | ICD-10-CM

## 2016-07-28 DIAGNOSIS — M5442 Lumbago with sciatica, left side: Secondary | ICD-10-CM | POA: Diagnosis not present

## 2016-07-28 MED ORDER — PREGABALIN 50 MG PO CAPS: 50.0000 mg | ORAL_CAPSULE | Freq: Three times a day (TID) | ORAL | 0 refills | Status: DC

## 2016-07-28 MED ORDER — PREDNISONE 20 MG PO TABS: ORAL_TABLET | ORAL | 0 refills | Status: DC

## 2016-07-28 NOTE — Progress Notes (Signed)
Subjective:    Patient ID: Kimberly Wilcox, female    DOB: 1946/01/04, 70 y.o.   MRN: [id number]  HPI 70 y.o. WF presents with left leg pain intermittent x 1 month. She is having mild cramping with walking posterior calf of left leg, with pain at lateral and medial knee, pain worse with walking, better after rest. She has some pain left lateral hip.  She has been trying to walk more. Normal lumar xray 2014. Some numbness tingling, bilateral legs, intermittent, no weakness, no fever, chills.   Blood pressure 130/64, pulse 84, temperature 97.7 F (36.5 C), resp. rate 16, height 5\' 1"  (1.549 m), weight 148 lb 12.8 oz (67.5 kg), SpO2 97 %.  Medications Current Outpatient Prescriptions on File Prior to Visit  Medication Sig  . aspirin 81 MG tablet Take 81 mg by mouth.   . B Complex-C (SUPER B COMPLEX PO) Take 1 tablet by mouth daily.   . Cholecalciferol (VITAMIN D) 2000 units tablet Take 1 tablet by mouth daily.  . diazepam (VALIUM) 2 MG tablet Take 1-2 mg by mouth every 6 (six) hours as needed for anxiety.  Marland Kitchen latanoprost (XALATAN) 0.005 % ophthalmic solution 1 drop into both eyes once daily  . ondansetron (ZOFRAN) 8 MG tablet Take 8 mg by mouth every 8 (eight) hours as needed for nausea or vomiting.  . pantoprazole (PROTONIX) 40 MG tablet take 1 tablet by mouth once daily for GERD/HEART BURN  . ranitidine (ZANTAC) 300 MG tablet Take 1 tablet (300 mg total) by mouth at bedtime.  . nitroGLYCERIN (NITROSTAT) 0.4 MG SL tablet Place 1 tablet (0.4 mg total) under the tongue every 5 (five) minutes as needed for chest pain.   No current facility-administered medications on file prior to visit.     Problem list She has GERD (gastroesophageal reflux disease); Anxiety; Hiatal hernia; Vitamin D deficiency; Hyperlipidemia; Prediabetes; History of renal cell cancer; Labile hypertension; Osteoporosis; Neuropathy ([place]); and Memory changes on her problem list.  Review of Systems  Constitutional:  Negative for chills, fatigue and fever.  Gastrointestinal: Negative for nausea and vomiting.  Genitourinary: Negative for difficulty urinating, dysuria, flank pain, frequency, hematuria and urgency.  Musculoskeletal: Positive for back pain.  Neurological: Positive for numbness (left leg). Negative for dizziness and weakness.       Objective:   Physical Exam  Constitutional: She is oriented to person, place, and time. She appears well-developed and well-nourished.  HENT:  Head: Normocephalic and atraumatic.  Eyes: Conjunctivae are normal. Pupils are equal, round, and reactive to light.  Neck: Normal range of motion. Neck supple.  Cardiovascular: Normal rate and regular rhythm.   Pulmonary/Chest: Effort normal and breath sounds normal.  Abdominal: Soft. Bowel sounds are normal. There is no tenderness.  Musculoskeletal: She exhibits tenderness. She exhibits no edema.  Patient is able to ambulate well. Gait is not  Antalgic. Straight leg raising with dorsiflexion negative bilaterally for radicular symptoms. Sensory exam in the legs are abnormal  With decreased sensation left lateral leg. Knee reflexes are normal Ankle reflexes are normal Strength is normal and symmetric in arms and legs. There is left SI tenderness to palpation.  There is not paraspinal muscle spasm.  There is not midline tenderness. Full ROM back and left hip with pain. She has good palpable pulses bilateral legs.   Lymphadenopathy:    She has no cervical adenopathy.  Neurological: She is alert and oriented to person, place, and time. She has normal reflexes.  Skin: Skin  is warm and dry. No rash noted.  No warmth, tenderness, hard cord      Assessment & Plan:  Left leg pain No swelling, warmth, tenderness Pain along L4,L5, S1 distribution, no weakness, no incontinence, no saddle anesthesia Versus pseudoclaudication/spinal stenosis Will give lyrica, get MRI, and refer to ortho for possible injections,

## 2016-07-28 NOTE — Patient Instructions (Signed)
Sciatica Rehab Ask your health care provider which exercises are safe for you. Do exercises exactly as told by your health care provider and adjust them as directed. It is normal to feel mild stretching, pulling, tightness, or discomfort as you do these exercises, but you should stop right away if you feel sudden pain or your pain gets worse.Do not begin these exercises until told by your health care provider. Stretching and range of motion exercises These exercises warm up your muscles and joints and improve the movement and flexibility of your hips and your back. These exercises also help to relieve pain, numbness, and tingling. Exercise A: Sciatic nerve glide 1. Sit in a chair with your head facing down toward your chest. Place your hands behind your back. Let your shoulders slump forward. 2. Slowly straighten one of your knees while you tilt your head back as if you are looking toward the ceiling. Only straighten your leg as far as you can without making your symptoms worse. 3. Hold for __________ seconds. 4. Slowly return to the starting position. 5. Repeat with your other leg. Repeat __________ times. Complete this exercise __________ times a day. Exercise B: Knee to chest with hip adduction and internal rotation 1. Lie on your back on a firm surface with both legs straight. 2. Bend one of your knees and move it up toward your chest until you feel a gentle stretch in your lower back and buttock. Then, move your knee toward the shoulder that is on the opposite side from your leg.  Hold your leg in this position by holding onto the front of your knee. 3. Hold for __________ seconds. 4. Slowly return to the starting position. 5. Repeat with your other leg. Repeat __________ times. Complete this exercise __________ times a day. Exercise C: Prone extension on elbows 1. Lie on your abdomen on a firm surface. A bed may be too soft for this exercise. 2. Prop yourself up on your elbows. 3. Use  your arms to help lift your chest up until you feel a gentle stretch in your abdomen and your lower back.  This will place some of your body weight on your elbows. If this is uncomfortable, try stacking pillows under your chest.  Your hips should stay down, against the surface that you are lying on. Keep your hip and back muscles relaxed. 4. Hold for __________ seconds. 5. Slowly relax your upper body and return to the starting position. Repeat __________ times. Complete this exercise __________ times a day. Strengthening exercises These exercises build strength and endurance in your back. Endurance is the ability to use your muscles for a long time, even after they get tired. Exercise D: Pelvic tilt 1. Lie on your back on a firm surface. Bend your knees and keep your feet flat. 2. Tense your abdominal muscles. Tip your pelvis up toward the ceiling and flatten your lower back into the floor.  To help with this exercise, you may place a small towel under your lower back and try to push your back into the towel. 3. Hold for __________ seconds. 4. Let your muscles relax completely before you repeat this exercise. Repeat __________ times. Complete this exercise __________ times a day. Exercise E: Alternating arm and leg raises 1. Get on your hands and knees on a firm surface. If you are on a hard floor, you may want to use padding to cushion your knees, such as an exercise mat. 2. Line up your arms and legs. Your  hands should be below your shoulders, and your knees should be below your hips. 3. Lift your left leg behind you. At the same time, raise your right arm and straighten it in front of you.  Do not lift your leg higher than your hip.  Do not lift your arm higher than your shoulder.  Keep your abdominal and back muscles tight.  Keep your hips facing the ground.  Do not arch your back.  Keep your balance carefully, and do not hold your breath. 4. Hold for __________  seconds. 5. Slowly return to the starting position and repeat with your right leg and your left arm. Repeat __________ times. Complete this exercise __________ times a day. Posture and body mechanics   Body mechanics refers to the movements and positions of your body while you do your daily activities. Posture is part of body mechanics. Good posture and healthy body mechanics can help to relieve stress in your body's tissues and joints. Good posture means that your spine is in its natural S-curve position (your spine is neutral), your shoulders are pulled back slightly, and your head is not tipped forward. The following are general guidelines for applying improved posture and body mechanics to your everyday activities. Standing   When standing, keep your spine neutral and your feet about hip-width apart. Keep a slight bend in your knees. Your ears, shoulders, and hips should line up.  When you do a task in which you stand in one place for a long time, place one foot up on a stable object that is 2-4 inches (5-10 cm) high, such as a footstool. This helps keep your spine neutral. Sitting  When sitting, keep your spine neutral and keep your feet flat on the floor. Use a footrest, if necessary, and keep your thighs parallel to the floor. Avoid rounding your shoulders, and avoid tilting your head forward.  When working at a desk or a computer, keep your desk at a height where your hands are slightly lower than your elbows. Slide your chair under your desk so you are close enough to maintain good posture.  When working at a computer, place your monitor at a height where you are looking straight ahead and you do not have to tilt your head forward or downward to look at the screen. Resting   When lying down and resting, avoid positions that are most painful for you.  If you have pain with activities such as sitting, bending, stooping, or squatting (flexion-based activities), lie in a position in  which your body does not bend very much. For example, avoid curling up on your side with your arms and knees near your chest (fetal position).  If you have pain with activities such as standing for a long time or reaching with your arms (extension-based activities), lie with your spine in a neutral position and bend your knees slightly. Try the following positions:  Lying on your side with a pillow between your knees.  Lying on your back with a pillow under your knees. Lifting   When lifting objects, keep your feet at least shoulder-width apart and tighten your abdominal muscles.  Bend your knees and hips and keep your spine neutral. It is important to lift using the strength of your legs, not your back. Do not lock your knees straight out.  Always ask for help to lift heavy or awkward objects. This information is not intended to replace advice given to you by your health care provider. Make sure  or awkward objects. This information is not intended to replace advice given to you by your health care provider. Make sure you discuss any questions you have with your health care provider. Document Released: 08/10/2005 Document Revised: 04/16/2016 Document Reviewed: 04/26/2015 Elsevier Interactive Patient Education  2017 [employer]vier Inc.          

## 2016-08-27 ENCOUNTER — Ambulatory Visit (INDEPENDENT_AMBULATORY_CARE_PROVIDER_SITE_OTHER): Payer: Medicare Other | Admitting: Orthopaedic Surgery

## 2016-08-27 ENCOUNTER — Ambulatory Visit (INDEPENDENT_AMBULATORY_CARE_PROVIDER_SITE_OTHER): Payer: BC Managed Care – PPO

## 2016-08-27 ENCOUNTER — Encounter (INDEPENDENT_AMBULATORY_CARE_PROVIDER_SITE_OTHER): Payer: Self-pay

## 2016-08-27 DIAGNOSIS — M5442 Lumbago with sciatica, left side: Secondary | ICD-10-CM | POA: Diagnosis not present

## 2016-08-27 DIAGNOSIS — G8929 Other chronic pain: Secondary | ICD-10-CM

## 2016-08-27 NOTE — Progress Notes (Signed)
Office Visit Note   Patient: Kimberly Wilcox           Date of Birth: [birth year]           MRN: [id number] Visit Date: 08/27/2016              Requested by: Vicie Mutters, PA-C [address] [place], [place] [address] PCP: Alesia Richards, MD   Assessment & Plan: Visit Diagnoses:  1. Chronic left-sided low back pain with left-sided sciatica     Plan: Right now her sciatica is resolved. Her back architecture looks good on plain films. I would recommend daily back extension exercises that will help. If the sciatica comes back my next step would be obtain an MRI of her lumbar spine. She'll otherwise follow up as needed.  Follow-Up Instructions: Return if symptoms worsen or fail to improve.   Orders:  Orders Placed This Encounter  Procedures  . XR Lumbar Spine 2-3 Views   No orders of the defined types were placed in this encounter.     Procedures: No procedures performed   Clinical Data: No additional findings.   Subjective: No chief complaint on file. She is sent to me from her primary care physician to evaluate left-sided sciatica. She's been having positive and worsening recently for over a year now but after steroid taper for her primary care physician her symptoms at almost resolved. She actually feels pretty good today. She did we'll get x-rays to make sure that her back alignment is okay. She describes pain before the start her backside of left side rating down behind her knee and into the top of her foot. She was also having some cramping in her calf.  HPI  Review of Systems Currently denies any numb sending in her legs. She denies any chest pain, shortness of breath, fever, chills, nausea, vomiting. She denies any bowel or bladder function changes.  Objective: Vital Signs: There were no vitals taken for this visit.  Physical Exam He is alert and oriented 3 in no acute distress Ortho Exam She has excellent range of motion of her  lumbar spine. She has negative straight leg raise bilaterally she has 5 out of 5 strength in her bilateral lower extremities and normal sensation. She has negative straight leg raise. She has normal reflexes bilaterally. She has excellent flexion-extension of her lumbar spine. Specialty Comments:  No specialty comments available.  Imaging: Xr Lumbar Spine 2-3 Views  Result Date: 08/27/2016 An AP and lateral of her lumbar spine shows well maintained disc spaces at excellent alignment overall. There is no significant stenosis that I can see. No evidence of compression deformity or other acute injury.    PMFS History: Patient Active Problem List   Diagnosis Date Noted  . Neuropathy ([place]) 01/09/2015  . Memory changes 01/09/2015  . Hyperlipidemia 10/29/2014  . Prediabetes 10/29/2014  . History of renal cell cancer 10/29/2014  . Labile hypertension 10/29/2014  . Osteoporosis 10/29/2014  . Vitamin D deficiency   . GERD (gastroesophageal reflux disease)   . Anxiety   . Hiatal hernia    Past Medical History:  Diagnosis Date  . Anxiety   . Chest discomfort   . GERD (gastroesophageal reflux disease)   . Hiatal hernia   . Hypertension    labile, normal cath 2013  . Osteoporosis   . Renal cell cancer ([place]) 1999   LEFT NEPHRECTOMY  . Vitamin D deficiency     Family History  Problem Relation Age  of Onset  . Pneumonia Father     Past Surgical History:  Procedure Laterality Date  . LEFT OOPHORECTOMY  1985   Benign tumor  . NEPHRECTOMY Left 1999   secondary to cancer  . TUBAL LIGATION     Social History   Occupational History  . [employer]    Social History Main Topics  . Smoking status: Former Smoker    Quit date: 10/28/1984  . Smokeless tobacco: Never Used  . Alcohol use 0.0 oz/week     Comment: wine 2-3 glasses once a month  . Drug use: No  . Sexual activity: Not on file

## 2016-09-24 ENCOUNTER — Encounter: Payer: Self-pay | Admitting: Internal Medicine

## 2016-09-24 ENCOUNTER — Ambulatory Visit (INDEPENDENT_AMBULATORY_CARE_PROVIDER_SITE_OTHER): Payer: Medicare Other | Admitting: Internal Medicine

## 2016-09-24 VITALS — BP 122/66 | HR 74 | Temp 98.2°F | Resp 16 | Ht 61.0 in | Wt 148.0 lb

## 2016-09-24 DIAGNOSIS — J209 Acute bronchitis, unspecified: Secondary | ICD-10-CM

## 2016-09-24 MED ORDER — IPRATROPIUM-ALBUTEROL 0.5-2.5 (3) MG/3ML IN SOLN
3.0000 mL | Freq: Once | RESPIRATORY_TRACT | Status: AC
  Administered 2016-09-24: 3 mL via RESPIRATORY_TRACT

## 2016-09-24 MED ORDER — PREDNISONE 20 MG PO TABS: ORAL_TABLET | ORAL | 0 refills | Status: DC

## 2016-09-24 MED ORDER — FLUTICASONE FUROATE-VILANTEROL 100-25 MCG/INH IN AEPB: 1.0000 | INHALATION_SPRAY | Freq: Every day | RESPIRATORY_TRACT | 0 refills | Status: AC

## 2016-09-24 MED ORDER — FLUTICASONE PROPIONATE 50 MCG/ACT NA SUSP: 2.0000 | Freq: Every day | NASAL | 0 refills | Status: DC

## 2016-09-24 MED ORDER — AZITHROMYCIN 250 MG PO TABS: ORAL_TABLET | ORAL | 0 refills | Status: DC

## 2016-09-24 MED ORDER — PROMETHAZINE-DM 6.25-15 MG/5ML PO SYRP: ORAL_SOLUTION | ORAL | 1 refills | Status: DC

## 2016-09-24 NOTE — Patient Instructions (Addendum)
Please take a daily anthistamine like claritin, zyrtec, or allegra daily.  Please use flonase 2 sprays per each nostril righbt before bedtime.  Please take zpak until it is gone.  Please take prednisone with your breakfast as it is prescribed until it is gone.  Please use breo inhaler once daily.  Please make sure you rinse your mouth out after you use it.  Please use the cough syrup up to 3 times daily as you need it for cough.  Please call the office if you have no improvement.  This will likely take several weeks to clear.

## 2016-09-24 NOTE — Progress Notes (Signed)
HPI  Patient presents to the office for evaluation of cough.  It has been going on for 5 days.  Patient reports night > day, wet, barky, worse with lying down, green sputum production in the mornings which lightens during the day.  They also endorse change in voice, postnasal drip, shortness of breath, wheezing and nasal congestion, mild blood in streaks in the clear nasal discharge, headaches.  .  They have tried nyquil tablets and robitussin.  They report that nothing has worked.  They denies other sick contacts.  She has been trying to separate herself from family.     Review of Systems  Constitutional: Positive for malaise/fatigue. Negative for chills and fever.  HENT: Positive for congestion, ear pain, hearing loss and sore throat.   Respiratory: Positive for cough. Negative for sputum production, shortness of breath and wheezing.   Cardiovascular: Negative for chest pain, palpitations and leg swelling.  Neurological: Positive for headaches.    PE:  Vitals:   09/24/16 0943  BP: 122/66  Pulse: 74  Resp: 16  Temp: 98.2 F (36.8 C)    General:  Alert and non-toxic, WDWN, NAD HEENT: NCAT, PERLA, EOM normal, no occular discharge or erythema.  Nasal mucosal edema with sinus tenderness to palpation.  Oropharynx clear with minimal oropharyngeal edema and erythema.  Mucous membranes moist and pink. Neck:  Cervical adenopathy Chest:  RRR no MRGs.  Lungs clear to auscultation A&P with no rhonchi or rales.  Bilateral end expiratory wheezes present.   Abdomen: +BS x 4 quadrants, soft, non-tender, no guarding, rigidity, or rebound. Skin: warm and dry no rash Neuro: A&Ox4, CN II-XII grossly intact  Assessment and Plan:   1. Acute bronchitis, unspecified organism -breo sample given for 14 days -if worsening shortness of breath she needs to return to office for further eval -if overnight or weekend patient instructed to go to the ER - azithromycin (ZITHROMAX Z-PAK) 250 MG tablet; 2 po day  one, then 1 daily x 4 days  Dispense: 6 tablet; Refill: 0 - predniSONE (DELTASONE) 20 MG tablet; 3 tabs po daily x 3 days, then 2 tabs x 3 days, then 1.5 tabs x 3 days, then 1 tab x 3 days, then 0.5 tabs x 3 days  Dispense: 27 tablet; Refill: 0 - fluticasone furoate-vilanterol (BREO ELLIPTA) 100-25 MCG/INH AEPB; Inhale 1 puff into the lungs daily.  Dispense: 1 each; Refill: 0 - fluticasone (FLONASE) 50 MCG/ACT nasal spray; Place 2 sprays into both nostrils daily.  Dispense: 16 g; Refill: 0 - promethazine-dextromethorphan (PROMETHAZINE-DM) 6.25-15 MG/5ML syrup; Take 5-10 ML PO q8hrs prn for coughing  Dispense: 360 mL; Refill: 1 - ipratropium-albuterol (DUONEB) 0.5-2.5 (3) MG/3ML nebulizer solution 3 mL; Take 3 mLs by nebulization once.

## 2016-10-05 ENCOUNTER — Other Ambulatory Visit: Payer: Self-pay | Admitting: Physician Assistant

## 2016-10-05 ENCOUNTER — Other Ambulatory Visit: Payer: Self-pay | Admitting: Internal Medicine

## 2016-10-05 NOTE — Telephone Encounter (Signed)
Please call aplrazolam

## 2016-11-24 ENCOUNTER — Encounter: Payer: Self-pay | Admitting: Internal Medicine

## 2016-11-24 ENCOUNTER — Ambulatory Visit (INDEPENDENT_AMBULATORY_CARE_PROVIDER_SITE_OTHER): Payer: Medicare Other | Admitting: Internal Medicine

## 2016-11-24 VITALS — BP 116/64 | HR 74 | Temp 98.2°F | Resp 14 | Ht 61.0 in | Wt 142.0 lb

## 2016-11-24 DIAGNOSIS — R300 Dysuria: Secondary | ICD-10-CM | POA: Diagnosis not present

## 2016-11-24 LAB — URINALYSIS, ROUTINE W REFLEX MICROSCOPIC
Bilirubin Urine: NEGATIVE
Glucose, UA: NEGATIVE
Hgb urine dipstick: NEGATIVE
Leukocytes, UA: NEGATIVE
Nitrite: NEGATIVE
Protein, ur: NEGATIVE
Specific Gravity, Urine: 1.005 (ref 1.001–1.035)
pH: 6 (ref 5.0–8.0)

## 2016-11-24 MED ORDER — CIPROFLOXACIN HCL 500 MG PO TABS: 500.0000 mg | ORAL_TABLET | Freq: Two times a day (BID) | ORAL | 0 refills | Status: AC

## 2016-11-24 NOTE — Progress Notes (Signed)
Assessment and Plan:   1. Dysuria -cipro -if positive culture will need recheck in 1 month -no evidence of kidney infection, if flank pain, inability to take meds, or any other new and concerning symptoms will need to go to the ER.   - Urinalysis, Routine w reflex microscopic - Culture, Urine     HPI 71 y.o.female presents for 3 weeks of foul smelling urine, dark color, and suprapubic tenderness.  She reports that she is having cramping type sensations.  She reports that she is having a lot of frequency and urgency.  She has been drinking cranberry juice and water.  She has not had any blood in urine.  She has not had dysuria.  She reports that she has not had vaginal discharge.    Past Medical History:  Diagnosis Date  . Anxiety   . Chest discomfort   . GERD (gastroesophageal reflux disease)   . Hiatal hernia   . Hypertension    labile, normal cath 2013  . Osteoporosis   . Renal cell cancer ([place]) 1999   LEFT NEPHRECTOMY  . Vitamin D deficiency      Allergies  Allergen Reactions  . Meloxicam Itching  . Nitrofurantoin Monohyd Macro     "flu like" symptoms   . Sulfa Antibiotics Hives      Current Outpatient Prescriptions on File Prior to Visit  Medication Sig Dispense Refill  . aspirin 81 MG tablet Take 81 mg by mouth.     . B Complex-C (SUPER B COMPLEX PO) Take 1 tablet by mouth daily.     . Cholecalciferol (VITAMIN D) 2000 units tablet Take 1 tablet by mouth daily.    . diazepam (VALIUM) 2 MG tablet take 1/2 to 1 tablet by mouth UP TO three times a day 90 tablet 0  . fluticasone (FLONASE) 50 MCG/ACT nasal spray Place 2 sprays into both nostrils daily. 16 g 0  . latanoprost (XALATAN) 0.005 % ophthalmic solution 1 drop into both eyes once daily    . pantoprazole (PROTONIX) 40 MG tablet take 1 tablet by mouth once daily FOR GERD OR HEART BURN 30 tablet 5  . pregabalin (LYRICA) 50 MG capsule Take 1 capsule (50 mg total) by mouth 3 (three) times daily. 30 capsule 0  .  nitroGLYCERIN (NITROSTAT) 0.4 MG SL tablet Place 1 tablet (0.4 mg total) under the tongue every 5 (five) minutes as needed for chest pain. 25 tablet 4   No current facility-administered medications on file prior to visit.     ROS: all negative except above.   Physical Exam: Filed Weights   11/24/16 0928  Weight: 142 lb (64.4 kg)   BP 116/64   Pulse 74   Temp 98.2 F (36.8 C) (Temporal)   Resp 14   Ht 5\' 1"  (1.549 m)   Wt 142 lb (64.4 kg)   BMI 26.83 kg/m  General Appearance: Well developed well nourished, non-toxic appearing in no apparent distress. Eyes: PERRLA, EOMs, conjunctiva w/ no swelling or erythema or discharge Sinuses: No Frontal/maxillary tenderness ENT/Mouth: Ear canals clear without swelling or erythema.  TM's normal bilaterally with no retractions, bulging, or loss of landmarks.   Neck: Supple, thyroid normal, no notable JVD  Respiratory: Respiratory effort normal, Clear breath sounds anteriorly and posteriorly bilaterally without rales, rhonchi, wheezing or stridor. No retractions or accessory muscle usage. Cardio: RRR with no MRGs.   Abdomen: Soft, + BS.  Non tender, no guarding, rebound, hernias, masses.  Musculoskeletal: Full ROM, 5/5 strength,  normal gait.  Skin: Warm, dry without rashes  Neuro: Awake and oriented X 3, Cranial nerves intact. Normal muscle tone, no cerebellar symptoms. Sensation intact.  Psych: normal affect, Insight and Judgment appropriate.     Starlyn Skeans, PA-C 9:36 AM [facility]

## 2016-11-26 LAB — URINE CULTURE: Organism ID, Bacteria: NO GROWTH

## 2016-11-27 ENCOUNTER — Other Ambulatory Visit: Payer: Self-pay | Admitting: Internal Medicine

## 2016-11-27 MED ORDER — HYOSCYAMINE SULFATE SL 0.125 MG SL SUBL: 1.0000 | SUBLINGUAL_TABLET | Freq: Four times a day (QID) | SUBLINGUAL | 0 refills | Status: DC

## 2017-02-16 ENCOUNTER — Other Ambulatory Visit: Payer: Self-pay | Admitting: Internal Medicine

## 2017-03-01 ENCOUNTER — Other Ambulatory Visit: Payer: Self-pay

## 2017-03-01 ENCOUNTER — Ambulatory Visit (INDEPENDENT_AMBULATORY_CARE_PROVIDER_SITE_OTHER): Payer: Medicare Other

## 2017-03-01 ENCOUNTER — Other Ambulatory Visit: Payer: Self-pay | Admitting: Physician Assistant

## 2017-03-01 ENCOUNTER — Telehealth: Payer: Self-pay

## 2017-03-01 DIAGNOSIS — R8271 Bacteriuria: Secondary | ICD-10-CM | POA: Diagnosis not present

## 2017-03-01 DIAGNOSIS — R350 Frequency of micturition: Secondary | ICD-10-CM

## 2017-03-01 DIAGNOSIS — N390 Urinary tract infection, site not specified: Secondary | ICD-10-CM | POA: Diagnosis not present

## 2017-03-01 NOTE — Progress Notes (Signed)
Pt presents for poss UTI, pt reports pain in lower ABD area x 3wks.  Pt did bring in urine to be tested.   Pt states she does not have any burning at this time. She states she should have come in sooner but was trying to treat sxs at home. States she is in a lot of pain-pain is at a 6 per pt.

## 2017-03-01 NOTE — Telephone Encounter (Signed)
Pt called to report poss. UTI, frequency, burning, urine dark,abd pain, & nausea. Pt would like to come in for a nurse visit for UA & culture.   Per provider this type of appt would be ok.   Pt states she will come in today for NV.

## 2017-03-02 LAB — URINALYSIS, ROUTINE W REFLEX MICROSCOPIC
Bilirubin Urine: NEGATIVE
Glucose, UA: NEGATIVE
Hgb urine dipstick: NEGATIVE
Ketones, ur: NEGATIVE
Leukocytes, UA: NEGATIVE
Nitrite: NEGATIVE
Protein, ur: NEGATIVE
Specific Gravity, Urine: 1.018 (ref 1.001–1.035)
pH: 8 (ref 5.0–8.0)

## 2017-03-03 LAB — URINE CULTURE: Organism ID, Bacteria: NO GROWTH

## 2017-03-03 LAB — MICROALBUMIN / CREATININE URINE RATIO
Creatinine, Urine: 126 mg/dL (ref 20–320)
Microalb Creat Ratio: 13 mcg/mg creat (ref <30)
Microalb, Ur: 1.6 mg/dL

## 2017-03-03 NOTE — Progress Notes (Signed)
Pt aware of lab results & voiced understanding of those results.

## 2017-03-16 NOTE — Progress Notes (Signed)
MEDICARE ANNUAL WELLNESS VISIT AND CPE  Assessment:   Labile hypertension - continue medications, DASH diet, exercise and monitor at home. Call if greater than 130/80.  -     CBC with Differential/Platelet -     BASIC METABOLIC PANEL WITH GFR -     Hepatic function panel -     TSH  Neuropathy  Hyperlipidemia, unspecified hyperlipidemia type -continue medications, check lipids, decrease fatty foods, increase activity.  -     Lipid panel  Prediabetes Discussed general issues about diabetes pathophysiology and management., Educational material distributed., Suggested low cholesterol diet., Encouraged aerobic exercise., Discussed foot care., Reminded to get yearly retinal exam. -     Hemoglobin A1c  History of renal cell cancer monitor  Osteoporosis, unspecified osteoporosis type, unspecified pathological fracture presence continue Vit D and Ca, weight bearing exercises  Hiatal hernia Continue PPI/H2 blocker, diet discussed  Gastroesophageal reflux disease with esophagitis Continue PPI/H2 blocker, diet discussed  Anxiety continue medications, stress management techniques discussed, increase water, good sleep hygiene discussed, increase exercise, and increase veggies.   Vitamin D deficiency  Memory changes Follows Neuro  Medication management -     Magnesium  Encounter for Medicare annual wellness exam  Left lower abdominal pain Had normal AB [place] 2015 for similar pain, has some dysuria with it + cystocele on exam Will send to GYN, check labs, if not better will get CT Any worsening symptoms go to ER -     CBC with Differential/Platelet -     BASIC METABOLIC PANEL WITH GFR -     Hepatic function panel -     Ambulatory referral to Gynecology  Dysuria -     Urinalysis, Routine w reflex microscopic -     Urine Culture -     Ambulatory referral to Gynecology  Female cystocele -     Ambulatory referral to Gynecology   Over 40 minutes of exam, counseling, chart  review and critical decision making was performed Future Appointments Date Time Provider [place]  03/17/2018 10:00 AM Vicie Mutters, PA-C [id number] None     Plan:   During the course of the visit the patient was educated and counseled about appropriate screening and preventive services including:    Pneumococcal vaccine   Prevnar 13  Influenza vaccine  Td vaccine  Screening electrocardiogram  Bone densitometry screening  Colorectal cancer screening  Diabetes screening  Glaucoma screening  Nutrition counseling   Advanced directives: requested   Subjective:  Kimberly Wilcox is a 71 y.o. female who presents for Medicare Annual Wellness Visit and complete physical.    She has long history of left lower quadrant pain, can be worse with urination at time, has had increase in # of BM's or feels that she can not get out the stool all the way, does feel pressure in lower AB, feels something falling out, has seen GI recently with normal exam. No fever, chills, some nausea, no vomiting, no dark/black stools.   Her blood pressure has been controlled at home, today their BP is BP: 118/76  She does workout, walking and states that her neuropathy has improved. She denies chest pain, shortness of breath, dizziness.  She is not on cholesterol medication and denies myalgias. Her cholesterol is at goal. The cholesterol last visit was:   Lab Results  Component Value Date   CHOL 209 (H) 03/13/2016   HDL 91 03/13/2016   LDLCALC 100 03/13/2016   TRIG 88 03/13/2016   CHOLHDL 2.3 03/13/2016  She has history of anxiety, can not tolerate SSRI, will take valium 1/2 pill as needed for anxiety, 1-2 days a week. Is now retired.   She has been working on diet and exercise for prediabetes, and denies paresthesia of the feet, polydipsia, polyuria and visual disturbances. Last A1C in the office was:  Lab Results  Component Value Date   HGBA1C 5.6 03/13/2016   Lab Results   Component Value Date   GFRNONAA >60 05/26/2016  Patient is on Vitamin D supplement.   Lab Results  Component Value Date   VD25OH 52 10/29/2014  BMI is Body mass index is 27.36 kg/m., she is working on diet and exercise. Wt Readings from Last 3 Encounters:  03/17/17 144 lb 12.8 oz (65.7 kg)  11/24/16 142 lb (64.4 kg)  09/24/16 148 lb (67.1 kg)       Medication Review: Current Outpatient Prescriptions on File Prior to Visit  Medication Sig Dispense Refill  . aspirin 81 MG tablet Take 81 mg by mouth.     . B Complex-C (SUPER B COMPLEX PO) Take 1 tablet by mouth daily.     . Cholecalciferol (VITAMIN D) 2000 units tablet Take 1 tablet by mouth daily.    . diazepam (VALIUM) 2 MG tablet take 1/2 to 1 tablet by mouth UP TO three times a day (Patient not taking: Reported on 03/17/2017) 90 tablet 0  . fluticasone (FLONASE) 50 MCG/ACT nasal spray Place 2 sprays into both nostrils daily. (Patient not taking: Reported on 03/17/2017) 16 g 0  . Hyoscyamine Sulfate SL (LEVSIN/SL) 0.125 MG SUBL Place 1 tablet under the tongue every 6 (six) hours. (Patient not taking: Reported on 03/17/2017) 120 each 0  . latanoprost (XALATAN) 0.005 % ophthalmic solution 1 drop into both eyes once daily    . nitroGLYCERIN (NITROSTAT) 0.4 MG SL tablet Place 1 tablet (0.4 mg total) under the tongue every 5 (five) minutes as needed for chest pain. 25 tablet 4  . pantoprazole (PROTONIX) 40 MG tablet take 1 tablet by mouth once daily FOR GERD OR HEART BURN (Patient not taking: Reported on 03/17/2017) 30 tablet 5  . pregabalin (LYRICA) 50 MG capsule Take 1 capsule (50 mg total) by mouth 3 (three) times daily. (Patient not taking: Reported on 03/17/2017) 30 capsule 0   No current facility-administered medications on file prior to visit.     Allergies  Allergen Reactions  . Meloxicam Itching  . Nitrofurantoin Monohyd Macro     "flu like" symptoms   . Sulfa Antibiotics Hives    Current Problems (verified) Patient Active  Problem List   Diagnosis Date Noted  . Neuropathy 01/09/2015  . Memory changes 01/09/2015  . Hyperlipidemia 10/29/2014  . Prediabetes 10/29/2014  . History of renal cell cancer 10/29/2014  . Labile hypertension 10/29/2014  . Osteoporosis 10/29/2014  . Vitamin D deficiency   . GERD (gastroesophageal reflux disease)   . Anxiety   . Hiatal hernia     Screening Tests Immunization History  Administered Date(s) Administered  . Influenza, Seasonal, Injecte, Preservative Fre 07/28/2016  . Pneumococcal Conjugate-13 09/17/2015  . Pneumococcal Polysaccharide-23 10/04/2012  . Td 10/04/2009   Preventative care: Last colonoscopy:2017, Dr. Amedeo Plenty EGD 2017 Last mammogram: 04/2016 Last pap smear/pelvic exam: 2011  remote DEXA: 10/2014 MRI brain 10/2014 Ct AB 69/6295 US carotids LICA <2841 [place] AB 3244 CXR 2017  Prior vaccinations: TD or Tdap: 2011  Influenza: 2017 Pneumococcal: 2014 Prevnar13: 2017 Shingles/Zostavax: declines  Names of Other Physician/Practitioners you currently use:  1. [facility] here for primary care 2. Dr. Katy Fitch, eye doctor, last visit April 2017 3. Gwyndolyn Saxon, dentist, last visit 2015 Patient Care Team: Unk Pinto, MD as PCP - General (Internal Medicine) Warden Fillers, MD as Consulting Physician (Optometry) Josue Hector, MD as Consulting Physician (Cardiology) Alexis Frock, MD as Consulting Physician (Urology) Teena Irani, MD as Consulting Physician (Gastroenterology)  SURGICAL HISTORY She  has a past surgical history that includes Nephrectomy (Left, 1999); Left oophorectomy (1985); and Tubal ligation. FAMILY HISTORY Her family history includes Pneumonia in her father. SOCIAL HISTORY She  reports that she quit smoking about 32 years ago. She has never used smokeless tobacco. She reports that she drinks alcohol. She reports that she does not use drugs.   MEDICARE WELLNESS OBJECTIVES: Physical activity:  Current Exercise Habits: Home exercise routine, Type of exercise: walking, Time (Minutes): 30, Frequency (Times/Week): 3, Weekly Exercise (Minutes/Week): 90, Intensity: Mild Cardiac risk factors: Cardiac Risk Factors include: advanced age (>50men, >25 women);dyslipidemia;sedentary lifestyle;hypertension Depression/mood screen:   Depression screen [facility] 2/9 03/17/2017  Decreased Interest 0  Down, Depressed, Hopeless 0  PHQ - 2 Score 0    ADLs:  In your present state of health, do you have any difficulty performing the following activities: 03/17/2017  Hearing? N  Vision? N  Difficulty concentrating or making decisions? N  Walking or climbing stairs? N  Dressing or bathing? N  Doing errands, shopping? N  Some recent data might be hidden     Cognitive Testing  Alert? Yes  Normal Appearance?Yes  Oriented to person? Yes  Place? Yes   Time? Yes  Recall of three objects?  Yes  Can perform simple calculations? Yes  Displays appropriate judgment?Yes  Can read the correct time from a watch face?Yes  EOL planning: Does Patient Have a Medical Advance Directive?: Yes Type of Advance Directive: Healthcare Power of Attorney, Living will Copy of [place] in Chart?: No - copy requested  Review of Systems  Constitutional: Negative.   HENT: Negative.   Eyes: Negative for blurred vision, double vision, photophobia, pain, discharge and redness.  Respiratory: Negative.   Cardiovascular: Negative.   Gastrointestinal: Positive for abdominal pain. Negative for blood in stool, constipation, diarrhea, heartburn, melena, nausea and vomiting.  Genitourinary: Positive for frequency. Negative for dysuria, flank pain, hematuria and urgency.  Musculoskeletal: Negative.   Skin: Negative.   Neurological: Positive for sensory change (bilateral feet). Negative for dizziness, tingling, tremors, speech change, focal weakness, seizures and loss of consciousness.  Psychiatric/Behavioral: Negative  for depression, hallucinations, memory loss, substance abuse and suicidal ideas. The patient is not nervous/anxious and does not have insomnia.      Objective:     Today's Vitals   03/17/17 1004  BP: 118/76  Pulse: 86  Resp: 14  Temp: (!) 97.5 F (36.4 C)  SpO2: 96%  Weight: 144 lb 12.8 oz (65.7 kg)  Height: 5\' 1"  (1.549 m)  PainSc: 3    Body mass index is 27.36 kg/m.  General appearance: alert, no distress, WD/WN, female HEENT: normocephalic, sclerae anicteric, TMs pearly, nares patent, no discharge or erythema, pharynx normal Oral cavity: MMM, no lesions Neck: supple, no lymphadenopathy, no thyromegaly, no masses Heart: RRR, normal S1, S2, no murmurs Lungs: CTA bilaterally, no wheezes, rhonchi, or rales Abdomen: +bs, soft, non tender, non distended, no masses, no hepatomegaly, no splenomegaly Musculoskeletal: nontender, no swelling, no obvious deformity Extremities: no edema, no cyanosis, no clubbing Pulses: 2+ symmetric, upper and  lower extremities, normal cap refill Neurological: alert, oriented x 3, CN2-12 intact, strength normal upper extremities and lower extremities, sensation decreased bilateral feet, DTRs 2+ throughout, no cerebellar signs, gait normal Psychiatric: normal affect, behavior normal, pleasant  GYN: + cystocele, no masses appreciated, + vaginal atrophy.   Medicare Attestation I have personally reviewed: The patient's medical and social history Their use of alcohol, tobacco or illicit drugs Their current medications and supplements The patient's functional ability including ADLs,fall risks, home safety risks, cognitive, and hearing and visual impairment Diet and physical activities Evidence for depression or mood disorders  The patient's weight, height, BMI, and visual acuity have been recorded in the chart.  I have made referrals, counseling, and provided education to the patient based on review of the above and I have provided the patient with a  written personalized care plan for preventive services.     Vicie Mutters, PA-C   03/17/2017

## 2017-03-17 ENCOUNTER — Encounter: Payer: Self-pay | Admitting: Physician Assistant

## 2017-03-17 ENCOUNTER — Ambulatory Visit (INDEPENDENT_AMBULATORY_CARE_PROVIDER_SITE_OTHER): Payer: Medicare Other | Admitting: Physician Assistant

## 2017-03-17 VITALS — BP 118/76 | HR 86 | Temp 97.5°F | Resp 14 | Ht 61.0 in | Wt 144.8 lb

## 2017-03-17 DIAGNOSIS — K449 Diaphragmatic hernia without obstruction or gangrene: Secondary | ICD-10-CM

## 2017-03-17 DIAGNOSIS — K2100 Gastro-esophageal reflux disease with esophagitis, without bleeding: Secondary | ICD-10-CM

## 2017-03-17 DIAGNOSIS — Z79899 Other long term (current) drug therapy: Secondary | ICD-10-CM

## 2017-03-17 DIAGNOSIS — Z0000 Encounter for general adult medical examination without abnormal findings: Secondary | ICD-10-CM

## 2017-03-17 DIAGNOSIS — R300 Dysuria: Secondary | ICD-10-CM

## 2017-03-17 DIAGNOSIS — Z0001 Encounter for general adult medical examination with abnormal findings: Secondary | ICD-10-CM

## 2017-03-17 DIAGNOSIS — E785 Hyperlipidemia, unspecified: Secondary | ICD-10-CM | POA: Diagnosis not present

## 2017-03-17 DIAGNOSIS — Z85528 Personal history of other malignant neoplasm of kidney: Secondary | ICD-10-CM

## 2017-03-17 DIAGNOSIS — R6889 Other general symptoms and signs: Secondary | ICD-10-CM | POA: Diagnosis not present

## 2017-03-17 DIAGNOSIS — R0989 Other specified symptoms and signs involving the circulatory and respiratory systems: Secondary | ICD-10-CM | POA: Diagnosis not present

## 2017-03-17 DIAGNOSIS — R109 Unspecified abdominal pain: Secondary | ICD-10-CM | POA: Diagnosis not present

## 2017-03-17 DIAGNOSIS — R7303 Prediabetes: Secondary | ICD-10-CM | POA: Diagnosis not present

## 2017-03-17 DIAGNOSIS — E559 Vitamin D deficiency, unspecified: Secondary | ICD-10-CM | POA: Diagnosis not present

## 2017-03-17 DIAGNOSIS — F419 Anxiety disorder, unspecified: Secondary | ICD-10-CM

## 2017-03-17 DIAGNOSIS — M810 Age-related osteoporosis without current pathological fracture: Secondary | ICD-10-CM | POA: Diagnosis not present

## 2017-03-17 DIAGNOSIS — N8110 Cystocele, unspecified: Secondary | ICD-10-CM

## 2017-03-17 DIAGNOSIS — R413 Other amnesia: Secondary | ICD-10-CM

## 2017-03-17 DIAGNOSIS — G629 Polyneuropathy, unspecified: Secondary | ICD-10-CM | POA: Diagnosis not present

## 2017-03-17 LAB — CBC WITH DIFFERENTIAL/PLATELET
Basophils Absolute: 0 cells/uL (ref 0–200)
Basophils Relative: 0 %
Eosinophils Absolute: 112 cells/uL (ref 15–500)
Eosinophils Relative: 2 %
HCT: 40.5 % (ref 35.0–45.0)
Hemoglobin: 13 g/dL (ref 11.7–15.5)
Lymphocytes Relative: 41 %
Lymphs Abs: 2296 cells/uL (ref 850–3900)
MCH: 27.5 pg (ref 27.0–33.0)
MCHC: 32.1 g/dL (ref 32.0–36.0)
MCV: 85.6 fL (ref 80.0–100.0)
MPV: 9.3 fL (ref 7.5–12.5)
Monocytes Absolute: 280 cells/uL (ref 200–950)
Monocytes Relative: 5 %
Neutro Abs: 2912 cells/uL (ref 1500–7800)
Neutrophils Relative %: 52 %
Platelets: 272 10*3/uL (ref 140–400)
RBC: 4.73 MIL/uL (ref 3.80–5.10)
RDW: 14.3 % (ref 11.0–15.0)
WBC: 5.6 10*3/uL (ref 3.8–10.8)

## 2017-03-17 NOTE — Patient Instructions (Addendum)
About Cystocele  Overview  The pelvic organs, including the bladder, are normally supported by pelvic floor muscles and ligaments.  When these muscles and ligaments are stretched, weakened or torn, the wall between the bladder and the vagina sags or herniates causing a prolapse, sometimes called a cystocele.  This condition may cause discomfort and problems with emptying the bladder.  It can be present in various stages.  Some people are not aware of the changes.  Others may notice changes at the vaginal opening or a feeling of the bladder dropping outside the body.  Causes of a Cystocele  A cystocele is usually caused by muscle straining or stretching during childbirth.  In addition, cystocele is more common after menopause, because the hormone estrogen helps keep the elastic tissues around the pelvic organs strong.  A cystocele is more likely to occur when levels of estrogen decrease.  Other causes include: heavy lifting, chronic coughing, previous pelvic surgery and obesity.  Symptoms  A bladder that has dropped from its normal position may cause: unwanted urine leakage (stress incontinence), frequent urination or urge to urinate, incomplete emptying of the bladder (not feeling bladder relief after emptying), pain or discomfort in the vagina, pelvis, groin, lower back or lower abdomen and frequent urinary tract infections.  Mild cases may not cause any symptoms.  Treatment Options  Pelvic floor (Kegel) exercises:  Strength training the muscles in your genital area  Behavioral changes: Treating and preventing constipation, taking time to empty your bladder properly, learning to lift properly and/or avoid heavy lifting when possible, stopping smoking, avoiding weight gain and treating a chronic cough or bronchitis.  A pessary: A vaginal support device is sometimes used to help pelvic support caused by muscle and ligament changes.  Surgery: Surgical repair may be necessary if symptoms cannot  be managed with exercise, behavioral changes and a pessary.  Surgery is usually considered for severe cases.   2007, [employer] Free Hearing Test with no obligation # [phone] Do not have to be a member Tues-Sat 10-6  [place]- free test with no obligation # [phone] MUST BE A MEMBER Call for store hours  Have had patient's get good cheaper hearing aids from [employer] The air version has good reviews.   Your ears and sinuses are connected by the eustachian tube. When your sinuses are inflamed, this can close off the tube and cause fluid to collect in your middle ear. This can then cause dizziness, popping, clicking, ringing, and echoing in your ears. This is often NOT an infection and does NOT require antibiotics, it is caused by inflammation so the treatments help the inflammation. This can take a long time to get better so please be patient.  Here are things you can do to help with this: - Try the Flonase or Nasonex. Remember to spray each nostril twice towards the outer part of your eye.  Do not sniff but instead pinch your nose and tilt your head back to help the medicine get into your sinuses.  The best time to do this is at bedtime.Stop if you get blurred vision or nose bleeds.  -While drinking fluids, pinch and hold nose close and swallow, to help open eustachian tubes to drain fluid behind ear drums. -Please pick one of the over the counter allergy medications below and take it once daily for allergies.  It will also help with fluid behind ear drums. Claritin or loratadine cheapest but likely the weakest  Zyrtec or certizine  at night because it can make you sleepy The strongest is allegra or fexafinadine  Cheapest at [employer], [employer], [employer] -can use decongestant over the counter, please do not use if you have high blood pressure or certain heart conditions.   if worsening HA, changes vision/speech, imbalance, weakness go to the  ER Drink 80-100 oz a day of water, measure it out Eat 3 meals a day, have to do breakfast, eat protein- hard boiled eggs, protein bar like nature valley protein bar, greek yogurt like oikos triple zero, chobani 100, or light n fit greek  Muscle aches We will check your potassium, magnesium to see if these are low which can cause muscle aches.  Please make sure you are taking 64 oz of water a day as long as you do now have a heart condition.  -Please take Tylenol or Aleve for pain. -You can take tylenol (500mg ) or tylenol arthritis (650mg ). The max you can take of tylenol a day is 3000mg  daily, this is a max of 6 pills a day of the regular tyelnol (500mg ) or a max of 4 a day of the tylenol arthritis (650mg ) as long as no other medications you are taking contain tylenol.   If you have had tick exposure, we can check for lyme disease or rocky [place] spotted fever.   Muscle Pain Muscle pain (myalgia) may be caused by many things, including:  Overuse or muscle strain, especially if you are not in shape. This is the most common cause of muscle pain.  Injury.  Bruises.  Viruses, such as the flu.  Infectious diseases.  Fibromyalgia, which is a chronic condition that causes muscle tenderness, fatigue, and headache.  Autoimmune diseases, including lupus.  Certain drugs, including ACE inhibitors and statins. Muscle pain may be mild or severe. In most cases, the pain lasts only a short time and goes away without treatment. To diagnose the cause of your muscle pain, your health care provider will take your medical history. This means he or she will ask you when your muscle pain began and what has been happening. If you have not had muscle pain for very long, your health care provider may want to wait before doing much testing. If your muscle pain has lasted a long time, your health care provider may want to run tests right away. If your health care provider thinks your muscle pain may be caused by  illness, you may need to have additional tests to rule out certain conditions.  Treatment for muscle pain depends on the cause. Home care is often enough to relieve muscle pain. Your health care provider may also prescribe anti-inflammatory medicine. HOME CARE INSTRUCTIONS Watch your condition for any changes. The following actions may help to lessen any discomfort you are feeling:  Only take over-the-counter or prescription medicines as directed by your health care provider.  Apply ice to the sore muscle:  Put ice in a plastic bag.  Place a towel between your skin and the bag.  Leave the ice on for 15-20 minutes, 3-4 times a day.  You may alternate applying hot and cold packs to the muscle as directed by your health care provider.  If overuse is causing your muscle pain, slow down your activities until the pain goes away.  Remember that it is normal to feel some muscle pain after starting a workout program. Muscles that have not been used often will be sore at first.  Do regular, gentle exercises if you are not usually active.  Warm up before exercising to lower your risk of muscle pain.  Do not continue working out if the pain is very bad. Bad pain could mean you have injured a muscle. SEEK MEDICAL CARE IF:  Your muscle pain gets worse, and medicines do not help.  You have muscle pain that lasts longer than 3 days.  You have a rash or fever along with muscle pain.  You have muscle pain after a tick bite.  You have muscle pain while working out, even though you are in good physical condition.  You have redness, soreness, or swelling along with muscle pain.  You have muscle pain after starting a new medicine or changing the dose of a medicine. SEEK IMMEDIATE MEDICAL CARE IF:  You have trouble breathing.  You have trouble swallowing.  You have muscle pain along with a stiff neck, fever, and vomiting.  You have severe muscle weakness or cannot move part of your  body. MAKE SURE YOU:   Understand these instructions.  Will watch your condition.  Will get help right away if you are not doing well or get worse. Document Released: 07/02/2006 Document Revised: 08/15/2013 Document Reviewed: 06/06/2013 [facility] Patient Information 2015 [place], [place]. This information is not intended to replace advice given to you by your health care provider. Make sure you discuss any questions you have with your health care provider.

## 2017-03-18 LAB — BASIC METABOLIC PANEL WITH GFR
BUN: 14 mg/dL (ref 7–25)
CO2: 26 mmol/L (ref 20–31)
Calcium: 9.2 mg/dL (ref 8.6–10.4)
Chloride: 105 mmol/L (ref 98–110)
Creat: 0.83 mg/dL (ref 0.60–0.93)
GFR, Est African American: 81 mL/min (ref >=60)
GFR, Est Non African American: 71 mL/min (ref >=60)
Glucose, Bld: 85 mg/dL (ref 65–99)
Potassium: 4.2 mmol/L (ref 3.5–5.3)
Sodium: 137 mmol/L (ref 135–146)

## 2017-03-18 LAB — HEPATIC FUNCTION PANEL
ALT: 5 U/L — ABNORMAL LOW (ref 6–29)
AST: 17 U/L (ref 10–35)
Albumin: 4.2 g/dL (ref 3.6–5.1)
Alkaline Phosphatase: 65 U/L (ref 33–130)
Bilirubin, Direct: 0.1 mg/dL (ref <=0.2)
Indirect Bilirubin: 0.4 mg/dL (ref 0.2–1.2)
Total Bilirubin: 0.5 mg/dL (ref 0.2–1.2)
Total Protein: 6.9 g/dL (ref 6.1–8.1)

## 2017-03-18 LAB — URINALYSIS, ROUTINE W REFLEX MICROSCOPIC
Bilirubin Urine: NEGATIVE
Glucose, UA: NEGATIVE
Hgb urine dipstick: NEGATIVE
Ketones, ur: NEGATIVE
Leukocytes, UA: NEGATIVE
Nitrite: NEGATIVE
Protein, ur: NEGATIVE
Specific Gravity, Urine: 1.011 (ref 1.001–1.035)
pH: 7 (ref 5.0–8.0)

## 2017-03-18 LAB — MAGNESIUM: Magnesium: 1.9 mg/dL (ref 1.5–2.5)

## 2017-03-18 LAB — TSH: TSH: 2.39 mIU/L

## 2017-03-18 LAB — LIPID PANEL
Cholesterol: 224 mg/dL — ABNORMAL HIGH (ref <200)
HDL: 84 mg/dL (ref >50)
LDL Cholesterol: 116 mg/dL — ABNORMAL HIGH (ref <100)
Total CHOL/HDL Ratio: 2.7 Ratio (ref <5.0)
Triglycerides: 119 mg/dL (ref <150)
VLDL: 24 mg/dL (ref <30)

## 2017-03-18 LAB — HEMOGLOBIN A1C
Hgb A1c MFr Bld: 5.5 % (ref <5.7)
Mean Plasma Glucose: 111 mg/dL

## 2017-03-18 LAB — URINE CULTURE: Organism ID, Bacteria: NO GROWTH

## 2017-03-18 NOTE — Progress Notes (Signed)
Pt aware of lab results & voiced understanding of those results.

## 2017-03-22 NOTE — Progress Notes (Signed)
Pt aware of lab results & voiced understanding of those results.

## 2017-04-19 ENCOUNTER — Ambulatory Visit: Payer: BC Managed Care – PPO | Admitting: Gynecology

## 2017-05-18 ENCOUNTER — Ambulatory Visit (INDEPENDENT_AMBULATORY_CARE_PROVIDER_SITE_OTHER): Payer: Medicare Other | Admitting: Gynecology

## 2017-05-18 ENCOUNTER — Encounter: Payer: Self-pay | Admitting: Gynecology

## 2017-05-18 ENCOUNTER — Ambulatory Visit: Payer: BC Managed Care – PPO | Admitting: Gynecology

## 2017-05-18 VITALS — BP 118/72 | Ht 61.0 in | Wt 144.0 lb

## 2017-05-18 DIAGNOSIS — N952 Postmenopausal atrophic vaginitis: Secondary | ICD-10-CM | POA: Diagnosis not present

## 2017-05-18 DIAGNOSIS — M8580 Other specified disorders of bone density and structure, unspecified site: Secondary | ICD-10-CM

## 2017-05-18 DIAGNOSIS — Z124 Encounter for screening for malignant neoplasm of cervix: Secondary | ICD-10-CM

## 2017-05-18 DIAGNOSIS — R140 Abdominal distension (gaseous): Secondary | ICD-10-CM | POA: Diagnosis not present

## 2017-05-18 DIAGNOSIS — Z01411 Encounter for gynecological examination (general) (routine) with abnormal findings: Secondary | ICD-10-CM

## 2017-05-18 NOTE — Patient Instructions (Signed)
Follow-up for the ultrasound as scheduled. 

## 2017-05-18 NOTE — Addendum Note (Signed)
Addended by: Nelva Nay on: 05/18/2017 04:00 PM   Modules accepted: Orders

## 2017-05-18 NOTE — Progress Notes (Signed)
    Kimberly Wilcox 1946/02/13 [id number]        71 y.o.  G4P4 new patient who has not seen a gynecologist in a number of years for breast and pelvic exam. Also has been complaining of lower abdominal pressure and generalized discomfort. Had been treated for UTI recently. Saw her PA recently who committed GYN evaluation to rule out cystocele or other GYN etiology. Patient notes generalized pressure across the lower abdomen. No nausea vomiting diarrhea constipation. No urinary symptoms such as dysuria urgency low back pain fever or chills. Does note some frequency on and off.  Past medical history,surgical history, problem list, medications, allergies, family history and social history were all reviewed and documented as reviewed in the EPIC chart.  ROS:  Performed with pertinent positives and negatives included in the history, assessment and plan.   Additional significant findings :  None   Exam: Caryn Bee assistant Vitals:   05/18/17 1511  BP: 118/72  Weight: 144 lb (65.3 kg)  Height: 5\' 1"  (1.549 m)   Body mass index is 27.21 kg/m.  General appearance:  Normal affect, orientation and appearance. Skin: Grossly normal HEENT: Without gross lesions.  No cervical or supraclavicular adenopathy. Thyroid normal.  Lungs:  Clear without wheezing, rales or rhonchi Cardiac: RR, without RMG Abdominal:  Soft, nontender, without masses, guarding, rebound, organomegaly or hernia Breasts:  Examined lying and sitting without masses, retractions, discharge or axillary adenopathy. Pelvic:  Ext, BUS, Vagina: With atrophic changes  Cervix: With atrophic changes. Pap smear done  Uterus: Axial, normal size, shape and contour, midline and mobile nontender   Adnexa: Without masses or tenderness    Anus and perineum: Normal   Rectovaginal: Normal sphincter tone without palpated masses or tenderness.    Assessment/Plan:  71 y.o. G31P4 female for breast and pelvic exam.   1. Lower abdominal  pressure/discomfort. Exam is normal noting atrophic changes but no overt cystocele uterine prolapse rectocele or pelvic masses on bimanual. We'll check baseline urinalysis. Recommended baseline ultrasound to rule out nonpalpable abnormalities patient will schedule follow up for this. 2. Postmenopausal/atrophic genital changes. No significant hot flushes, night sweats, vaginal dryness or any vaginal bleeding. Continue to monitor and report any issues or bleeding. 3. Mammography coming due and patient knows to schedule. Breast exam normal today. 4. Colonoscopy 2018. Repeat at their recommended interval. 5. Pap smear 2014. Pap smear done today. No history of abnormal Pap smears. Per current screening guidelines I reviewed options to stop screening but she was uncomfortable with this. 6. Osteopenia. DEXA 2016 T score -2.2 FRAX 8.4%/0.8%. Patient notes that she is planning to schedule to her primary physician's office this coming year and she'll follow up with them in reference to this. Increase calcium vitamin D discussed. 7. Health maintenance. No routine lab work done as patient recently had this done through her primary physician's office. Follow up for ultrasound. Follow up for annual exam in one year.  Additional time in excess of her breast and pelvic exam was spent in direct face to face counseling and coordination of care in regards to her pelvic pressure symptoms.     Anastasio Auerbach MD, 3:52 PM 05/18/2017

## 2017-05-19 LAB — URINALYSIS W MICROSCOPIC + REFLEX CULTURE
Bacteria, UA: NONE SEEN /HPF
Bilirubin Urine: NEGATIVE
Glucose, UA: NEGATIVE
Hgb urine dipstick: NEGATIVE
Hyaline Cast: NONE SEEN /LPF
Ketones, ur: NEGATIVE
Leukocyte Esterase: NEGATIVE
Nitrites, Initial: NEGATIVE
Protein, ur: NEGATIVE
RBC / HPF: NONE SEEN /HPF (ref 0–2)
Specific Gravity, Urine: 1.011 (ref 1.001–1.03)
Squamous Epithelial / LPF: NONE SEEN /HPF (ref <=5)
WBC, UA: NONE SEEN /HPF (ref 0–5)
pH: 5.5 (ref 5.0–8.0)

## 2017-05-19 LAB — PAP IG W/ RFLX HPV ASCU

## 2017-05-19 LAB — NO CULTURE INDICATED

## 2017-05-26 ENCOUNTER — Ambulatory Visit (INDEPENDENT_AMBULATORY_CARE_PROVIDER_SITE_OTHER): Payer: Medicare Other

## 2017-05-26 ENCOUNTER — Encounter: Payer: Self-pay | Admitting: Gynecology

## 2017-05-26 ENCOUNTER — Ambulatory Visit (INDEPENDENT_AMBULATORY_CARE_PROVIDER_SITE_OTHER): Payer: Medicare Other | Admitting: Gynecology

## 2017-05-26 VITALS — BP 114/70

## 2017-05-26 DIAGNOSIS — R1030 Lower abdominal pain, unspecified: Secondary | ICD-10-CM

## 2017-05-26 DIAGNOSIS — R140 Abdominal distension (gaseous): Secondary | ICD-10-CM

## 2017-05-26 NOTE — Progress Notes (Signed)
    Kimberly Wilcox [facility] [birth year] [id number]        71 y.o.  G4P4 both upper ultrasound. History of lower abdominal pressure and discomfort. Notes bloating and cramping coming and going. Some relief with passage of gas. No nausea vomiting diarrhea constipation. No UTI symptoms. Recent colonoscopy earlier this year was normal.  Past medical history,surgical history, problem list, medications, allergies, family history and social history were all reviewed and documented in the EPIC chart.  Directed ROS with pertinent positives and negatives documented in the history of present illness/assessment and plan.  Exam: Vitals:   05/26/17 1029  BP: 114/70   General appearance:  Normal  Ultrasound shows uterus normal size with small single posterior myoma 16 x 10 x 18 mm. Endometrial echo 3.0 mm. Left adnexa normal. No ovary identified consistent with history of LSO. Right ovary atrophic with small calcification. Cul-de-sac negative.  Assessment/Plan:  71 y.o. G4P4 with lower abdominal discomfort that I suspect is GI in origin. Ultrasound is negative for pathology. I did review the small myoma with the patient. I discussed functional bowel disease despite normal colonoscopy certainly can have a GI etiology for her discomfort. I recommended she try probiotics to see if this does not help. If she continues to have discomfort then I recommended she follow up with her gastroenterologist. Recent urine analysis was negative.    Anastasio Auerbach MD, 10:43 AM 05/26/2017

## 2017-05-26 NOTE — Patient Instructions (Signed)
Try over-the-counter probiotics to see if that does not help with you abdominal pain. Otherwise I would follow up with your gastroenterologist.

## 2017-06-22 ENCOUNTER — Encounter: Payer: Self-pay | Admitting: Internal Medicine

## 2017-06-22 ENCOUNTER — Ambulatory Visit (INDEPENDENT_AMBULATORY_CARE_PROVIDER_SITE_OTHER): Payer: Medicare Other | Admitting: Internal Medicine

## 2017-06-22 VITALS — BP 112/80 | HR 68 | Temp 97.5°F | Resp 18 | Ht 61.0 in | Wt 147.0 lb

## 2017-06-22 DIAGNOSIS — Z23 Encounter for immunization: Secondary | ICD-10-CM | POA: Diagnosis not present

## 2017-06-22 DIAGNOSIS — R1011 Right upper quadrant pain: Secondary | ICD-10-CM | POA: Diagnosis not present

## 2017-06-22 NOTE — Progress Notes (Signed)
[facility]   Unk Pinto, M.D.    Uvaldo Bristle. Silverio Lay, P.A.-C      Liane Comber, Dix                [address] [place] [address] Telephone [phone] Telefax [phone]  Subjective:    Patient ID: Kimberly Wilcox, female    DOB: 1945-12-12, 71 y.o.   MRN: [id number]  HPI  Patient is a nice 71 yo WF presenting with query pains of th Rt hypochondrium similar to sx's in May 2017 which prompted a GB U/S which was Negative/Normal. (similiarly she had a negative U/S in 2015). Currently she relates a 2 week hx/o intermittent nausea, and discomfort in the R hypochondrium seeming to radiate laterally around to the R back. Patient has hx/o L Nephrectomy in 1999 for RCC.  Medication Sig  . aspirin 81 MG Take daily   . SUPER B COMPLEX  Take 1 tab daily.   Marland Kitchen VITAMIN D 2000 units Take 1 tab daily.  . diazepam  2 MG take 1/2 to 1 tab UP TO three x / day  . FLONASE nasal spray Place 2 sprays into  nostrils daily.  Marland Kitchen LEVSIN/SL 0.125 MG  Place 1 tab under the tongue every 6  hrs.  Marland Kitchen XALATAN% ophth soln 1 drop into  eyes once daily  . pantoprazole  40 MG  take 1 tab once daily FOR GERD OR HEART BURN  . NITROSTAT 0.4 MG SL  as needed for chest pain.   Allergies  Allergen Reactions  . Meloxicam Itching  . Nitrofurantoin Monohyd Macro     "flu like" symptoms   . Sulfa Antibiotics Hives   Past Medical History:  Diagnosis Date  . Anxiety   . Chest discomfort   . GERD (gastroesophageal reflux disease)   . Hiatal hernia   . Hypertension    labile, normal cath 2013  . Osteoporosis   . Renal cell cancer ([place]) 1999   LEFT NEPHRECTOMY  . Vitamin D deficiency    Past Surgical History:  Procedure Laterality Date  . LEFT OOPHORECTOMY  1985   Benign tumor  . NEPHRECTOMY Left 1999   secondary to cancer  . TUBAL LIGATION     Review of Systems  10 point systems review negative except  as above.    Objective:   Physical Exam  BP 112/80   Pulse 68   Temp (!) 97.5 F (36.4 C)   Resp 18   Ht 5\' 1"  (1.549 m)   Wt 147 lb (66.7 kg)   BMI 27.78 kg/m    In no acute distress. No rash, cyanosis or Icterus.   HEENT - WNL. Neck - supple.  Chest - Clear equal BS. Cor - Nl HS. RRR w/o sig MGR. PP 1(+). No edema. Abd - (+) Murphy's sign. Mild G, No RB. BS Nl. MS- FROM w/o deformities.  Gait Nl. Neuro -  Nl w/o focal abnormalities.   Assessment & Plan:   1. RUQ abdominal pain  - given dietary precautions pend U/S result   - If U/S negative this 3rd time, anticipate a Hida scan  - CBC with Differential/Platelet - BASIC METABOLIC PANEL WITH GFR - Hepatic function panel - Amylase - US Abdomen Limited RUQ; Future  2. Need for prophylactic vaccination and inoculation against cholera alone  -  Flu vaccine HIGH DOSE PF (Fluzone High dose)

## 2017-06-22 NOTE — Patient Instructions (Signed)
Suspicious for  Cholelithiasis  Cholelithiasis is a form of gallbladder disease in which gallstones form in the gallbladder. The gallbladder is an organ that stores bile. Bile is made in the liver, and it helps to digest fats. Gallstones begin as small crystals and slowly grow into stones. They may cause no symptoms until the gallbladder tightens (contracts) and a gallstone is blocking the duct (gallbladder attack), which can cause pain. Cholelithiasis is also referred to as gallstones. There are two main types of gallstones:  Cholesterol stones. These are made of hardened cholesterol and are usually yellow-green in color. They are the most common type of gallstone. Cholesterol is a white, waxy, fat-like substance that is made in the liver.  Pigment stones. These are dark in color and are made of a red-yellow substance that forms when hemoglobin from red blood cells breaks down (bilirubin).  What are the causes? This condition may be caused by an imbalance in the substances that bile is made of. This can happen if the bile:  Has too much bilirubin.  Has too much cholesterol.  Does not have enough bile salts. These salts help the body absorb and digest fats.  In some cases, this condition can also be caused by the gallbladder not emptying completely or often enough. What increases the risk? The following factors may make you more likely to develop this condition:  Being female.  Having multiple pregnancies. Health care providers sometimes advise removing diseased gallbladders before future pregnancies.  Eating a diet that is heavy in fried foods, fat, and refined carbohydrates, like white bread and white rice.  Being obese.  Being older than age 72.  Prolonged use of medicines that contain female hormones (estrogen).  Having diabetes mellitus.  Rapidly losing weight.  Having a family history of gallstones.  Being of [place] or [place] descent.  Having an intestinal  disease such as Crohn disease.  Having metabolic syndrome.  Having cirrhosis.  Having severe types of anemia such as sickle cell anemia.  What are the signs or symptoms? In most cases, there are no symptoms. These are known as silent gallstones. If a gallstone blocks the bile ducts, it can cause a gallbladder attack. The main symptom of a gallbladder attack is sudden pain in the upper right abdomen. The pain usually comes at night or after eating a large meal. The pain can last for one or several hours and can spread to the right shoulder or chest. If the bile duct is blocked for more than a few hours, it can cause infection or inflammation of the gallbladder, liver, or pancreas, which may cause:  Nausea.  Vomiting.  Abdominal pain that lasts for 5 hours or more.  Fever or chills.  Yellowing of the skin or the whites of the eyes (jaundice).  Dark urine.  Light-colored stools.  How is this diagnosed? This condition may be diagnosed based on:  A physical exam.  Your medical history.  An ultrasound of your gallbladder.  CT scan.  MRI.  Blood tests to check for signs of infection or inflammation.  A scan of your gallbladder and bile ducts (biliary system) using nonharmful radioactive material and special cameras that can see the radioactive material (cholescintigram). This test checks to see how your gallbladder contracts and whether bile ducts are blocked.  Inserting a small tube with a camera on the end (endoscope) through your mouth to inspect bile ducts and check for blockages (endoscopic retrograde cholangiopancreatogram).  How is this treated? Treatment for  gallstones depends on the severity of the condition. Silent gallstones do not need treatment. If the gallstones cause a gallbladder attack or other symptoms, treatment may be required. Options for treatment include:  Surgery to remove the gallbladder (cholecystectomy). This is the most common  treatment.  Medicines to dissolve gallstones. These are most effective at treating small gallstones. You may need to take medicines for up to 6-12 months.  Shock wave treatment (extracorporeal biliary lithotripsy). In this treatment, an ultrasound machine sends shock waves to the gallbladder to break gallstones into smaller pieces. These pieces can then be passed into the intestines or be dissolved by medicine. This is rarely used.  Removing gallstones through endoscopic retrograde cholangiopancreatogram. A small basket can be attached to the endoscope and used to capture and remove gallstones.  Follow these instructions at home:  Take over-the-counter and prescription medicines only as told by your health care provider.  Maintain a healthy weight and follow a healthy diet. This includes: ? Reducing fatty foods, such as fried food. ? Reducing refined carbohydrates, like white bread and white rice. ? Increasing fiber. Aim for foods like almonds, fruit, and beans.  Keep all follow-up visits as told by your health care provider. This is important. Contact a health care provider if:  You think you have had a gallbladder attack.  You have been diagnosed with silent gallstones and you develop abdominal pain or indigestion. Get help right away if:  You have pain from a gallbladder attack that lasts for more than 2 hours.  You have abdominal pain that lasts for more than 5 hours.  You have a fever or chills.  You have persistent nausea and vomiting.  You develop jaundice.  You have dark urine or light-colored stools. Summary  Cholelithiasis (also called gallstones) is a form of gallbladder disease in which gallstones form in the gallbladder.  This condition is caused by an imbalance in the substances that make up bile. This can happen if the bile has too much cholesterol, too much bilirubin, or not enough bile salts.  You are more likely to develop this condition if you are female,  pregnant, using medicines with estrogen, obese, older than age 72, or have a family history of gallstones. You may also develop gallstones if you have diabetes, an intestinal disease, cirrhosis, or metabolic syndrome.  Treatment for gallstones depends on the severity of the condition. Silent gallstones do not need treatment.  If gallstones cause a gallbladder attack or other symptoms, treatment may be needed. The most common treatment is surgery to remove the gallbladder. This information is not intended to replace advice given to you by your health care provider. Make sure you discuss any questions you have with your health care provider. Document Released: 08/06/2005 Document Revised: 04/26/2016 Document Reviewed: 04/26/2016 Elsevier Interactive Patient Education  2017 [employer].

## 2017-06-23 LAB — BASIC METABOLIC PANEL WITH GFR
BUN: 12 mg/dL (ref 7–25)
CO2: 28 mmol/L (ref 20–32)
Calcium: 9.9 mg/dL (ref 8.6–10.4)
Chloride: 104 mmol/L (ref 98–110)
Creat: 0.79 mg/dL (ref 0.60–0.93)
GFR, Est African American: 87 mL/min/{1.73_m2} (ref >=60)
GFR, Est Non African American: 75 mL/min/{1.73_m2} (ref >=60)
Glucose, Bld: 86 mg/dL (ref 65–99)
Potassium: 5 mmol/L (ref 3.5–5.3)
Sodium: 141 mmol/L (ref 135–146)

## 2017-06-23 LAB — CBC WITH DIFFERENTIAL/PLATELET
Basophils Absolute: 32 cells/uL (ref 0–200)
Basophils Relative: 0.4 %
Eosinophils Absolute: 97 cells/uL (ref 15–500)
Eosinophils Relative: 1.2 %
HCT: 39.6 % (ref 35.0–45.0)
Hemoglobin: 13.2 g/dL (ref 11.7–15.5)
Lymphs Abs: 2300 cells/uL (ref 850–3900)
MCH: 27 pg (ref 27.0–33.0)
MCHC: 33.3 g/dL (ref 32.0–36.0)
MCV: 81.1 fL (ref 80.0–100.0)
MPV: 10.3 fL (ref 7.5–12.5)
Monocytes Relative: 7.9 %
Neutro Abs: 5030 cells/uL (ref 1500–7800)
Neutrophils Relative %: 62.1 %
Platelets: 267 10*3/uL (ref 140–400)
RBC: 4.88 10*6/uL (ref 3.80–5.10)
RDW: 12.8 % (ref 11.0–15.0)
Total Lymphocyte: 28.4 %
WBC mixed population: 640 cells/uL (ref 200–950)
WBC: 8.1 10*3/uL (ref 3.8–10.8)

## 2017-06-23 LAB — HEPATIC FUNCTION PANEL
AG Ratio: 1.5 (calc) (ref 1.0–2.5)
ALT: 6 U/L (ref 6–29)
AST: 17 U/L (ref 10–35)
Albumin: 4.3 g/dL (ref 3.6–5.1)
Alkaline phosphatase (APISO): 74 U/L (ref 33–130)
Bilirubin, Direct: 0.1 mg/dL (ref 0.0–0.2)
Globulin: 2.9 g/dL (calc) (ref 1.9–3.7)
Indirect Bilirubin: 0.5 mg/dL (calc) (ref 0.2–1.2)
Total Bilirubin: 0.6 mg/dL (ref 0.2–1.2)
Total Protein: 7.2 g/dL (ref 6.1–8.1)

## 2017-06-23 LAB — AMYLASE: Amylase: 37 U/L (ref 21–101)

## 2017-06-24 ENCOUNTER — Ambulatory Visit (HOSPITAL_COMMUNITY)
Admission: RE | Admit: 2017-06-24 | Discharge: 2017-06-24 | Disposition: A | Discharge status: Home / Self Care | Payer: Medicare Other | Source: Ambulatory Visit | Attending: Internal Medicine | Admitting: Internal Medicine

## 2017-06-24 ENCOUNTER — Other Ambulatory Visit: Payer: Self-pay | Admitting: Internal Medicine

## 2017-06-24 DIAGNOSIS — R140 Abdominal distension (gaseous): Secondary | ICD-10-CM | POA: Insufficient documentation

## 2017-06-24 DIAGNOSIS — R1011 Right upper quadrant pain: Secondary | ICD-10-CM

## 2017-06-24 DIAGNOSIS — K838 Other specified diseases of biliary tract: Secondary | ICD-10-CM | POA: Insufficient documentation

## 2017-06-29 ENCOUNTER — Ambulatory Visit (HOSPITAL_COMMUNITY)
Admission: RE | Admit: 2017-06-29 | Discharge: 2017-06-29 | Disposition: A | Discharge status: Home / Self Care | Payer: Medicare Other | Source: Ambulatory Visit | Attending: Internal Medicine | Admitting: Internal Medicine

## 2017-06-29 DIAGNOSIS — R9389 Abnormal findings on diagnostic imaging of other specified body structures: Secondary | ICD-10-CM | POA: Insufficient documentation

## 2017-06-29 DIAGNOSIS — R1011 Right upper quadrant pain: Secondary | ICD-10-CM | POA: Diagnosis not present

## 2017-06-29 MED ORDER — TECHNETIUM TC 99M MEBROFENIN IV KIT
4.5000 | PACK | Freq: Once | INTRAVENOUS | Status: AC | PRN
  Administered 2017-06-29: 4.5 via INTRAVENOUS

## 2017-06-30 ENCOUNTER — Other Ambulatory Visit: Payer: Self-pay | Admitting: Internal Medicine

## 2017-06-30 DIAGNOSIS — Z139 Encounter for screening, unspecified: Secondary | ICD-10-CM

## 2017-07-02 DIAGNOSIS — H2511 Age-related nuclear cataract, right eye: Secondary | ICD-10-CM | POA: Diagnosis not present

## 2017-07-02 DIAGNOSIS — H40053 Ocular hypertension, bilateral: Secondary | ICD-10-CM | POA: Diagnosis not present

## 2017-07-02 DIAGNOSIS — H04123 Dry eye syndrome of bilateral lacrimal glands: Secondary | ICD-10-CM | POA: Diagnosis not present

## 2017-07-02 DIAGNOSIS — Z961 Presence of intraocular lens: Secondary | ICD-10-CM | POA: Diagnosis not present

## 2017-07-22 ENCOUNTER — Other Ambulatory Visit: Payer: Self-pay | Admitting: Internal Medicine

## 2017-07-27 ENCOUNTER — Encounter: Payer: Self-pay | Admitting: Adult Health

## 2017-07-27 ENCOUNTER — Ambulatory Visit (HOSPITAL_COMMUNITY)
Admission: RE | Admit: 2017-07-27 | Discharge: 2017-07-27 | Disposition: A | Discharge status: Home / Self Care | Payer: Medicare Other | Source: Ambulatory Visit | Attending: Adult Health | Admitting: Adult Health

## 2017-07-27 ENCOUNTER — Ambulatory Visit (INDEPENDENT_AMBULATORY_CARE_PROVIDER_SITE_OTHER): Payer: Medicare Other | Admitting: Adult Health

## 2017-07-27 VITALS — BP 106/76 | HR 70 | Temp 97.5°F | Ht 61.0 in | Wt 146.8 lb

## 2017-07-27 DIAGNOSIS — M898X6 Other specified disorders of bone, lower leg: Secondary | ICD-10-CM | POA: Diagnosis not present

## 2017-07-27 DIAGNOSIS — S8011XA Contusion of right lower leg, initial encounter: Secondary | ICD-10-CM | POA: Diagnosis not present

## 2017-07-27 NOTE — Patient Instructions (Signed)
Tibial Fracture, Adult A tibial fracture is a break in the larger bone of your lower leg (tibia). This bone is also called the shin bone. What are the causes?  Low-energy injuries, such as a fall from ground level.  High-energy injuries, such as motor vehicle injuries or high-speed sports collisions. What increases the risk?  Jumping activities.  Repetitive stress, such as long-distance running.  Participation in sports.  Osteoporosis.  Advanced age. What are the signs or symptoms?  Pain.  Swelling.  Inability to put weight on your injured leg.  Bone deformities at the site of your injury.  Bruising. How is this diagnosed? A tibial fracture can usually be diagnosed using X-rays. How is this treated? A tibial fracture will often be treated with simple immobilization. A cast or splint will be used on your leg to keep it from moving while it heals. If the injury caused parts of the bone to move out of place, your health care provider may reposition those parts before putting on your cast or splint. The cast or splint will remain in place until your health care provider thinks the bone has healed well enough. Then you can begin range-of-motion exercises to regain your knee motion. For severe injuries, surgery is sometimes needed to insert plates or screws into the injured area. Follow these instructions at home:  If you have a plaster or fiberglass cast: ? Do not try to scratch the skin under the cast using sharp or pointed objects. ? Check the skin around the cast every day. You may put lotion on any red or sore areas. ? Keep your cast dry and clean.  If you have a plaster splint: ? Wear the splint as directed. ? Loosen the elastic around the splint if your toes become numb, tingle, or turn cold or blue.  Do not put pressure on any part of your cast or splint until it is fully hardened.  Use a plastic bag to protect your cast or splint during bathing. Do not lower the cast  or splint into water.  Use crutches as directed.  Take medicines only as directed by your health care provider.  Keep all follow-up visits as directed by your health care provider. This is important. Contact a health care provider if:  Your pain is becoming worse rather than better or is not controlled with medicines.  You have increased swelling or redness in your foot.  You begin to lose feeling in your foot or toes. Get help right away if:  Your foot or toes on the injured side feel cold or turn blue.  You develop severe pain in your injured leg, especially if the pain is increased with movement of your toes. This information is not intended to replace advice given to you by your health care provider. Make sure you discuss any questions you have with your health care provider. Document Released: 05/05/2001 Document Revised: 04/12/2016 Document Reviewed: 10/04/2013 Elsevier Interactive Patient Education  [employer].

## 2017-07-27 NOTE — Progress Notes (Signed)
Assessment and Plan:  Kimberly Wilcox was seen today for leg pain.  Diagnoses and all orders for this visit:  Pain of right tibia R/o hairline fracture vs contusion - will refer to ortho as appropriate May continue OTC analgesics as needed -     DG Tibia/Fibula Right; Future  Further disposition pending results of labs. Discussed med's effects and SE's.   Over 15 minutes of exam, counseling, chart review, and critical decision making was performed.   Future Appointments  Date Time Provider [place]  07/28/2017  8:10 AM [facility] 3 [facility]  09/24/2017 10:30 AM Unk Pinto, MD [facility] None  03/17/2018 10:00 AM Vicie Mutters, PA-C [facility] None    ------------------------------------------------------------------------------------------------------------------   HPI BP 106/76   Pulse 70   Temp (!) 97.5 F (36.4 C)   Ht 5\' 1"  (1.549 m)   Wt 146 lb 12.8 oz (66.6 kg)   SpO2 99%   BMI 27.74 kg/m   71 y.o.female presents for R shin pain after she hit it while carrying a folding table over Thanksgiving - 2 weeks ago. She reports the area immediately swelled up significantly with swelling through her ankle, was bruised until very recently. She presents with some localized swelling over R anterior mid-low tibia - very tender to palpation without palpable bony abnormality.  She reports she is able to bear weight but continues to have pain - 5/5 - strong ache, non-radiating - which she feels may be getting worse. She is concerned about fracture.   Past Medical History:  Diagnosis Date  . Anxiety   . Chest discomfort   . GERD (gastroesophageal reflux disease)   . Hiatal hernia   . Hypertension    labile, normal cath 2013  . Osteoporosis   . Renal cell cancer ([place]) 1999   LEFT NEPHRECTOMY  . Vitamin D deficiency      Allergies  Allergen Reactions  . Meloxicam Itching  . Nitrofurantoin Monohyd Macro     "flu like" symptoms   . Sulfa Antibiotics Hives     Current Outpatient Medications on File Prior to Visit  Medication Sig  . aspirin 81 MG tablet Take 81 mg by mouth.   . B Complex-C (SUPER B COMPLEX PO) Take 1 tablet by mouth daily.   . Cholecalciferol (VITAMIN D) 2000 units tablet Take 1 tablet by mouth daily.  . diazepam (VALIUM) 2 MG tablet Take 1/2 to 1 tablet 2 to 3 x / day for acute anxiety &  please try to limit to 5 days /week to avoid addiction  . fluticasone (FLONASE) 50 MCG/ACT nasal spray Place 2 sprays into both nostrils daily.  Marland Kitchen Hyoscyamine Sulfate SL (LEVSIN/SL) 0.125 MG SUBL Place 1 tablet under the tongue every 6 (six) hours.  Marland Kitchen latanoprost (XALATAN) 0.005 % ophthalmic solution 1 drop into both eyes once daily  . pantoprazole (PROTONIX) 40 MG tablet take 1 tablet by mouth once daily FOR GERD OR HEART BURN  . pregabalin (LYRICA) 50 MG capsule Take 1 capsule (50 mg total) by mouth 3 (three) times daily.  . nitroGLYCERIN (NITROSTAT) 0.4 MG SL tablet Place 1 tablet (0.4 mg total) under the tongue every 5 (five) minutes as needed for chest pain.   No current facility-administered medications on file prior to visit.     ROS: all negative except above.   Physical Exam:  BP 106/76   Pulse 70   Temp (!) 97.5 F (36.4 C)   Ht 5\' 1"  (1.549 m)  Wt 146 lb 12.8 oz (66.6 kg)   SpO2 99%   BMI 27.74 kg/m   General Appearance: Well nourished, in no apparent distress. Neck: Supple.  Respiratory: Respiratory effort normal, BS equal bilaterally without rales, rhonchi, wheezing or stridor.  Cardio: RRR with no MRGs. Brisk peripheral pulses without edema.  Abdomen: Soft, + BS.Marland Kitchen  Musculoskeletal: Full ROM, 5/5 strength, normal gait. R anterior mid tibia with localized swelling - very tender to touch, no palpable bony abnormality, no ecchymosis, not injected.  Skin: Warm, dry without rashes, lesions, ecchymosis.  Neuro: Cranial nerves intact. Normal muscle tone, no cerebellar symptoms. Sensation intact.  Psych: Awake and  oriented X 3, normal affect, Insight and Judgment appropriate.     Kimberly Ribas, NP 11:41 AM [facility]

## 2017-07-28 ENCOUNTER — Ambulatory Visit
Admission: RE | Admit: 2017-07-28 | Discharge: 2017-07-28 | Disposition: A | Discharge status: Home / Self Care | Payer: Medicare Other | Source: Ambulatory Visit | Attending: Internal Medicine | Admitting: Internal Medicine

## 2017-07-28 ENCOUNTER — Ambulatory Visit: Payer: Medicare Other

## 2017-07-28 DIAGNOSIS — Z1231 Encounter for screening mammogram for malignant neoplasm of breast: Secondary | ICD-10-CM | POA: Diagnosis not present

## 2017-07-28 DIAGNOSIS — Z139 Encounter for screening, unspecified: Secondary | ICD-10-CM

## 2017-09-23 NOTE — Progress Notes (Signed)
This very nice 72 y.o.female presents for 6 month follow up with Hypertension, Hyperlipidemia, Pre-Diabetes and Vitamin D Deficiency. Patient has GERD controlled on prudent diet & meds. Patient is s/p L Nephrectomy in 1999 for RCC and is w/o recurrence.      Patient is followed expectantly for labile  HTN & BP has been controlled at home. Today's BP is at goal - 130/72.  Heart Cath in 2013 was Normal w/ No CAD. Patient has had no complaints of any cardiac type chest pain, palpitations, dyspnea / orthopnea / PND, dizziness, claudication, or dependent edema.     Hyperlipidemia is controlled with diet & meds. Patient denies myalgias or other med SE's. Last Lipids were  Lab Results  Component Value Date   CHOL 224 (H) 03/17/2017   HDL 84 03/17/2017   LDLCALC 116 (H) 03/17/2017   TRIG 119 03/17/2017   CHOLHDL 2.7 03/17/2017      Also, the patient is monitored expectantly for PreDiabetes and has had no symptoms of reactive hypoglycemia, diabetic polys, paresthesias or visual blurring.  Last A1c was Normal & at goal: Lab Results  Component Value Date   HGBA1C 5.5 03/17/2017      Further, the patient also has history of Vitamin D Deficiency and supplements vitamin D without any suspected side-effects. Last vitamin D was at goal:  Lab Results  Component Value Date   VD25OH 52 10/29/2014   Current Outpatient Medications on File Prior to Visit  Medication Sig  . B Complex-C (SUPER B COMPLEX PO) Take 1 tablet by mouth daily.   . Cholecalciferol (VITAMIN D) 2000 units tablet Take 1 tablet by mouth daily.  . diazepam (VALIUM) 2 MG tablet Take 1/2 to 1 tablet 2 to 3 x / day for acute anxiety &  please try to limit to 5 days /week to avoid addiction  . fluticasone (FLONASE) 50 MCG/ACT nasal spray Place 2 sprays into both nostrils daily.  Marland Kitchen latanoprost (XALATAN) 0.005 % ophthalmic solution 1 drop into both eyes once daily  . pantoprazole (PROTONIX) 40 MG tablet take 1 tablet by mouth once daily  FOR GERD OR HEART BURN  . nitroGLYCERIN (NITROSTAT) 0.4 MG SL tablet Place 1 tablet (0.4 mg total) under the tongue every 5 (five) minutes as needed for chest pain.   No current facility-administered medications on file prior to visit.    Allergies  Allergen Reactions  . Meloxicam Itching  . Nitrofurantoin Monohyd Macro     "flu like" symptoms   . Sulfa Antibiotics Hives   PMHx:   Past Medical History:  Diagnosis Date  . Anxiety   . Chest discomfort   . GERD (gastroesophageal reflux disease)   . Hiatal hernia   . Hypertension    labile, normal cath 2013  . Osteoporosis   . Renal cell cancer ([place]) 1999   LEFT NEPHRECTOMY  . Vitamin D deficiency    Immunization History  Administered Date(s) Administered  . Influenza, High Dose Seasonal PF 06/22/2017  . Influenza, Seasonal, Injecte, Preservative Fre 07/28/2016  . Pneumococcal Conjugate-13 09/17/2015  . Pneumococcal Polysaccharide-23 10/04/2012  . Td 10/04/2009   Past Surgical History:  Procedure Laterality Date  . LEFT OOPHORECTOMY  1985   Benign tumor  . NEPHRECTOMY Left 1999   secondary to cancer  . TUBAL LIGATION     FHx:    Reviewed / unchanged  SHx:    Reviewed / unchanged  Systems Review:  Constitutional: Denies fever, chills, wt  changes, headaches, insomnia, fatigue, night sweats, change in appetite. Eyes: Denies redness, blurred vision, diplopia, discharge, itchy, watery eyes.  ENT: Denies discharge, congestion, post nasal drip, epistaxis, sore throat, earache, hearing loss, dental pain, tinnitus, vertigo, sinus pain, snoring.  CV: Denies chest pain, palpitations, irregular heartbeat, syncope, dyspnea, diaphoresis, orthopnea, PND, claudication or edema. Respiratory: denies cough, dyspnea, DOE, pleurisy, hoarseness, laryngitis, wheezing.  Gastrointestinal: Denies dysphagia, odynophagia, heartburn, reflux, water brash, abdominal pain or cramps, nausea, vomiting, bloating, diarrhea, constipation, hematemesis,  melena, hematochezia  or hemorrhoids. Genitourinary: Denies dysuria, frequency, urgency, nocturia, hesitancy, discharge, hematuria or flank pain. Musculoskeletal: Denies arthralgias, myalgias, stiffness, jt. swelling, pain, limping or strain/sprain.  Skin: Denies pruritus, rash, hives, warts, acne, eczema or change in skin lesion(s). Neuro: No weakness, tremor, incoordination, spasms, paresthesia or pain. Psychiatric: Denies confusion, memory loss or sensory loss. Endo: Denies change in weight, skin or hair change.  Heme/Lymph: No excessive bleeding, bruising or enlarged lymph nodes.  Physical Exam  BP 130/72   Pulse 72   Temp 97.9 F (36.6 C)   Resp 16   Ht 5\' 1"  (1.549 m)   Wt 144 lb 9.6 oz (65.6 kg)   BMI 27.32 kg/m   Appears well nourished, well groomed  and in no distress.  Eyes: PERRLA, EOMs, conjunctiva no swelling or erythema. Sinuses: No frontal/maxillary tenderness ENT/Mouth: EAC's clear, TM's nl w/o erythema, bulging. Nares clear w/o erythema, swelling, exudates. Oropharynx clear without erythema or exudates. Oral hygiene is good. Tongue normal, non obstructing. Hearing intact.  Neck: Supple. Thyroid nl. Car 2+/2+ without bruits, nodes or JVD. Chest: Respirations nl with BS clear & equal w/o rales, rhonchi, wheezing or stridor.  Cor: Heart sounds normal w/ regular rate and rhythm without sig. murmurs, gallops, clicks or rubs. Peripheral pulses normal and equal  without edema.  Abdomen: Soft & bowel sounds normal. Non-tender w/o guarding, rebound, hernias, masses or organomegaly.  Lymphatics: Unremarkable.  Musculoskeletal: Full ROM all peripheral extremities, joint stability, 5/5 strength and normal gait.  Skin: Warm, dry without exposed rashes, lesions or ecchymosis apparent.  Neuro: Cranial nerves intact, reflexes equal bilaterally. Sensory-motor testing grossly intact. Tendon reflexes grossly intact.  Pysch: Alert & oriented x 3.  Insight and judgement nl &  appropriate. No ideations.  Assessment and Plan:  1. Labile hypertension  - Continue medication, monitor blood pressure at home.  - Continue DASH diet. Reminder to go to the ER if any CP,  SOB, nausea, dizziness, severe HA, changes vision/speech.  - CBC with Differential/Platelet - BASIC METABOLIC PANEL WITH GFR - Magnesium - TSH  2. Hyperlipidemia, unspecified hyperlipidemia type - Continue diet/meds, exercise,& lifestyle modifications.   - Continue monitor periodic cholesterol/liver & renal functions  - Hepatic function panel - Lipid panel - TSH  3. Prediabetes  - Continue diet, exercise, lifestyle modifications.  - Monitor appropriate labs.  - Hemoglobin A1c - Insulin, random  4. Vitamin D deficiency  - Continue supplementation. - VITAMIN D 25 Hydroxy    5. Abnormal glucose  - Hemoglobin A1c - Insulin, random  6. Gastroesophageal reflux disease  - CBC with Differential/Platelet  7. Medication management  - CBC with Differential/Platelet - BASIC METABOLIC PANEL WITH GFR - Hepatic function panel - Magnesium - Lipid panel - TSH - Hemoglobin A1c - Insulin, random - VITAMIN D 25 Hydroxy         Discussed  regular exercise, BP monitoring, weight control to achieve/maintain BMI less than 25 and discussed med and SE's. Recommended labs to assess  and monitor clinical status with further disposition pending results of labs. Over 30 minutes of exam, counseling, chart review was performed.

## 2017-09-23 NOTE — Patient Instructions (Signed)

## 2017-09-24 ENCOUNTER — Ambulatory Visit: Payer: Medicare Other | Admitting: Internal Medicine

## 2017-09-24 VITALS — BP 130/72 | HR 72 | Temp 97.9°F | Resp 16 | Ht 61.0 in | Wt 144.6 lb

## 2017-09-24 DIAGNOSIS — R7303 Prediabetes: Secondary | ICD-10-CM | POA: Diagnosis not present

## 2017-09-24 DIAGNOSIS — Z79899 Other long term (current) drug therapy: Secondary | ICD-10-CM | POA: Diagnosis not present

## 2017-09-24 DIAGNOSIS — E785 Hyperlipidemia, unspecified: Secondary | ICD-10-CM

## 2017-09-24 DIAGNOSIS — M26622 Arthralgia of left temporomandibular joint: Secondary | ICD-10-CM

## 2017-09-24 DIAGNOSIS — R0989 Other specified symptoms and signs involving the circulatory and respiratory systems: Secondary | ICD-10-CM

## 2017-09-24 DIAGNOSIS — R7309 Other abnormal glucose: Secondary | ICD-10-CM | POA: Diagnosis not present

## 2017-09-24 DIAGNOSIS — E559 Vitamin D deficiency, unspecified: Secondary | ICD-10-CM

## 2017-09-24 DIAGNOSIS — K2100 Gastro-esophageal reflux disease with esophagitis, without bleeding: Secondary | ICD-10-CM

## 2017-09-24 MED ORDER — PREDNISONE 20 MG PO TABS: ORAL_TABLET | ORAL | 0 refills | Status: DC

## 2017-09-25 ENCOUNTER — Encounter: Payer: Self-pay | Admitting: Internal Medicine

## 2017-09-27 LAB — HEPATIC FUNCTION PANEL
AG Ratio: 1.7 (calc) (ref 1.0–2.5)
ALT: 7 U/L (ref 6–29)
AST: 19 U/L (ref 10–35)
Albumin: 4.5 g/dL (ref 3.6–5.1)
Alkaline phosphatase (APISO): 66 U/L (ref 33–130)
Bilirubin, Direct: 0.1 mg/dL (ref 0.0–0.2)
Globulin: 2.6 g/dL (calc) (ref 1.9–3.7)
Indirect Bilirubin: 0.4 mg/dL (calc) (ref 0.2–1.2)
Total Bilirubin: 0.5 mg/dL (ref 0.2–1.2)
Total Protein: 7.1 g/dL (ref 6.1–8.1)

## 2017-09-27 LAB — CBC WITH DIFFERENTIAL/PLATELET
Basophils Absolute: 41 cells/uL (ref 0–200)
Basophils Relative: 0.7 %
Eosinophils Absolute: 118 cells/uL (ref 15–500)
Eosinophils Relative: 2 %
HCT: 40.8 % (ref 35.0–45.0)
Hemoglobin: 13.6 g/dL (ref 11.7–15.5)
Lymphs Abs: 2154 cells/uL (ref 850–3900)
MCH: 27.2 pg (ref 27.0–33.0)
MCHC: 33.3 g/dL (ref 32.0–36.0)
MCV: 81.6 fL (ref 80.0–100.0)
MPV: 10.4 fL (ref 7.5–12.5)
Monocytes Relative: 6.3 %
Neutro Abs: 3216 cells/uL (ref 1500–7800)
Neutrophils Relative %: 54.5 %
Platelets: 260 10*3/uL (ref 140–400)
RBC: 5 10*6/uL (ref 3.80–5.10)
RDW: 13 % (ref 11.0–15.0)
Total Lymphocyte: 36.5 %
WBC mixed population: 372 cells/uL (ref 200–950)
WBC: 5.9 10*3/uL (ref 3.8–10.8)

## 2017-09-27 LAB — LIPID PANEL
Cholesterol: 214 mg/dL — ABNORMAL HIGH (ref <200)
HDL: 84 mg/dL (ref >50)
LDL Cholesterol (Calc): 110 mg/dL (calc) — ABNORMAL HIGH
Non-HDL Cholesterol (Calc): 130 mg/dL (calc) — ABNORMAL HIGH (ref <130)
Total CHOL/HDL Ratio: 2.5 (calc) (ref <5.0)
Triglycerides: 101 mg/dL (ref <150)

## 2017-09-27 LAB — BASIC METABOLIC PANEL WITH GFR
BUN: 11 mg/dL (ref 7–25)
CO2: 29 mmol/L (ref 20–32)
Calcium: 9.6 mg/dL (ref 8.6–10.4)
Chloride: 106 mmol/L (ref 98–110)
Creat: 0.73 mg/dL (ref 0.60–0.93)
GFR, Est African American: 95 mL/min/{1.73_m2} (ref >=60)
GFR, Est Non African American: 82 mL/min/{1.73_m2} (ref >=60)
Glucose, Bld: 89 mg/dL (ref 65–99)
Potassium: 4.5 mmol/L (ref 3.5–5.3)
Sodium: 140 mmol/L (ref 135–146)

## 2017-09-27 LAB — TSH: TSH: 2.42 mIU/L (ref 0.40–4.50)

## 2017-09-27 LAB — VITAMIN D 25 HYDROXY (VIT D DEFICIENCY, FRACTURES): Vit D, 25-Hydroxy: 32 ng/mL (ref 30–100)

## 2017-09-27 LAB — HEMOGLOBIN A1C
Hgb A1c MFr Bld: 5.6 % of total Hgb (ref <5.7)
Mean Plasma Glucose: 114 (calc)
eAG (mmol/L): 6.3 (calc)

## 2017-09-27 LAB — INSULIN, RANDOM: Insulin: 3.8 u[IU]/mL (ref 2.0–19.6)

## 2017-09-27 LAB — MAGNESIUM: Magnesium: 1.9 mg/dL (ref 1.5–2.5)

## 2017-12-06 ENCOUNTER — Other Ambulatory Visit: Payer: Self-pay | Admitting: Gastroenterology

## 2017-12-06 DIAGNOSIS — R1011 Right upper quadrant pain: Secondary | ICD-10-CM

## 2017-12-10 ENCOUNTER — Ambulatory Visit
Admission: RE | Admit: 2017-12-10 | Discharge: 2017-12-10 | Disposition: A | Discharge status: Home / Self Care | Payer: Medicare Other | Source: Ambulatory Visit | Attending: Gastroenterology | Admitting: Gastroenterology

## 2017-12-10 DIAGNOSIS — R1011 Right upper quadrant pain: Secondary | ICD-10-CM

## 2017-12-10 MED ORDER — IOPAMIDOL (ISOVUE-300) INJECTION 61%
100.0000 mL | Freq: Once | INTRAVENOUS | Status: AC | PRN
  Administered 2017-12-10: 100 mL via INTRAVENOUS

## 2017-12-20 ENCOUNTER — Other Ambulatory Visit: Payer: Self-pay | Admitting: Physician Assistant

## 2018-03-17 ENCOUNTER — Encounter: Payer: Self-pay | Admitting: Physician Assistant

## 2018-03-17 IMAGING — US US ABDOMEN LIMITED
1 series · 14 of 25 positions shown · non-contrast
Comparison: January 02, 2016

CLINICAL DATA: Right upper quadrant pain

EXAM:
ULTRASOUND ABDOMEN LIMITED RIGHT UPPER QUADRANT

[Series 1: us abdomen limited · 0.22mm/px · 14 of 47 slices shown]
[im 1/47]
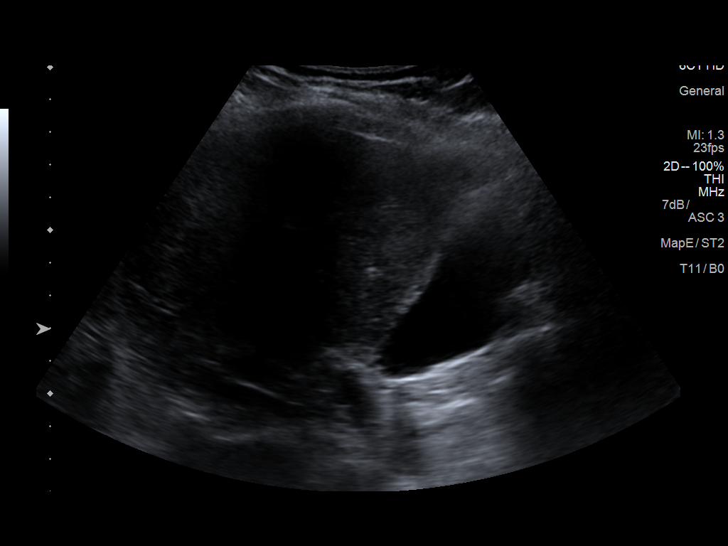
[im 4/47]
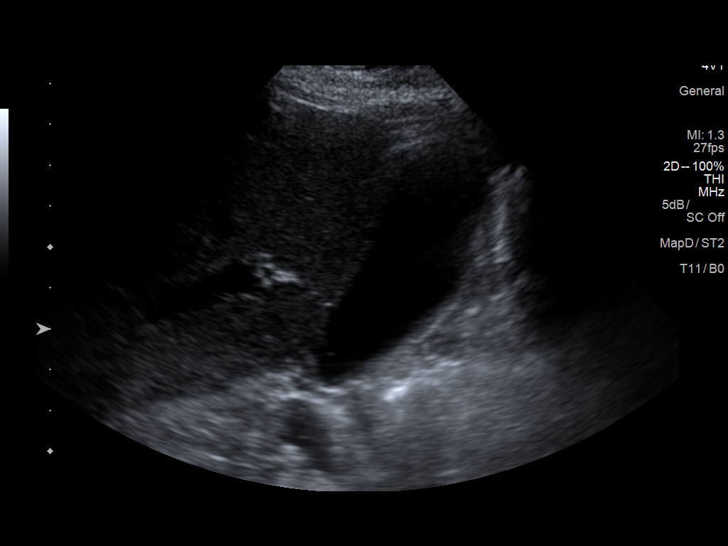
[im 8/47]
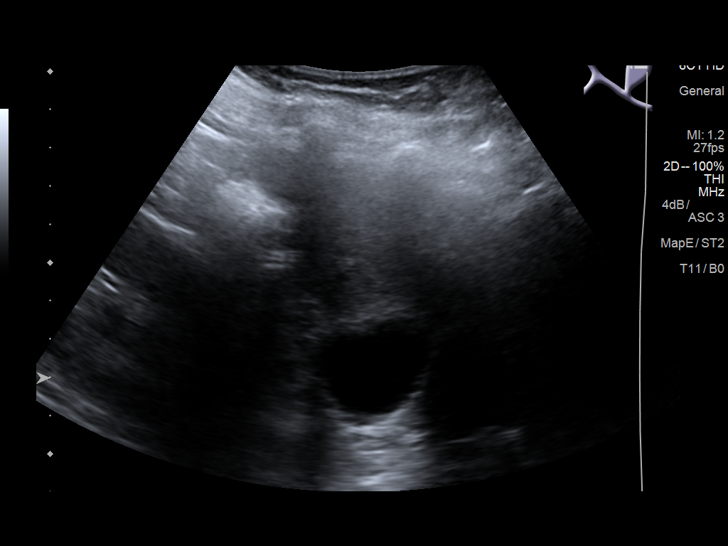
[im 12/47]
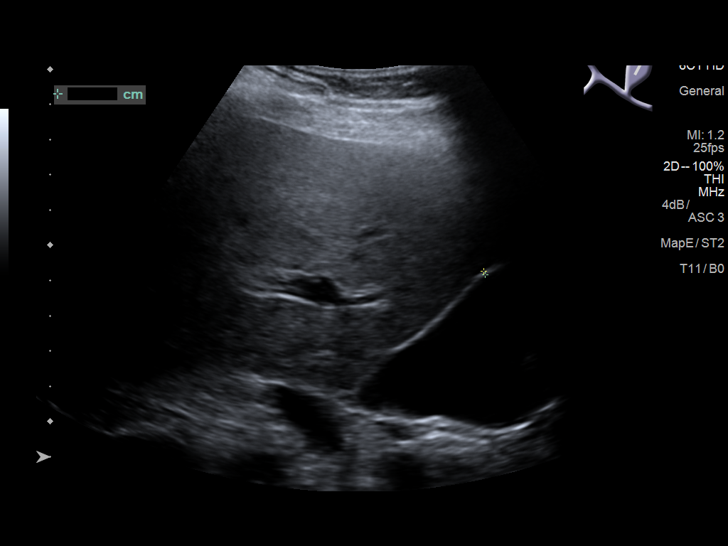
[im 16/47]
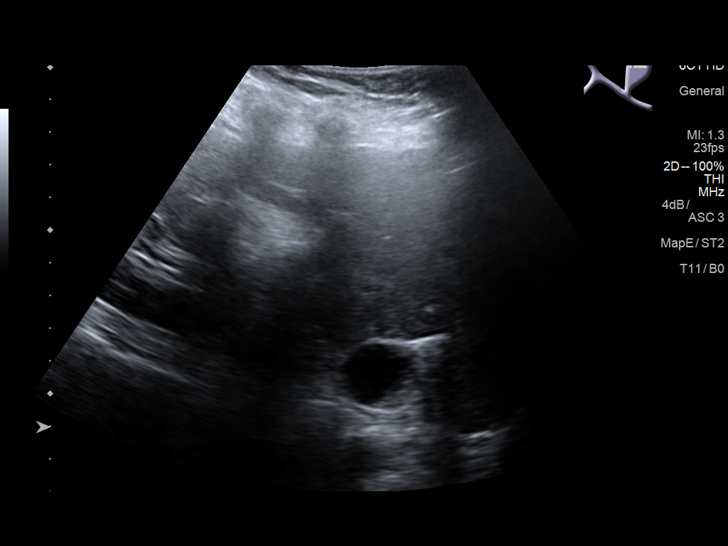
[im 18/47]
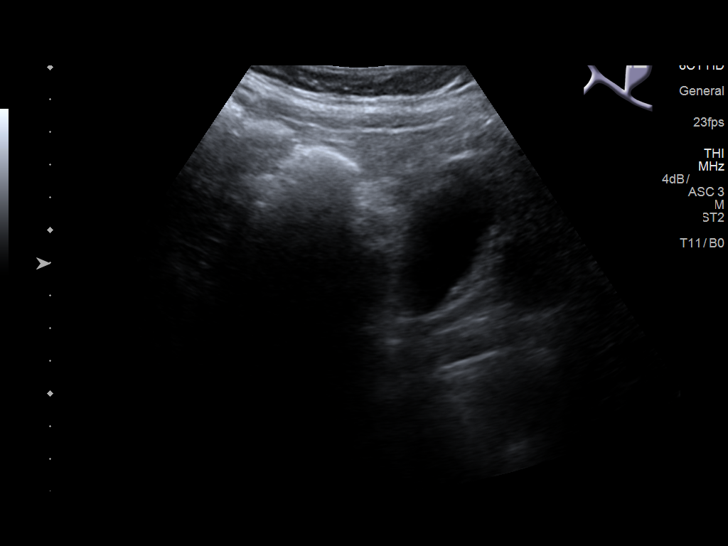
[im 22/47]
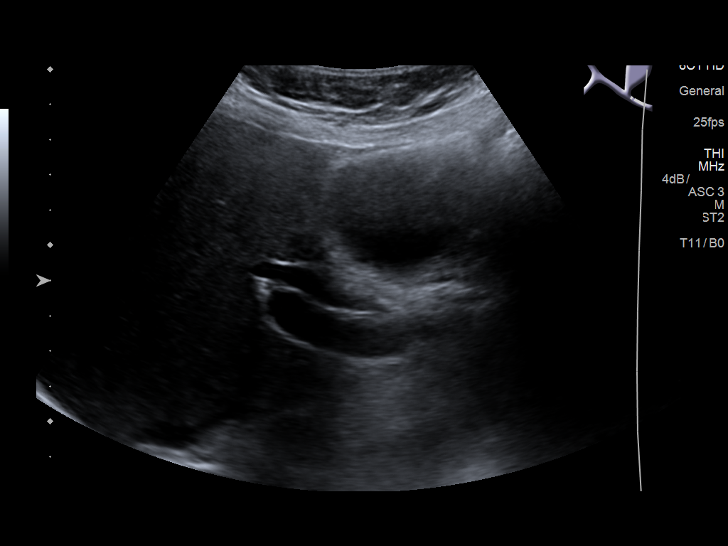
[im 25/47]
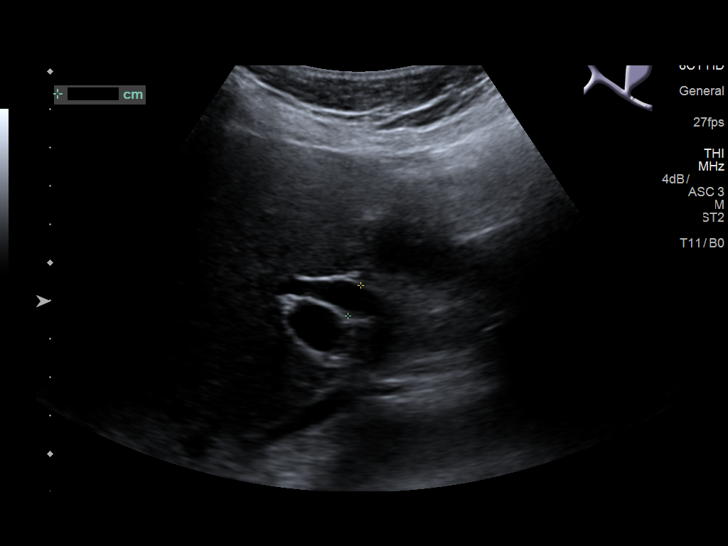
[im 29/47]
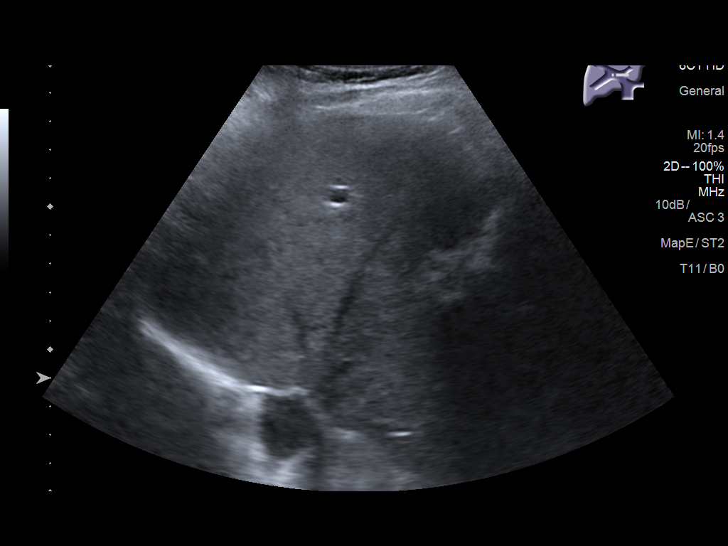
[im 31/47]
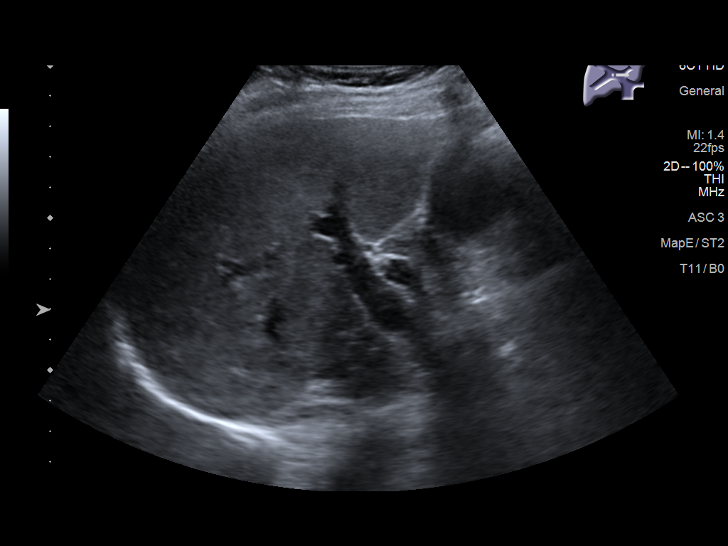
[im 35/47]
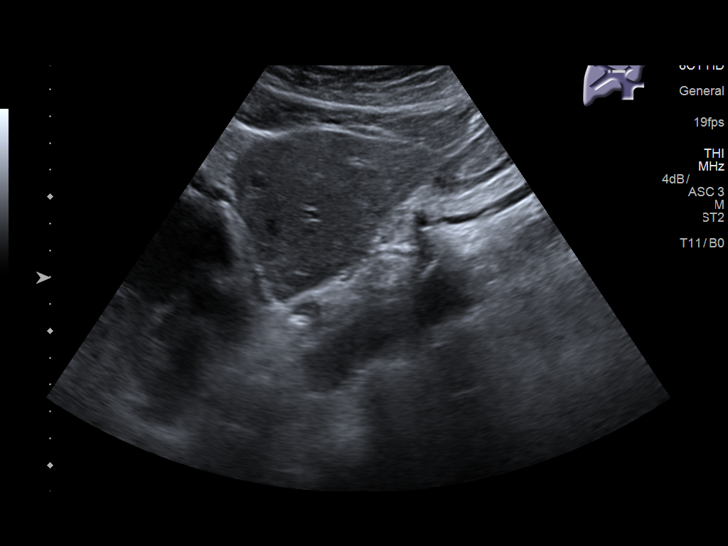
[im 39/47]
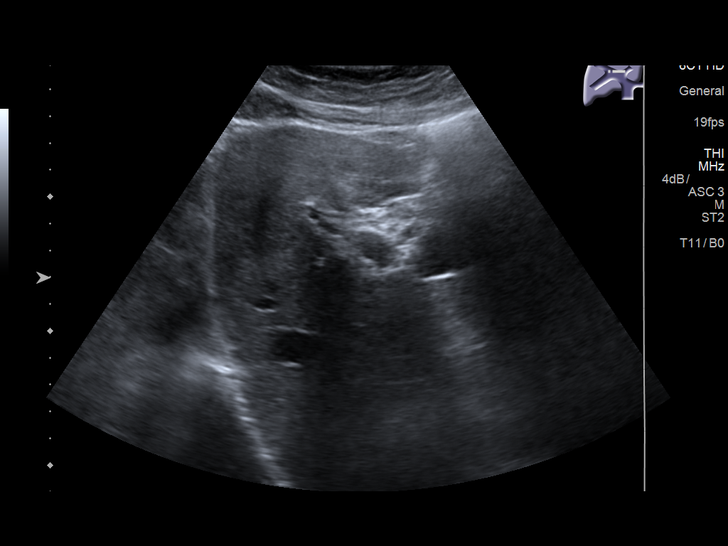
[im 43/47]
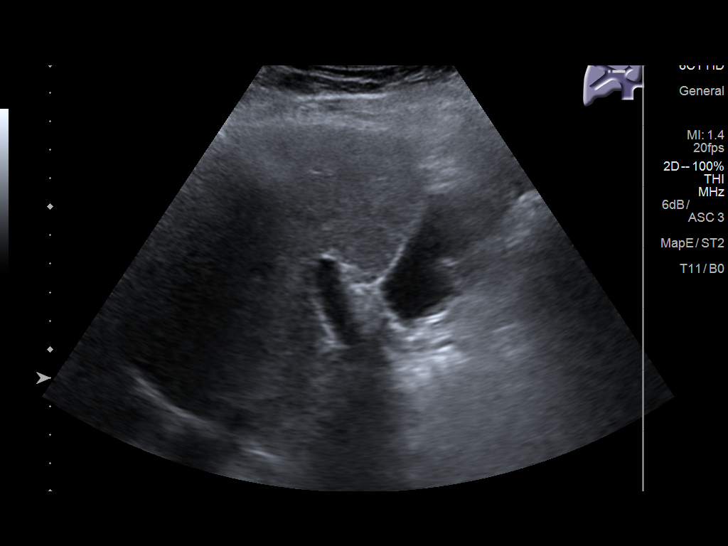
[im 47/47]
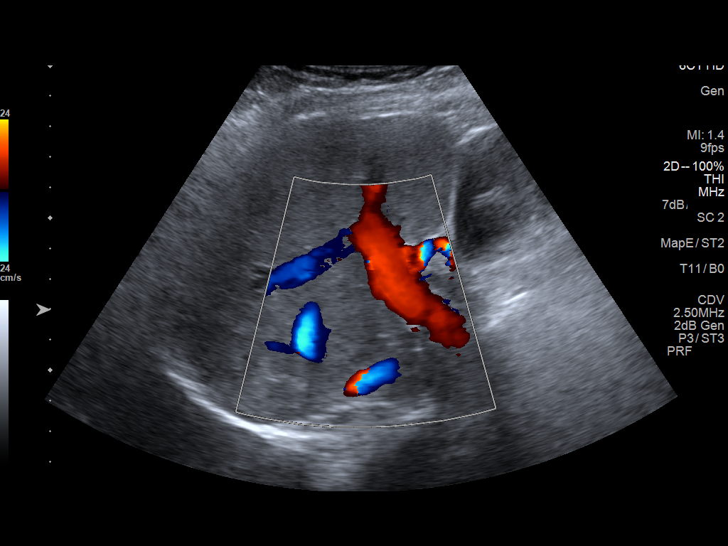

[14 of 25 positions shown; findings below may reference images not displayed]

FINDINGS: Gallbladder:

No gallstones or wall thickening visualized. There is no
pericholecystic fluid. No sonographic Murphy sign noted by
sonographer.

Common bile duct:

Diameter: 9 mm proximally, prominent. More distal common bile duct
obscured by gas. No mass or calculus seen in visualized portions of
common bile duct.

Liver:

No focal lesion identified. Within normal limits in parenchymal
echogenicity. Portal vein is patent on color Doppler imaging with
normal direction of blood flow towards the liver.
IMPRESSION: Prominence of the proximal common bile duct 10 9 mm. Distal common
bile duct obscured by gas. No mass or calculus evident. From an
imaging standpoint, MRCP would be the imaging study of choice to
assess for potential pathology in the distal common bile duct.

Gallbladder and liver appear normal.

## 2018-03-31 ENCOUNTER — Ambulatory Visit: Payer: Medicare Other

## 2018-03-31 DIAGNOSIS — R399 Unspecified symptoms and signs involving the genitourinary system: Secondary | ICD-10-CM | POA: Diagnosis not present

## 2018-03-31 NOTE — Addendum Note (Signed)
Addended by: Chancy Hurter on: 03/31/2018 09:59 AM   Modules accepted: Orders

## 2018-03-31 NOTE — Progress Notes (Signed)
Patient presents to the office for a nurse visit to leave a urine sample for symptoms of having a UTI. Patient states that she has lower abdominal/pelvic pressure and urgency to urinate. Patient states that she is currently taking Prednisone for her bowels and has been taking keflex 500mg  tablets that she had been prescribed in the past, on keflex for about 2 days. No dysuria or hematuria present.

## 2018-04-01 LAB — URINALYSIS, ROUTINE W REFLEX MICROSCOPIC
Bilirubin Urine: NEGATIVE
Glucose, UA: NEGATIVE
Hgb urine dipstick: NEGATIVE
Ketones, ur: NEGATIVE
Leukocytes, UA: NEGATIVE
Nitrite: NEGATIVE
Protein, ur: NEGATIVE
Specific Gravity, Urine: 1.012 (ref 1.001–1.03)
pH: 6.5 (ref 5.0–8.0)

## 2018-04-01 LAB — URINE CULTURE
MICRO NUMBER:: 90941060
Result:: NO GROWTH
SPECIMEN QUALITY:: ADEQUATE

## 2018-04-11 NOTE — Progress Notes (Signed)
MEDICARE ANNUAL WELLNESS VISIT AND CPE  Assessment:   Labile hypertension - continue medications, DASH diet, exercise and monitor at home. Call if greater than 130/80.  -     CBC with Differential/Platelet -     BASIC METABOLIC PANEL WITH GFR -     Hepatic function panel -     TSH  Neuropathy Improved  Hyperlipidemia, unspecified hyperlipidemia type -continue medications, check lipids, decrease fatty foods, increase activity.  -     Lipid panel  Prediabetes Discussed general issues about diabetes pathophysiology and management., Educational material distributed., Suggested low cholesterol diet., Encouraged aerobic exercise., Discussed foot care., Reminded to get yearly retinal exam. -     Hemoglobin A1c  History of renal cell cancer monitor  Osteoporosis, unspecified osteoporosis type, unspecified pathological fracture presence continue Vit D and Ca, weight bearing exercises  Hiatal hernia Continue PPI/H2 blocker, diet discussed  Gastroesophageal reflux disease with esophagitis Continue PPI/H2 blocker, diet discussed  Anxiety continue medications, stress management techniques discussed, increase water, good sleep hygiene discussed, increase exercise, and increase veggies.   Vitamin D deficiency  Memory changes Follows Neuro  Medication management -     Magnesium  Encounter for Medicare annual wellness exam   Over 40 minutes of exam, counseling, chart review and critical decision making was performed No future appointments.   Plan:   During the course of the visit the patient was educated and counseled about appropriate screening and preventive services including:    Pneumococcal vaccine   Prevnar 13  Influenza vaccine  Td vaccine  Screening electrocardiogram  Bone densitometry screening  Colorectal cancer screening  Diabetes screening  Glaucoma screening  Nutrition counseling   Advanced directives: requested   Subjective:  Kimberly Wilcox is a 72 y.o. female who presents for Medicare Annual Wellness Visit and complete physical.    Planning trip in [place] 14 days, June-July  She has been following GI for AB pain, had CT that showed soft tissue thickening at terminal ileum, started on prednisone but states it is not helping, will follow up for possible colonoscopy.   Her blood pressure has been controlled at home, today their BP is BP: (!) 142/86  She does workout, walking and states that her neuropathy has improved. She denies chest pain, shortness of breath, dizziness.  She is not on cholesterol medication and denies myalgias. Her cholesterol is at goal. The cholesterol last visit was:   Lab Results  Component Value Date   CHOL 214 (H) 09/24/2017   HDL 84 09/24/2017   LDLCALC 110 (H) 09/24/2017   TRIG 101 09/24/2017   CHOLHDL 2.5 09/24/2017  She has history of anxiety, can not tolerate SSRI, will take valium 1/2 pill as needed for anxiety, 1-2 days a week. Is now retired.   She has been working on diet and exercise for prediabetes, and denies paresthesia of the feet, polydipsia, polyuria and visual disturbances. Last A1C in the office was:  Lab Results  Component Value Date   HGBA1C 5.6 09/24/2017   Lab Results  Component Value Date   GFRNONAA 82 09/24/2017  Patient is on Vitamin D supplement.   Lab Results  Component Value Date   VD25OH 32 09/24/2017  BMI is Body mass index is 27.74 kg/m., she is working on diet and exercise. Wt Readings from Last 3 Encounters:  04/14/18 146 lb 12.8 oz (66.6 kg)  09/24/17 144 lb 9.6 oz (65.6 kg)  07/27/17 146 lb 12.8 oz (66.6 kg)  Medication Review: Current Outpatient Medications on File Prior to Visit  Medication Sig Dispense Refill  . B Complex-C (SUPER B COMPLEX PO) Take 1 tablet by mouth daily.     . Cholecalciferol (VITAMIN D) 2000 units tablet Take 1 tablet by mouth daily.    . diazepam (VALIUM) 2 MG tablet Take 1/2 to 1 tablet 2 to 3 x / day for acute  anxiety &  please try to limit to 5 days /week to avoid addiction 90 tablet 0  . fluticasone (FLONASE) 50 MCG/ACT nasal spray Place 2 sprays into both nostrils daily. 16 g 0  . latanoprost (XALATAN) 0.005 % ophthalmic solution 1 drop into both eyes once daily    . pantoprazole (PROTONIX) 40 MG tablet TAKE 1 TABLET BY MOUTH EVERY DAY 90 tablet 1  . predniSONE (DELTASONE) 20 MG tablet 1 tab 3 x day or as directed for TMJ 20 tablet 0  . nitroGLYCERIN (NITROSTAT) 0.4 MG SL tablet Place 1 tablet (0.4 mg total) under the tongue every 5 (five) minutes as needed for chest pain. 25 tablet 4   No current facility-administered medications on file prior to visit.     Allergies  Allergen Reactions  . Meloxicam Itching  . Nitrofurantoin Monohyd Macro     "flu like" symptoms   . Sulfa Antibiotics Hives    Current Problems (verified) Patient Active Problem List   Diagnosis Date Noted  . Atherosclerosis of aorta ([place]) 04/14/2018  . Neuropathy 01/09/2015  . Memory changes 01/09/2015  . Hyperlipidemia 10/29/2014  . Prediabetes 10/29/2014  . History of renal cell cancer 10/29/2014  . Labile hypertension 10/29/2014  . Osteoporosis 10/29/2014  . Vitamin D deficiency   . GERD (gastroesophageal reflux disease)   . Anxiety   . Hiatal hernia     Screening Tests Immunization History  Administered Date(s) Administered  . Influenza, High Dose Seasonal PF 06/22/2017  . Influenza, Seasonal, Injecte, Preservative Fre 07/28/2016  . Pneumococcal Conjugate-13 09/17/2015  . Pneumococcal Polysaccharide-23 10/04/2012  . Td 10/04/2009   Preventative care: Last colonoscopy:01/2016, Dr. Amedeo Plenty EGD 2017 Last mammogram: 07/2017 Last pap smear/pelvic exam: 2011  remote DEXA: 10/2014 MRI brain 10/2014 Ct AB 27/5170 US carotids LICA <0174 [place] AB 9449 CXR 2017  Prior vaccinations: TD or Tdap: 2011  Influenza: 2018 Pneumococcal: 2014 Prevnar13: 2017 Shingles/Zostavax: declines  Names of Other  Physician/Practitioners you currently use: 1. [facility] here for primary care 2. Dr. Katy Fitch, eye doctor, last visit April 2018 3. Gwyndolyn Saxon, dentist, last visit 2019 Patient Care Team: Unk Pinto, MD as PCP - General (Internal Medicine) Warden Fillers, MD as Consulting Physician (Optometry) Josue Hector, MD as Consulting Physician (Cardiology) Alexis Frock, MD as Consulting Physician (Urology) Teena Irani, MD (Inactive) as Consulting Physician (Gastroenterology)  SURGICAL HISTORY She  has a past surgical history that includes Nephrectomy (Left, 1999); Left oophorectomy (1985); and Tubal ligation. FAMILY HISTORY Her family history includes Other in her brother; Pneumonia in her father. SOCIAL HISTORY She  reports that she quit smoking about 33 years ago. She has never used smokeless tobacco. She reports that she drinks alcohol. She reports that she does not use drugs.   MEDICARE WELLNESS OBJECTIVES: Physical activity: Current Exercise Habits: Home exercise routine, Type of exercise: walking, Time (Minutes): 30, Frequency (Times/Week): 5, Weekly Exercise (Minutes/Week): 150 Cardiac risk factors: Cardiac Risk Factors include: advanced age (>38men, >47 women);hypertension;sedentary lifestyle;dyslipidemia Depression/mood screen:   Depression screen [facility] 2/9 04/14/2018  Decreased Interest 0  Down,  Depressed, Hopeless 0  PHQ - 2 Score 0    ADLs:  In your present state of health, do you have any difficulty performing the following activities: 04/14/2018 09/25/2017  Hearing? N N  Vision? N N  Difficulty concentrating or making decisions? N N  Walking or climbing stairs? N N  Dressing or bathing? N N  Doing errands, shopping? N N  Some recent data might be hidden     Cognitive Testing  Alert? Yes  Normal Appearance?Yes  Oriented to person? Yes  Place? Yes   Time? Yes  Recall of three objects?  Yes  Can perform simple calculations?  Yes  Displays appropriate judgment?Yes  Can read the correct time from a watch face?Yes  EOL planning: Does Patient Have a Medical Advance Directive?: Yes Type of Advance Directive: Healthcare Power of Attorney, Living will Copy of [place] in Chart?: No - copy requested  Review of Systems  Constitutional: Negative.   HENT: Negative.   Eyes: Negative for blurred vision, double vision, photophobia, pain, discharge and redness.  Respiratory: Negative.   Cardiovascular: Negative.   Gastrointestinal: Positive for abdominal pain. Negative for blood in stool, constipation, diarrhea, heartburn, melena, nausea and vomiting.  Genitourinary: Positive for frequency. Negative for dysuria, flank pain, hematuria and urgency.  Musculoskeletal: Negative.   Skin: Negative.   Neurological: Positive for sensory change (bilateral feet). Negative for dizziness, tingling, tremors, speech change, focal weakness, seizures and loss of consciousness.  Psychiatric/Behavioral: Negative for depression, hallucinations, memory loss, substance abuse and suicidal ideas. The patient is not nervous/anxious and does not have insomnia.      Objective:     Today's Vitals   04/14/18 1008  BP: (!) 142/86  Pulse: 65  Resp: 14  Temp: 98.5 F (36.9 C)  SpO2: 98%  Weight: 146 lb 12.8 oz (66.6 kg)  Height: 5\' 1"  (1.549 m)  PainSc: 2    Body mass index is 27.74 kg/m.  General appearance: alert, no distress, WD/WN, female HEENT: normocephalic, sclerae anicteric, TMs pearly, nares patent, no discharge or erythema, pharynx normal Oral cavity: MMM, no lesions Neck: supple, no lymphadenopathy, no thyromegaly, no masses Heart: RRR, normal S1, S2, no murmurs Lungs: CTA bilaterally, no wheezes, rhonchi, or rales Abdomen: +bs, soft, non tender, non distended, no masses, no hepatomegaly, no splenomegaly Musculoskeletal: nontender, no swelling, no obvious deformity Extremities: no edema, no cyanosis, no  clubbing Pulses: 2+ symmetric, upper and lower extremities, normal cap refill Neurological: alert, oriented x 3, CN2-12 intact, strength normal upper extremities and lower extremities, sensation decreased bilateral feet, DTRs 2+ throughout, no cerebellar signs, gait normal Psychiatric: normal affect, behavior normal, pleasant  GYN: + cystocele, no masses appreciated, + vaginal atrophy.   Medicare Attestation I have personally reviewed: The patient's medical and social history Their use of alcohol, tobacco or illicit drugs Their current medications and supplements The patient's functional ability including ADLs,fall risks, home safety risks, cognitive, and hearing and visual impairment Diet and physical activities Evidence for depression or mood disorders  The patient's weight, height, BMI, and visual acuity have been recorded in the chart.  I have made referrals, counseling, and provided education to the patient based on review of the above and I have provided the patient with a written personalized care plan for preventive services.     Vicie Mutters, PA-C   04/14/2018

## 2018-04-11 NOTE — Patient Instructions (Addendum)
Kimberly Wilcox , Thank you for taking time to come for your Medicare Wellness Visit. I appreciate your ongoing commitment to your health goals. Please review the following plan we discussed and let me know if I can assist you in the future.   These are the goals we discussed: Goals    . Blood Pressure < 140/90    . Exercise 150 min/wk Moderate Activity        This is a list of the screening recommended for you and due dates:  Health Maintenance  Topic Date Due  . Flu Shot  03/24/2018  . Mammogram  07/29/2019  . Tetanus Vaccine  10/05/2019  . Colon Cancer Screening  02/26/2026  . DEXA scan (bone density measurement)  Completed  .  Hepatitis C: One time screening is recommended by Center for Disease Control  (CDC) for  adults born from [birth year] through 1966.   Completed  . Pneumonia vaccines  Completed    If you have been in the ER, hospital, or long term care facility, we want to see you within one week of discharge so please CONTACT OUR OFFICE at [phone].  We will try to contact you if we know about your admission/visit but we would love for you to contact us first.  We know your history and want to make sure all of your questions are answered and needs are taken care of after admission.   We also want our patients to know that we do NOT approve of [facility] soliciting our patients to do home visits and we do NOT approve of [facility] screenings. Please contact us before doing either of these "services".   Being a woman you may not have the typical symptoms of a heart attack.  You may not have any pain OR you may have atypical pain such as jaw pain, upper back pain, arm pain, "my bra feels to tight" and you will often have symptoms with it like below.  Symptoms for a heart attack will likely occur when you exert your self or exercise and include: Shortness of breath Sweating Nausea Dizziness Fast or irregular heart beats Fatigue   It makes me feel better if my patients get  their heart rate up with exercise once or twice a week and pay close attention to your body. If there is ANY change in your exercise capacity or if you have symptoms above, please STOP and call 911 or call to come to the office.   Here is some information to help you keep your heart healthy: Move it! - Aim for 30 mins of activity every day. Take it slowly at first. Talk to [place] before starting any new exercise program.   Lose it.  -Body Mass Index (BMI) can indicate if you need to lose weight. A healthy range is 18.5-24.9. For a BMI calculator, go to [employer]  Waist Management -Excess abdominal fat is a risk factor for heart disease, diabetes, asthma, stroke and more. Ideal waist circumference is less than 35" for women and less than 40" for men.   Eat Right -focus on fruits, vegetables, whole grains, and meals you make yourself. Avoid foods with trans fat and high sugar/sodium content.   Snooze or Snore? - Loud snoring can be a sign of sleep apnea, a significant risk factor for high blood pressure, heart attach, stroke, and heart arrhythmias.  Kick the habit -Quit Smoking! Avoid second hand smoke. A single cigarette raises your blood pressure for 20 mins and increases the  risk of heart attack and stroke for the next 24 hours.   Are Aspirin and Supplements right for you? -Add ENTERIC COATED low dose 81 mg Aspirin daily OR can do every other day if you have easy bruising to protect your heart and head. As well as to reduce risk of Colon Cancer by 20 %, Skin Cancer by 26 % , Melanoma by 46% and Pancreatic cancer by 60%  Say "No to Stress -There may be little you can do about problems that cause stress. However, techniques such as long walks, meditation, and exercise can help you manage it.   Start Now! - Make changes one at a time and set reasonable goals to increase your likelihood of success.      If you are traveling you can take these medications to be more prepared. If you get  chest pain, shortness of breath or abdominal pain please go to the hospital wherever you may be.   Ciprofloxacin is good for travelers diarrhea, you can take 2 pills a day for 7 days. Or it is also good for urinary tract infections, you can take 2 a day for 7 days.  Zpak- is good for sinus infections please finish as prescribed Phenergran is for nausea but it sedating so plan on eating and sleeping.  Levsin is good for nausea, diarrhea, or abdominal cramping- it can constipate you so don't take too much. This dissolves under your tongue.  Prednisone is good for joint pain or rashes or spider bites- you can take it as prescribed but if you are feeling better you can stop it early.  Diflucan is for a yeast infection.    Make sure you clear your search history and turn off cookies or use different computer to book because sites will bump up price if they know you have been looking

## 2018-04-14 ENCOUNTER — Encounter: Payer: Self-pay | Admitting: Physician Assistant

## 2018-04-14 ENCOUNTER — Ambulatory Visit: Payer: Medicare Other | Admitting: Physician Assistant

## 2018-04-14 VITALS — BP 142/86 | HR 65 | Temp 98.5°F | Resp 14 | Ht 61.0 in | Wt 146.8 lb

## 2018-04-14 DIAGNOSIS — Z0000 Encounter for general adult medical examination without abnormal findings: Secondary | ICD-10-CM

## 2018-04-14 DIAGNOSIS — M810 Age-related osteoporosis without current pathological fracture: Secondary | ICD-10-CM

## 2018-04-14 DIAGNOSIS — I700 Atherosclerosis of aorta: Secondary | ICD-10-CM

## 2018-04-14 DIAGNOSIS — F419 Anxiety disorder, unspecified: Secondary | ICD-10-CM

## 2018-04-14 DIAGNOSIS — Z0001 Encounter for general adult medical examination with abnormal findings: Secondary | ICD-10-CM | POA: Diagnosis not present

## 2018-04-14 DIAGNOSIS — Z85528 Personal history of other malignant neoplasm of kidney: Secondary | ICD-10-CM

## 2018-04-14 DIAGNOSIS — R413 Other amnesia: Secondary | ICD-10-CM

## 2018-04-14 DIAGNOSIS — K2100 Gastro-esophageal reflux disease with esophagitis, without bleeding: Secondary | ICD-10-CM

## 2018-04-14 DIAGNOSIS — E785 Hyperlipidemia, unspecified: Secondary | ICD-10-CM

## 2018-04-14 DIAGNOSIS — G629 Polyneuropathy, unspecified: Secondary | ICD-10-CM | POA: Diagnosis not present

## 2018-04-14 DIAGNOSIS — E559 Vitamin D deficiency, unspecified: Secondary | ICD-10-CM

## 2018-04-14 DIAGNOSIS — Z79899 Other long term (current) drug therapy: Secondary | ICD-10-CM

## 2018-04-14 DIAGNOSIS — Z6827 Body mass index (BMI) 27.0-27.9, adult: Secondary | ICD-10-CM

## 2018-04-14 DIAGNOSIS — R6889 Other general symptoms and signs: Secondary | ICD-10-CM | POA: Diagnosis not present

## 2018-04-14 DIAGNOSIS — K449 Diaphragmatic hernia without obstruction or gangrene: Secondary | ICD-10-CM

## 2018-04-14 DIAGNOSIS — R7303 Prediabetes: Secondary | ICD-10-CM

## 2018-04-14 DIAGNOSIS — R0989 Other specified symptoms and signs involving the circulatory and respiratory systems: Secondary | ICD-10-CM

## 2018-04-15 LAB — CBC WITH DIFFERENTIAL/PLATELET
Basophils Absolute: 31 cells/uL (ref 0–200)
Basophils Relative: 0.4 %
Eosinophils Absolute: 78 cells/uL (ref 15–500)
Eosinophils Relative: 1 %
HCT: 42.7 % (ref 35.0–45.0)
Hemoglobin: 14.1 g/dL (ref 11.7–15.5)
Lymphs Abs: 3198 cells/uL (ref 850–3900)
MCH: 27.3 pg (ref 27.0–33.0)
MCHC: 33 g/dL (ref 32.0–36.0)
MCV: 82.6 fL (ref 80.0–100.0)
MPV: 10 fL (ref 7.5–12.5)
Monocytes Relative: 7.4 %
Neutro Abs: 3916 cells/uL (ref 1500–7800)
Neutrophils Relative %: 50.2 %
Platelets: 295 10*3/uL (ref 140–400)
RBC: 5.17 10*6/uL — ABNORMAL HIGH (ref 3.80–5.10)
RDW: 13.5 % (ref 11.0–15.0)
Total Lymphocyte: 41 %
WBC mixed population: 577 cells/uL (ref 200–950)
WBC: 7.8 10*3/uL (ref 3.8–10.8)

## 2018-04-15 LAB — COMPLETE METABOLIC PANEL WITH GFR
AG Ratio: 1.6 (calc) (ref 1.0–2.5)
ALT: 7 U/L (ref 6–29)
AST: 17 U/L (ref 10–35)
Albumin: 4.3 g/dL (ref 3.6–5.1)
Alkaline phosphatase (APISO): 47 U/L (ref 33–130)
BUN/Creatinine Ratio: 15 (calc) (ref 6–22)
BUN: 15 mg/dL (ref 7–25)
CO2: 28 mmol/L (ref 20–32)
Calcium: 10.1 mg/dL (ref 8.6–10.4)
Chloride: 101 mmol/L (ref 98–110)
Creat: 1 mg/dL — ABNORMAL HIGH (ref 0.60–0.93)
GFR, Est African American: 65 mL/min/{1.73_m2} (ref >=60)
GFR, Est Non African American: 56 mL/min/{1.73_m2} — ABNORMAL LOW (ref >=60)
Globulin: 2.7 g/dL (calc) (ref 1.9–3.7)
Glucose, Bld: 88 mg/dL (ref 65–99)
Potassium: 4.2 mmol/L (ref 3.5–5.3)
Sodium: 140 mmol/L (ref 135–146)
Total Bilirubin: 0.5 mg/dL (ref 0.2–1.2)
Total Protein: 7 g/dL (ref 6.1–8.1)

## 2018-04-15 LAB — LIPID PANEL
Cholesterol: 239 mg/dL — ABNORMAL HIGH (ref <200)
HDL: 94 mg/dL (ref >50)
LDL Cholesterol (Calc): 122 mg/dL (calc) — ABNORMAL HIGH
Non-HDL Cholesterol (Calc): 145 mg/dL (calc) — ABNORMAL HIGH (ref <130)
Total CHOL/HDL Ratio: 2.5 (calc) (ref <5.0)
Triglycerides: 118 mg/dL (ref <150)

## 2018-04-15 LAB — HEMOGLOBIN A1C
Hgb A1c MFr Bld: 5.8 % of total Hgb — ABNORMAL HIGH (ref <5.7)
Mean Plasma Glucose: 120 (calc)
eAG (mmol/L): 6.6 (calc)

## 2018-04-15 LAB — MAGNESIUM: Magnesium: 1.9 mg/dL (ref 1.5–2.5)

## 2018-04-15 LAB — TSH: TSH: 2.52 mIU/L (ref 0.40–4.50)

## 2018-04-15 NOTE — Addendum Note (Signed)
Addended by: Vicie Mutters R on: 04/15/2018 06:08 AM   Modules accepted: Orders

## 2018-05-19 ENCOUNTER — Other Ambulatory Visit: Payer: Medicare Other

## 2018-05-19 DIAGNOSIS — R0989 Other specified symptoms and signs involving the circulatory and respiratory systems: Secondary | ICD-10-CM

## 2018-05-19 DIAGNOSIS — Z85528 Personal history of other malignant neoplasm of kidney: Secondary | ICD-10-CM

## 2018-05-19 LAB — BASIC METABOLIC PANEL WITH GFR
BUN: 13 mg/dL (ref 7–25)
CO2: 29 mmol/L (ref 20–32)
Calcium: 9.8 mg/dL (ref 8.6–10.4)
Chloride: 104 mmol/L (ref 98–110)
Creat: 0.83 mg/dL (ref 0.60–0.93)
GFR, Est African American: 81 mL/min/{1.73_m2} (ref >=60)
GFR, Est Non African American: 70 mL/min/{1.73_m2} (ref >=60)
Glucose, Bld: 91 mg/dL (ref 65–99)
Potassium: 4.5 mmol/L (ref 3.5–5.3)
Sodium: 140 mmol/L (ref 135–146)

## 2018-06-14 ENCOUNTER — Encounter: Payer: Self-pay | Admitting: Adult Health

## 2018-06-14 ENCOUNTER — Ambulatory Visit: Payer: Medicare Other | Admitting: Adult Health

## 2018-06-14 VITALS — BP 134/80 | HR 66 | Temp 97.5°F | Ht 61.0 in | Wt 149.0 lb

## 2018-06-14 DIAGNOSIS — R072 Precordial pain: Secondary | ICD-10-CM

## 2018-06-14 DIAGNOSIS — Z23 Encounter for immunization: Secondary | ICD-10-CM

## 2018-06-14 DIAGNOSIS — R0602 Shortness of breath: Secondary | ICD-10-CM | POA: Diagnosis not present

## 2018-06-14 DIAGNOSIS — R05 Cough: Secondary | ICD-10-CM

## 2018-06-14 DIAGNOSIS — M26622 Arthralgia of left temporomandibular joint: Secondary | ICD-10-CM | POA: Diagnosis not present

## 2018-06-14 DIAGNOSIS — R059 Cough, unspecified: Secondary | ICD-10-CM

## 2018-06-14 LAB — TROPONIN I: Troponin I: <0.01 ng/mL (ref <=0.0)

## 2018-06-14 MED ORDER — PROMETHAZINE-DM 6.25-15 MG/5ML PO SYRP: 5.0000 mL | ORAL_SOLUTION | Freq: Four times a day (QID) | ORAL | 1 refills | Status: DC | PRN

## 2018-06-14 MED ORDER — NITROGLYCERIN 0.4 MG SL SUBL: 0.4000 mg | SUBLINGUAL_TABLET | SUBLINGUAL | 4 refills | Status: AC | PRN

## 2018-06-14 MED ORDER — PREDNISONE 20 MG PO TABS: ORAL_TABLET | ORAL | 0 refills | Status: DC

## 2018-06-14 MED ORDER — PANTOPRAZOLE SODIUM 40 MG PO TBEC: 40.0000 mg | DELAYED_RELEASE_TABLET | Freq: Every day | ORAL | 1 refills | Status: DC

## 2018-06-14 MED ORDER — DIAZEPAM 2 MG PO TABS: ORAL_TABLET | ORAL | 0 refills | Status: DC

## 2018-06-14 NOTE — Progress Notes (Signed)
Assessment and Plan:  Kimberly Wilcox was seen today for cough.  Diagnoses and all orders for this visit:  Chest pain, precordial/Shortness of breath Vague symptoms, unclear etiology, questionable cardiac history, will r/o MI Will refill nitro, she will try when she gets home, call if this improves pain, will send to ED Go to the ER if any chest pain, shortness of breath, nausea, dizziness, severe HA, changes vision/speech -     EKG 12-Lead -     Troponin I  Need for immunization against influenza -     Flu vaccine HIGH DOSE PF  Cough -     pantoprazole (PROTONIX) 40 MG tablet; Take 1 tablet (40 mg total) by mouth daily. -     predniSONE (DELTASONE) 20 MG tablet; 1 tab 3 x day or as directed for TMJ -     promethazine-dextromethorphan (PROMETHAZINE-DM) 6.25-15 MG/5ML syrup; Take 5 mLs by mouth 4 (four) times daily as needed for cough.  Other orders -     diazepam (VALIUM) 2 MG tablet; Take 1/2 to 1 tablet 2 to 3 x / day for acute anxiety &  please try to limit to 5 days /week to avoid addiction -     nitroGLYCERIN (NITROSTAT) 0.4 MG SL tablet; Place 1 tablet (0.4 mg total) under the tongue every 5 (five) minutes as needed for chest pain.  Further disposition pending results of labs. Discussed med's effects and SE's.   Over 30 minutes of exam, counseling, chart review, and critical decision making was performed.   Future Appointments  Date Time Provider [place]  11/09/2018 10:00 AM Vicie Mutters, PA-C [facility] None  04/27/2019 10:00 AM Vicie Mutters, PA-C [facility] None    ------------------------------------------------------------------------------------------------------------------   HPI BP 134/80   Pulse 66   Temp (!) 97.5 F (36.4 C)   Ht 5\' 1"  (1.549 m)   Wt 149 lb (67.6 kg)   SpO2 99%   BMI 28.15 kg/m   72 y.o.female presents for chest soreness 4 days when she started coughing, experienced some shortness of breath with talking, started having sore  throat, dry hacky cough, nasal congestion, post nasal drips. She denies fever/chills, She does endorse seasonal allergies, She has taken nyquil for her symptoms but is unsure if this helped.   She reports symptoms have improved significantly over the weekend. Reports chest pain/pressure is midline, constant, achy. She denies pain worse with deep breaths, ROM.    She has seen Dr. Johnsie Cancel for chest pain and prescribed nitro but has been out and hasn't tried. Last seen 2017, had cath with Dr. Aundra Dubin at unknown date and was unremarkable per Dr. Kyla Balzarine note.   Past Medical History:  Diagnosis Date  . Anxiety   . Chest discomfort   . GERD (gastroesophageal reflux disease)   . Hiatal hernia   . Hypertension    labile, normal cath 2013  . Osteoporosis   . Renal cell cancer ([place]) 1999   LEFT NEPHRECTOMY  . Vitamin D deficiency      Allergies  Allergen Reactions  . Meloxicam Itching  . Nitrofurantoin Monohyd Macro     "flu like" symptoms   . Sulfa Antibiotics Hives    Current Outpatient Medications on File Prior to Visit  Medication Sig  . B Complex-C (SUPER B COMPLEX PO) Take 1 tablet by mouth daily.   . Cholecalciferol (VITAMIN D) 2000 units tablet Take 1 tablet by mouth daily.  . diazepam (VALIUM) 2 MG tablet Take 1/2 to 1 tablet 2 to  3 x / day for acute anxiety &  please try to limit to 5 days /week to avoid addiction  . latanoprost (XALATAN) 0.005 % ophthalmic solution 1 drop into both eyes once daily  . nitroGLYCERIN (NITROSTAT) 0.4 MG SL tablet Place 1 tablet (0.4 mg total) under the tongue every 5 (five) minutes as needed for chest pain.  . pantoprazole (PROTONIX) 40 MG tablet TAKE 1 TABLET BY MOUTH EVERY DAY  . fluticasone (FLONASE) 50 MCG/ACT nasal spray Place 2 sprays into both nostrils daily. (Patient not taking: Reported on 06/14/2018)  . predniSONE (DELTASONE) 20 MG tablet 1 tab 3 x day or as directed for TMJ   No current facility-administered medications on file prior to  visit.     ROS: all negative except above.   Physical Exam:  BP 134/80   Pulse 66   Temp (!) 97.5 F (36.4 C)   Ht 5\' 1"  (1.549 m)   Wt 149 lb (67.6 kg)   SpO2 99%   BMI 28.15 kg/m   General Appearance: Well nourished, in no apparent distress. Eyes: PERRLA, EOMs, conjunctiva no swelling or erythema Sinuses: No Frontal/maxillary tenderness ENT/Mouth: Ext aud canals clear, TMs without erythema, bulging. No erythema, swelling, or exudate on post pharynx.  Tonsils not swollen or erythematous. Hearing normal.  Neck: Supple, thyroid normal.  Respiratory: Respiratory effort normal, BS equal bilaterally without rales, wheezing or stridor. She does have scant rhonchi in right upper bronchial area.  Cardio: RRR with no MRGs. Brisk peripheral pulses without edema.  Abdomen: Soft, + BS.  Non tender, no guarding, rebound, hernias, masses. Lymphatics: Non tender without lymphadenopathy.  Musculoskeletal: normal gait, not notably tender over sternum, ribs, junctions  Skin: Warm, dry without rashes, lesions, ecchymosis.  Neuro: Normal muscle tone, Sensation intact.  Psych: Awake and oriented X 3, normal affect, Insight and Judgment appropriate.     Izora Ribas, NP 9:14 AM [facility]

## 2018-06-14 NOTE — Patient Instructions (Addendum)
Try nitroglycerine when you get home  Anxiety medication if you feel anxious  Restart acid reflux medication   I've sent in prednisone and cough medication to start if all labs come back negative  Go to the ER if any chest pain, shortness of breath, nausea, dizziness, severe HA, changes vision/speech       Nonspecific Chest Pain Chest pain can be caused by many different conditions. There is always a chance that your pain could be related to something serious, such as a heart attack or a blood clot in your lungs. Chest pain can also be caused by conditions that are not life-threatening. If you have chest pain, it is very important to follow up with your health care provider. What are the causes? Causes of this condition include:  Heartburn.  Pneumonia or bronchitis.  Anxiety or stress.  Inflammation around your heart (pericarditis) or lung (pleuritis or pleurisy).  A blood clot in your lung.  A collapsed lung (pneumothorax). This can develop suddenly on its own (spontaneous pneumothorax) or from trauma to the chest.  Shingles infection (varicella-zoster virus).  Heart attack.  Damage to the bones, muscles, and cartilage that make up your chest wall. This can include: ? Bruised bones due to injury. ? Strained muscles or cartilage due to frequent or repeated coughing or overwork. ? Fracture to one or more ribs. ? Sore cartilage due to inflammation (costochondritis).  What increases the risk? Risk factors for this condition may include:  Activities that increase your risk for trauma or injury to your chest.  Respiratory infections or conditions that cause frequent coughing.  Medical conditions or overeating that can cause heartburn.  Heart disease or family history of heart disease.  Conditions or health behaviors that increase your risk of developing a blood clot.  Having had chicken pox (varicella zoster).  What are the signs or symptoms? Chest pain can feel  like:  Burning or tingling on the surface of your chest or deep in your chest.  Crushing, pressure, aching, or squeezing pain.  Dull or sharp pain that is worse when you move, cough, or take a deep breath.  Pain that is also felt in your back, neck, shoulder, or arm, or pain that spreads to any of these areas.  Your chest pain may come and go, or it may stay constant. How is this diagnosed? Lab tests or other studies may be needed to find the cause of your pain. Your health care provider may have you take a test called an ECG (electrocardiogram). An ECG records your heartbeat patterns at the time the test is performed. You may also have other tests, such as:  Transthoracic echocardiogram (TTE). In this test, sound waves are used to create a picture of the heart structures and to look at how blood flows through your heart.  Transesophageal echocardiogram (TEE).This is a more advanced imaging test that takes images from inside your body. It allows your health care provider to see your heart in finer detail.  Cardiac monitoring. This allows your health care provider to monitor your heart rate and rhythm in real time.  Holter monitor. This is a portable device that records your heartbeat and can help to diagnose abnormal heartbeats. It allows your health care provider to track your heart activity for several days, if needed.  Stress tests. These can be done through exercise or by taking medicine that makes your heart beat more quickly.  Blood tests.  Other imaging tests.  How is this treated?  Treatment depends on what is causing your chest pain. Treatment may include:  Medicines. These may include: ? Acid blockers for heartburn. ? Anti-inflammatory medicine. ? Pain medicine for inflammatory conditions. ? Antibiotic medicine, if an infection is present. ? Medicines to dissolve blood clots. ? Medicines to treat coronary artery disease (CAD).  Supportive care for conditions that do  not require medicines. This may include: ? Resting. ? Applying heat or cold packs to injured areas. ? Limiting activities until pain decreases.  Follow these instructions at home: Medicines  If you were prescribed an antibiotic, take it as told by your health care provider. Do not stop taking the antibiotic even if you start to feel better.  Take over-the-counter and prescription medicines only as told by your health care provider. Lifestyle  Do not use any products that contain nicotine or tobacco, such as cigarettes and e-cigarettes. If you need help quitting, ask your health care provider.  Do not drink alcohol.  Make lifestyle changes as directed by your health care provider. These may include: ? Getting regular exercise. Ask your health care provider to suggest some activities that are safe for you. ? Eating a heart-healthy diet. A registered dietitian can help you to learn healthy eating options. ? Maintaining a healthy weight. ? Managing diabetes, if necessary. ? Reducing stress, such as with yoga or relaxation techniques. General instructions  Avoid any activities that bring on chest pain.  If heartburn is the cause for your chest pain, raise (elevate) the head of your bed about 6 inches (15 cm) by putting blocks under the legs. Sleeping with more pillows does not effectively relieve heartburn because it only changes the position of your head.  Keep all follow-up visits as told by your health care provider. This is important. This includes any further testing if your chest pain does not go away. Contact a health care provider if:  Your chest pain does not go away.  You have a rash with blisters on your chest.  You have a fever.  You have chills. Get help right away if:  Your chest pain is worse.  You have a cough that gets worse, or you cough up blood.  You have severe pain in your abdomen.  You have severe weakness.  You faint.  You have sudden, unexplained  chest discomfort.  You have sudden, unexplained discomfort in your arms, back, neck, or jaw.  You have shortness of breath at any time.  You suddenly start to sweat, or your skin gets clammy.  You feel nauseous or you vomit.  You suddenly feel light-headed or dizzy.  Your heart begins to beat quickly, or it feels like it is skipping beats. These symptoms may represent a serious problem that is an emergency. Do not wait to see if the symptoms will go away. Get medical help right away. Call your local emergency services (911 in the [place] Do not drive yourself to the hospital. This information is not intended to replace advice given to you by your health care provider. Make sure you discuss any questions you have with your health care provider. Document Released: 05/20/2005 Document Revised: 05/04/2016 Document Reviewed: 05/04/2016 Elsevier Interactive Patient Education  2017 [employer].

## 2018-06-14 NOTE — Progress Notes (Deleted)
ifnlu

## 2018-06-17 ENCOUNTER — Other Ambulatory Visit: Payer: Self-pay | Admitting: Internal Medicine

## 2018-06-17 DIAGNOSIS — Z1231 Encounter for screening mammogram for malignant neoplasm of breast: Secondary | ICD-10-CM

## 2018-08-01 ENCOUNTER — Ambulatory Visit
Admission: RE | Admit: 2018-08-01 | Discharge: 2018-08-01 | Disposition: A | Discharge status: Home / Self Care | Payer: Medicare Other | Source: Ambulatory Visit | Attending: Internal Medicine | Admitting: Internal Medicine

## 2018-08-01 DIAGNOSIS — Z1231 Encounter for screening mammogram for malignant neoplasm of breast: Secondary | ICD-10-CM

## 2018-09-16 ENCOUNTER — Telehealth: Payer: Self-pay | Admitting: Physician Assistant

## 2018-09-16 MED ORDER — AZITHROMYCIN 250 MG PO TABS: ORAL_TABLET | ORAL | 1 refills | Status: AC

## 2018-09-16 MED ORDER — BENZONATATE 200 MG PO CAPS: 200.0000 mg | ORAL_CAPSULE | Freq: Three times a day (TID) | ORAL | 0 refills | Status: DC | PRN

## 2018-09-16 MED ORDER — PREDNISONE 20 MG PO TABS: ORAL_TABLET | ORAL | 0 refills | Status: DC

## 2018-09-16 NOTE — Telephone Encounter (Signed)
73 y.o. female calls with 7 days of URI symptoms. She is on Flonase, Claritin.  Symptoms include sinus pressure, sinus drainage and productive cough  Problem list has GERD (gastroesophageal reflux disease); Anxiety; Hiatal hernia; Vitamin D deficiency; Hyperlipidemia; Prediabetes; History of renal cell cancer; Labile hypertension; Osteoporosis; Neuropathy; Memory changes; and Atherosclerosis of aorta (HCC) on their problem list.  Medications Current Outpatient Medications on File Prior to Visit  Medication Sig  . B Complex-C (SUPER B COMPLEX PO) Take 1 tablet by mouth daily.   . Cholecalciferol (VITAMIN D) 2000 units tablet Take 1 tablet by mouth daily.  . diazepam (VALIUM) 2 MG tablet Take 1/2 to 1 tablet 2 to 3 x / day for acute anxiety &  please try to limit to 5 days /week to avoid addiction  . fluticasone (FLONASE) 50 MCG/ACT nasal spray Place 2 sprays into both nostrils daily. (Patient not taking: Reported on 06/14/2018)  . latanoprost (XALATAN) 0.005 % ophthalmic solution 1 drop into both eyes once daily  . nitroGLYCERIN (NITROSTAT) 0.4 MG SL tablet Place 1 tablet (0.4 mg total) under the tongue every 5 (five) minutes as needed for chest pain.  . pantoprazole (PROTONIX) 40 MG tablet Take 1 tablet (40 mg total) by mouth daily.  . predniSONE (DELTASONE) 20 MG tablet 1 tab 3 x day or as directed for TMJ  . promethazine-dextromethorphan (PROMETHAZINE-DM) 6.25-15 MG/5ML syrup Take 5 mLs by mouth 4 (four) times daily as needed for cough.   No current facility-administered medications on file prior to visit.     Allergies  Allergen Reactions  . Meloxicam Itching  . Nitrofurantoin Monohyd Macro     "flu like" symptoms   . Sulfa Antibiotics Hives    I have prescribed I have prescribed Azithromyin 250 mg: two tablets now and then one tablet daily for 4 additonal days.  Make sure you are on an allergy pill such as claritin, allegra or zyrtec.  You may use an oral decongestant such as  Mucinex D or if you have glaucoma or high blood pressure use plain Mucinex.  This is cough meds that you can take: A prescription cough medication called Tessalon Perles 100mg . You may take 1-2 capsules every 8 hours as needed for your cough.  If you develop worsening sinus pain, fever or notice severe headache and vision changes, or if symptoms are not better after completion of antibiotic, please schedule an appointment with a health care provider. If you are getting worse please go to the ER.

## 2018-09-16 NOTE — Telephone Encounter (Signed)
Pt has been informed of Rx sent and voiced understanding. DD

## 2018-10-24 ENCOUNTER — Ambulatory Visit: Payer: Medicare Other | Admitting: Adult Health

## 2018-10-24 ENCOUNTER — Emergency Department (HOSPITAL_COMMUNITY): Payer: Medicare Other

## 2018-10-24 ENCOUNTER — Encounter: Payer: Self-pay | Admitting: Adult Health

## 2018-10-24 ENCOUNTER — Other Ambulatory Visit: Payer: Self-pay

## 2018-10-24 ENCOUNTER — Emergency Department (HOSPITAL_COMMUNITY)
Admission: EM | Admit: 2018-10-24 | Discharge: 2018-10-24 | Disposition: A | Discharge status: Home / Self Care | Payer: Medicare Other | Attending: Emergency Medicine | Admitting: Emergency Medicine

## 2018-10-24 ENCOUNTER — Encounter (HOSPITAL_COMMUNITY): Payer: Self-pay | Admitting: *Deleted

## 2018-10-24 VITALS — BP 138/80 | HR 75 | Temp 97.9°F | Ht 61.0 in | Wt 145.0 lb

## 2018-10-24 DIAGNOSIS — I10 Essential (primary) hypertension: Secondary | ICD-10-CM | POA: Diagnosis not present

## 2018-10-24 DIAGNOSIS — R079 Chest pain, unspecified: Secondary | ICD-10-CM | POA: Diagnosis present

## 2018-10-24 DIAGNOSIS — Z87891 Personal history of nicotine dependence: Secondary | ICD-10-CM | POA: Diagnosis not present

## 2018-10-24 DIAGNOSIS — R0789 Other chest pain: Secondary | ICD-10-CM

## 2018-10-24 DIAGNOSIS — R5383 Other fatigue: Secondary | ICD-10-CM

## 2018-10-24 DIAGNOSIS — R030 Elevated blood-pressure reading, without diagnosis of hypertension: Secondary | ICD-10-CM

## 2018-10-24 DIAGNOSIS — Z79899 Other long term (current) drug therapy: Secondary | ICD-10-CM | POA: Diagnosis not present

## 2018-10-24 LAB — BASIC METABOLIC PANEL
Anion gap: 11 (ref 5–15)
BUN: 11 mg/dL (ref 8–23)
CO2: 23 mmol/L (ref 22–32)
Calcium: 9.3 mg/dL (ref 8.9–10.3)
Chloride: 105 mmol/L (ref 98–111)
Creatinine, Ser: 0.87 mg/dL (ref 0.44–1.00)
GFR calc Af Amer: >60 mL/min (ref >60)
GFR calc non Af Amer: >60 mL/min (ref >60)
Glucose, Bld: 100 mg/dL — ABNORMAL HIGH (ref 70–99)
Potassium: 3.9 mmol/L (ref 3.5–5.1)
Sodium: 139 mmol/L (ref 135–145)

## 2018-10-24 LAB — I-STAT TROPONIN, ED: Troponin i, poc: 0 ng/mL (ref 0.00–0.08)

## 2018-10-24 LAB — CBC
HCT: 41.7 % (ref 36.0–46.0)
Hemoglobin: 13 g/dL (ref 12.0–15.0)
MCH: 26.9 pg (ref 26.0–34.0)
MCHC: 31.2 g/dL (ref 30.0–36.0)
MCV: 86.2 fL (ref 80.0–100.0)
Platelets: 279 10*3/uL (ref 150–400)
RBC: 4.84 MIL/uL (ref 3.87–5.11)
RDW: 14 % (ref 11.5–15.5)
WBC: 7.7 10*3/uL (ref 4.0–10.5)
nRBC: 0 % (ref 0.0–0.2)

## 2018-10-24 MED ORDER — SODIUM CHLORIDE 0.9% FLUSH: 3.0000 mL | Freq: Once | INTRAVENOUS | Status: DC

## 2018-10-24 NOTE — ED Notes (Signed)
Patient verbalizes understanding of discharge instructions. Opportunity for questioning and answers were provided. Armband removed by staff, pt discharged from ED ambulatory w/ daughter  

## 2018-10-24 NOTE — ED Triage Notes (Signed)
Pt in c/o left sided chest pain that radiates down her left arm for the last several days, pain has been intermittent, called her PCP today and they advised her to come in to be seen

## 2018-10-24 NOTE — Progress Notes (Signed)
Assessment and Plan:  Kimberly Wilcox was seen today for acute visit.  Diagnoses and all orders for this visit:  Left chest pressure/ Fatigue, unspecified type L sided chest pain with LUE pain, fatigue, chest pain improved with nitro yesterday though didn't fully resolve Recommended she needs urgent workup in ED to r/o ACS, needs EKG, STAT serial troponins, possible repeat cath, imaging She declines EMS, departed from premises ambulatory in no acute distress, VS stable Advised she present to [facility] main ED and report CP concerning for MI  Follow up as recommended by ED   Further disposition pending results of labs. Discussed med's effects and SE's.   Over 15 minutes of exam, counseling, chart review, and critical decision making was performed.   Future Appointments  Date Time Provider [place]  11/09/2018 10:00 AM Vicie Mutters, PA-C [facility] None  04/27/2019 10:00 AM Vicie Mutters, PA-C [facility] None    ------------------------------------------------------------------------------------------------------------------   HPI BP 138/80   Pulse 75   Temp 97.9 F (36.6 C)   Ht 5\' 1"  (1.549 m)   Wt 145 lb (65.8 kg)   SpO2 96%   BMI 27.40 kg/m   73 y.o.female with hx of hypertension, hyperlipidemia, aortic atherosclerosis (normal heart cath in 2013), GERD, hiatal hernia, anxiety presents for evaluation of chest pressure with L shoulder/arm pain. She presents today with L sided chest pressure that she reports began last week, sharp pains resolved but now having residual pressure, additionally with a throbbing, aching in her left upper extremity, feeling fatigued. She was prescribed nitroglycerine last year due to chest pain of undertermined origin, she reports she did try taking nitroglycerine yesterday and that this improved her chest pain though didn't fully resolve. She additionally reports feeling vaguely "off" and "funny."   She has hx of chest discomfort remotely, had cath  in 2013 which was normal, last saw Dr. Johnsie Cancel in 06/2016 with plan to follow up PRN  Has normal EGD 02/03/2016 by Dr. Amedeo Plenty   Past Medical History:  Diagnosis Date  . Anxiety   . Chest discomfort   . GERD (gastroesophageal reflux disease)   . Hiatal hernia   . Hypertension    labile, normal cath 2013  . Osteoporosis   . Renal cell cancer ([place]) 1999   LEFT NEPHRECTOMY  . Vitamin D deficiency      Allergies  Allergen Reactions  . Meloxicam Itching  . Nitrofurantoin Monohyd Macro     "flu like" symptoms   . Sulfa Antibiotics Hives    Current Outpatient Medications on File Prior to Visit  Medication Sig  . B Complex-C (SUPER B COMPLEX PO) Take 1 tablet by mouth daily.   . Cholecalciferol (VITAMIN D) 2000 units tablet Take 1 tablet by mouth daily.  . diazepam (VALIUM) 2 MG tablet Take 1/2 to 1 tablet 2 to 3 x / day for acute anxiety &  please try to limit to 5 days /week to avoid addiction  . fluticasone (FLONASE) 50 MCG/ACT nasal spray Place 2 sprays into both nostrils daily.  Marland Kitchen latanoprost (XALATAN) 0.005 % ophthalmic solution 1 drop into both eyes once daily  . nitroGLYCERIN (NITROSTAT) 0.4 MG SL tablet Place 1 tablet (0.4 mg total) under the tongue every 5 (five) minutes as needed for chest pain.  . pantoprazole (PROTONIX) 40 MG tablet Take 1 tablet (40 mg total) by mouth daily.  . benzonatate (TESSALON) 200 MG capsule Take 1 capsule (200 mg total) by mouth 3 (three) times daily as needed for cough (Max:  600mg  per day). (Patient not taking: Reported on 10/24/2018)  . predniSONE (DELTASONE) 20 MG tablet 2 tablets daily for 3 days, 1 tablet daily for 4 days.  . promethazine-dextromethorphan (PROMETHAZINE-DM) 6.25-15 MG/5ML syrup Take 5 mLs by mouth 4 (four) times daily as needed for cough.   No current facility-administered medications on file prior to visit.     ROS: all negative except above.   Physical Exam:  BP 138/80   Pulse 75   Temp 97.9 F (36.6 C)   Ht 5\' 1"   (1.549 m)   Wt 145 lb (65.8 kg)   SpO2 96%   BMI 27.40 kg/m   General Appearance: Well nourished, in no apparent distress. Eyes: PERRLA, conjunctiva no swelling or erythema ENT/Mouth: Hearing normal.  Neck: Supple Respiratory: Respiratory effort normal, BS equal bilaterally without rales, rhonchi, wheezing or stridor.  Cardio: RRR with no MRGs. Brisk peripheral pulses without edema.  Abdomen: Soft, + BS.  Non tender, no guarding, rebound, hernias, masses. Lymphatics: Non tender without lymphadenopathy.  Musculoskeletal: normal gait.  Skin: Warm, dry without rashes, lesions, ecchymosis.  Neuro: Normal muscle tone, no cerebellar symptoms. Psych: Awake and oriented X 3, normal affect, Insight and Judgment appropriate.     Kimberly Ribas, NP 2:03 PM [facility]

## 2018-10-24 NOTE — ED Notes (Signed)
RN sent 1 visitor back 

## 2018-10-24 NOTE — Discharge Instructions (Addendum)

## 2018-10-24 NOTE — ED Provider Notes (Signed)
[facility] EMERGENCY DEPARTMENT Provider Note   CSN: [id number] Arrival date & time: 10/24/18  1449    History   Chief Complaint Chief Complaint  Patient presents with  . Chest Pain    HPI   HPI: A 73 year old patient presents for evaluation of chest pain. Initial onset of pain was more than 6 hours ago. The patient's chest pain is sharp, is not worse with exertion and is relieved by nitroglycerin. The patient complains of nausea. The patient's chest pain is middle- or left-sided, is not well-localized, is not described as heaviness/pressure/tightness and does radiate to the arms/jaw/neck. The patient denies diaphoresis. The patient has no history of stroke, has no history of peripheral artery disease, has not smoked in the past 90 days, denies any history of treated diabetes, has no relevant family history of coronary artery disease (first degree relative at less than age 75), is not hypertensive, has no history of hypercholesterolemia and does not have an elevated BMI (>=30).    Last heart cath in 2013 showed NO evidence of CAD.  HPI  Past Medical History:  Diagnosis Date  . Anxiety   . Chest discomfort   . GERD (gastroesophageal reflux disease)   . Hiatal hernia   . Hypertension    labile, normal cath 2013  . Osteoporosis   . Renal cell cancer ([place]) 1999   LEFT NEPHRECTOMY  . Vitamin D deficiency     Patient Active Problem List   Diagnosis Date Noted  . Atherosclerosis of aorta ([place]) 04/14/2018  . Neuropathy 01/09/2015  . Memory changes 01/09/2015  . Hyperlipidemia 10/29/2014  . Prediabetes 10/29/2014  . History of renal cell cancer 10/29/2014  . Labile hypertension 10/29/2014  . Osteoporosis 10/29/2014  . Vitamin D deficiency   . GERD (gastroesophageal reflux disease)   . Anxiety   . Hiatal hernia     Past Surgical History:  Procedure Laterality Date  . LEFT OOPHORECTOMY  1985   Benign tumor  . NEPHRECTOMY Left 1999   secondary to cancer   . TUBAL LIGATION       OB History    Gravida  4   Para  4   Term      Preterm      AB      Living  4     SAB      TAB      Ectopic      Multiple      Live Births               Home Medications    Prior to Admission medications   Medication Sig Start Date End Date Taking? Authorizing Provider  B Complex-C (SUPER B COMPLEX PO) Take 1 tablet by mouth daily.    Yes [provider]  Cholecalciferol (VITAMIN D) 2000 units tablet Take 2,000 Units by mouth daily.    Yes [provider]  diazepam (VALIUM) 2 MG tablet Take 1/2 to 1 tablet 2 to 3 x / day for acute anxiety &  please try to limit to 5 days /week to avoid addiction Patient taking differently: Take 1-2 mg by mouth See admin instructions. Take 1-2 mg by mouth two to three times a day for acute anxiety and "please try to limit to 5 days a week to avoid addiction" 06/14/18  Yes Liane Comber, NP  fluticasone (FLONASE) 50 MCG/ACT nasal spray Place 2 sprays into both nostrils daily. Patient taking differently: Place 2 sprays into both  nostrils daily as needed for allergies or rhinitis.  09/24/16  Yes Rolene Course, PA-C  latanoprost (XALATAN) 0.005 % ophthalmic solution Place 1 drop into both eyes at bedtime.  11/07/14  Yes [provider]  naproxen sodium (ALEVE) 220 MG tablet Take 220 mg by mouth 2 (two) times daily as needed (for headaches or pain).   Yes [provider]  nitroGLYCERIN (NITROSTAT) 0.4 MG SL tablet Place 1 tablet (0.4 mg total) under the tongue every 5 (five) minutes as needed for chest pain. 06/14/18 11/21/24 Yes Liane Comber, NP  pantoprazole (PROTONIX) 40 MG tablet Take 1 tablet (40 mg total) by mouth daily. 06/14/18  Yes Liane Comber, NP  benzonatate (TESSALON) 200 MG capsule Take 1 capsule (200 mg total) by mouth 3 (three) times daily as needed for cough (Max: 600mg  per day). Patient not taking: Reported on 10/24/2018 09/16/18   Vicie Mutters, PA-C    predniSONE (DELTASONE) 20 MG tablet 2 tablets daily for 3 days, 1 tablet daily for 4 days. Patient not taking: Reported on 10/24/2018 09/16/18   Vicie Mutters, PA-C  promethazine-dextromethorphan (PROMETHAZINE-DM) 6.25-15 MG/5ML syrup Take 5 mLs by mouth 4 (four) times daily as needed for cough. Patient not taking: Reported on 10/24/2018 06/14/18   Liane Comber, NP    Family History Family History  Problem Relation Age of Onset  . Pneumonia Father   . Other Brother        Brain disease    Social History Social History   Tobacco Use  . Smoking status: Former Smoker    Last attempt to quit: 10/28/1984    Years since quitting: 34.0  . Smokeless tobacco: Never Used  Substance Use Topics  . Alcohol use: Yes    Alcohol/week: 0.0 standard drinks    Comment: wine 2-3 glasses once a month  . Drug use: No     Allergies   Meloxicam; Nitrofurantoin; Nitrofurantoin monohyd macro; and Sulfa antibiotics   Review of Systems Review of Systems Ten systems reviewed and are negative for acute change, except as noted in the HPI.    Physical Exam Updated Vital Signs BP (!) 145/73 (BP Location: Right Arm)   Pulse 66   Temp 98.1 F (36.7 C) (Oral)   Resp 14   SpO2 98%   Physical Exam Vitals signs and nursing note reviewed.  Constitutional:      General: She is not in acute distress.    Appearance: She is well-developed. She is not diaphoretic.  HENT:     Head: Normocephalic and atraumatic.  Eyes:     General: No scleral icterus.    Conjunctiva/sclera: Conjunctivae normal.  Neck:     Musculoskeletal: Normal range of motion.  Cardiovascular:     Rate and Rhythm: Normal rate and regular rhythm.     Heart sounds: Normal heart sounds. No murmur. No friction rub. No gallop.   Pulmonary:     Effort: Pulmonary effort is normal. No respiratory distress.     Breath sounds: Normal breath sounds.  Chest:     Chest wall: Tenderness (Left pectoralis region) present.  Abdominal:      General: Bowel sounds are normal. There is no distension.     Palpations: Abdomen is soft. There is no mass.     Tenderness: There is no abdominal tenderness. There is no guarding.  Skin:    General: Skin is warm and dry.  Neurological:     Mental Status: She is alert and oriented to person, place, and  time.  Psychiatric:        Behavior: Behavior normal.     Comments: anxious      ED Treatments / Results  Labs (all labs ordered are listed, but only abnormal results are displayed) Labs Reviewed  BASIC METABOLIC PANEL - Abnormal; Notable for the following components:      Result Value   Glucose, Bld 100 (*)    All other components within normal limits  CBC  I-STAT TROPONIN, ED  I-STAT TROPONIN, ED    EKG EKG Interpretation  Date/Time:  Monday October 24 2018 14:54:17 EST Ventricular Rate:  79 PR Interval:  138 QRS Duration: 82 QT Interval:  370 QTC Calculation: 424 R Axis:   66 Text Interpretation:  Normal sinus rhythm No significant change since last tracing unremarkable ecg Confirmed by Carmin Muskrat [phone]) on 10/24/2018 3:00:28 PM   Radiology Dg Chest 2 View  Result Date: 10/24/2018 CLINICAL DATA:  Left-sided pain. Cough. Nausea for 1 week. Ex-smoker. EXAM: CHEST - 2 VIEW COMPARISON:  05/26/2016 FINDINGS: Hyperinflation. Numerous leads and wires project over the chest. Midline trachea. Normal heart size and mediastinal contours. No pleural effusion or pneumothorax. Mild biapical pleural thickening. Clear lungs. IMPRESSION: Hyperinflation, without acute disease. Electronically Signed   By: Dennys Traughber Miyamoto M.D.   On: 10/24/2018 15:51    Procedures Procedures (including critical care time)  Medications Ordered in ED Medications  sodium chloride flush (NS) 0.9 % injection 3 mL (has no administration in time range)     Initial Impression / Assessment and Plan / ED Course  I have reviewed the triage vital signs and the nursing notes.  Pertinent labs & imaging results  that were available during my care of the patient were reviewed by me and considered in my medical decision making (see chart for details).  Clinical Course as of Oct 24 1907  Mon Oct 24, 5987  474 74 year old female with complaint of intermittent chest pain that is been going on over a week.  No trauma no shortness of breath no recent illness.  She said she had like a flulike illness that she got treated with antibiotics couple months ago.  Not worse with exertion or deep breath.  She said she has had chest pains before and they done heart catheterizations that have been negative.  She tried to see her PCP today but they told her to come to the hospital.  She is unremarkable vitals and has a benign exam.  Likely delta troponin and outpatient follow-up if initial work-up negative.   [MB]  9833 Initial troponin negative, however it did not cross over.   [AH]    Clinical Course User Index [AH] Margarita Mail, PA-C [MB] Hayden Rasmussen, MD    HEAR Score: 82  73 year old female here with atypical chest pain.  Her EKG shows normal sinus rhythm.  With no repull abnormalities or signs of ischemia.  No arrhythmias.  She has 2- troponins here.  Chest x-ray shows no acute abnormalities.  I personally reviewed the PA and lateral chest film and agree with the radiologic interpretation.  Patient is not having shortness of breath, hypoxia, exertional dyspnea, orthopnea or PND.  No unilateral leg swelling, no risk factors for pulmonary embolus.  Have very low suspicion for any emergent cause of her pain.  No widened mediastinum on chest x-ray suggestive of aortic dissection.  She has no neurologic abnormalities.  Patient will be discharged to follow closely with her primary care physician.  Suggest outpatient  cardiac stress test.  Discussed return precautions.  She appears appropriate for discharge at this time.  Final Clinical Impressions(s) / ED Diagnoses   Final diagnoses:  Atypical chest pain  Elevated  blood pressure reading    ED Discharge Orders    None       Margarita Mail, PA-C 10/24/18 1909    Hayden Rasmussen, MD 10/27/18 [phone]

## 2018-10-25 ENCOUNTER — Other Ambulatory Visit: Payer: Self-pay | Admitting: Adult Health

## 2018-10-25 DIAGNOSIS — R0789 Other chest pain: Secondary | ICD-10-CM

## 2018-10-25 LAB — I-STAT TROPONIN, ED: Troponin i, poc: 0 ng/mL (ref 0.00–0.08)

## 2018-10-25 NOTE — Progress Notes (Signed)
Patient was evaluated for atypical chest pain in ED for L sided chest pressure with L upper extremity pain that she reported had improved after taking nitroglycerine prescribed at last visit. Workup in ED including EKG, serial troponins, CXR was negative, was discharged with recommendation for outpatient stress test. She is established with Dr. Johnsie Cancel, last seen 2017. Patient was contacted to discuss via phone by CMA, requesting referral back to Dr. Johnsie Cancel which we will provide today.

## 2018-10-28 ENCOUNTER — Ambulatory Visit: Payer: Medicare Other | Admitting: Cardiovascular Disease

## 2018-11-06 NOTE — Progress Notes (Signed)
MEDICARE ANNUAL WELLNESS VISIT AND CPE  Assessment:   Labile hypertension - continue medications, DASH diet, exercise and monitor at home. Call if greater than 130/80.  -     CBC with Differential/Platelet -     BASIC METABOLIC PANEL WITH GFR -     Hepatic function panel -     TSH  Neuropathy Improved  Hyperlipidemia, unspecified hyperlipidemia type -continue medications, check lipids, decrease fatty foods, increase activity.  -     Lipid panel  Prediabetes Discussed general issues about diabetes pathophysiology and management., Educational material distributed., Suggested low cholesterol diet., Encouraged aerobic exercise., Discussed foot care., Reminded to get yearly retinal exam. -     Hemoglobin A1c  History of renal cell cancer monitor  Osteoporosis, unspecified osteoporosis type, unspecified pathological fracture presence continue Vit D and Ca, weight bearing exercises  Hiatal hernia Continue PPI/H2 blocker, diet discussed  Gastroesophageal reflux disease with esophagitis Continue PPI/H2 blocker, diet discussed  Anxiety continue medications, stress management techniques discussed, increase water, good sleep hygiene discussed, increase exercise, and increase veggies.   Vitamin D deficiency Continue supplement  Memory changes Follows Neuro Cut back on valium use  Medication management -     Magnesium  Encounter for Medicare annual wellness exam   Over 40 minutes of exam, counseling, chart review and critical decision making was performed Future Appointments  Date Time Provider [place]  11/21/2018 11:00 AM Josue Hector, MD [facility]  04/27/2019 10:00 AM Vicie Mutters, PA-C [facility] None  11/13/2019 10:00 AM Vicie Mutters, PA-C [facility] None     Plan:   During the course of the visit the patient was educated and counseled about appropriate screening and preventive services including:    Pneumococcal vaccine    Prevnar 13  Influenza vaccine  Td vaccine  Screening electrocardiogram  Bone densitometry screening  Colorectal cancer screening  Diabetes screening  Glaucoma screening  Nutrition counseling   Advanced directives: requested   Subjective:  AGNIESZKA Wilcox is a 73 y.o. female who presents for Medicare Annual Wellness Visit and complete physical.    She is higher risk with her age, HTN, atherosclerosis of the aorta, history of renal cell cancer so she canceled her trip next month. She is unable to get a refund at this time with [employer] and due to the medical co morbidities she does not want to reschedule and just wants a refund.   Her blood pressure has been controlled at home, today their BP is BP: 136/80  She does workout, walking and states that her neuropathy has improved. She denies chest pain, shortness of breath, dizziness.  She is not on cholesterol medication and denies myalgias. Her cholesterol is at goal. The cholesterol last visit was:   Lab Results  Component Value Date   CHOL 239 (H) 04/14/2018   HDL 94 04/14/2018   LDLCALC 122 (H) 04/14/2018   TRIG 118 04/14/2018   CHOLHDL 2.5 04/14/2018   She has history of anxiety, can not tolerate SSRI, will take valium 1/2 pill, taking 1 pill every other day which is more than ususal due to coronavirus anxiety.    She has been working on diet and exercise for prediabetes, and denies paresthesia of the feet, polydipsia, polyuria and visual disturbances. Last A1C in the office was:  Lab Results  Component Value Date   HGBA1C 5.8 (H) 04/14/2018   Lab Results  Component Value Date   GFRNONAA >60 10/24/2018  Patient is on Vitamin D supplement.  Lab Results  Component Value Date   VD25OH 32 09/24/2017  BMI is Body mass index is 27.78 kg/m., she is working on diet and exercise. Wt Readings from Last 3 Encounters:  11/09/18 147 lb (66.7 kg)  10/24/18 145 lb (65.8 kg)  06/14/18 149 lb (67.6 kg)        Medication Review: Current Outpatient Medications on File Prior to Visit  Medication Sig Dispense Refill  . B Complex-C (SUPER B COMPLEX PO) Take 1 tablet by mouth daily.     . Cholecalciferol (VITAMIN D) 2000 units tablet Take 2,000 Units by mouth daily.     . diazepam (VALIUM) 2 MG tablet Take 1/2 to 1 tablet 2 to 3 x / day for acute anxiety &  please try to limit to 5 days /week to avoid addiction (Patient taking differently: Take 1-2 mg by mouth See admin instructions. Take 1-2 mg by mouth two to three times a day for acute anxiety and "please try to limit to 5 days a week to avoid addiction") 90 tablet 0  . latanoprost (XALATAN) 0.005 % ophthalmic solution Place 1 drop into both eyes at bedtime.     . naproxen sodium (ALEVE) 220 MG tablet Take 220 mg by mouth 2 (two) times daily as needed (for headaches or pain).    . nitroGLYCERIN (NITROSTAT) 0.4 MG SL tablet Place 1 tablet (0.4 mg total) under the tongue every 5 (five) minutes as needed for chest pain. 25 tablet 4  . pantoprazole (PROTONIX) 40 MG tablet Take 1 tablet (40 mg total) by mouth daily. 90 tablet 1   No current facility-administered medications on file prior to visit.     Allergies  Allergen Reactions  . Meloxicam Itching  . Nitrofurantoin Itching and Other (See Comments)    "Flu-like" symptoms, also  . Nitrofurantoin Monohyd Macro Itching and Other (See Comments)    "Flu-like" symptoms, also  . Sulfa Antibiotics Hives and Itching    Current Problems (verified) Patient Active Problem List   Diagnosis Date Noted  . Atherosclerosis of aorta ([place]) 04/14/2018  . Neuropathy 01/09/2015  . Memory changes 01/09/2015  . Hyperlipidemia 10/29/2014  . Abnormal glucose 10/29/2014  . History of renal cell cancer 10/29/2014  . Labile hypertension 10/29/2014  . Osteoporosis 10/29/2014  . Vitamin D deficiency   . GERD (gastroesophageal reflux disease)   . Anxiety   . Hiatal hernia     Screening Tests Immunization History   Administered Date(s) Administered  . Influenza, High Dose Seasonal PF 06/22/2017, 06/14/2018  . Influenza, Seasonal, Injecte, Preservative Fre 07/28/2016  . Pneumococcal Conjugate-13 09/17/2015  . Pneumococcal Polysaccharide-23 10/04/2012  . Td 10/04/2009   Preventative care: Last colonoscopy:01/2016, Dr. Amedeo Plenty EGD 2017 Last mammogram: 07/2018 Last pap smear/pelvic exam: 2011  remote DEXA: 10/2014 MRI brain 10/2014 Ct AB 66/0630 US carotids LICA <1601 [place] AB 0932 CXR 2017  Prior vaccinations: TD or Tdap: 2011  Influenza: 2019 Pneumococcal: 2014 Prevnar13: 2017 Shingles/Zostavax: declines  Names of Other Physician/Practitioners you currently use: 1. [facility] here for primary care 2. Dr. Katy Fitch, eye doctor, last visit April 2018 3. Gwyndolyn Saxon, dentist, last visit 2019 Patient Care Team: Unk Pinto, MD as PCP - General (Internal Medicine) Warden Fillers, MD as Consulting Physician (Optometry) Josue Hector, MD as Consulting Physician (Cardiology) Alexis Frock, MD as Consulting Physician (Urology) Teena Irani, MD (Inactive) as Consulting Physician (Gastroenterology)  SURGICAL HISTORY She  has a past surgical history that includes Nephrectomy (Left, 1999);  Left oophorectomy (1985); and Tubal ligation. FAMILY HISTORY Her family history includes Other in her brother; Pneumonia in her father. SOCIAL HISTORY She  reports that she quit smoking about 34 years ago. She has never used smokeless tobacco. She reports current alcohol use. She reports that she does not use drugs.   MEDICARE WELLNESS OBJECTIVES: Physical activity: Current Exercise Habits: Home exercise routine, Type of exercise: walking, Time (Minutes): 25 Cardiac risk factors: Cardiac Risk Factors include: advanced age (>71men, >7 women);hypertension;dyslipidemia;sedentary lifestyle Depression/mood screen:   Depression screen [facility] 2/9 11/09/2018  Decreased Interest  0  Down, Depressed, Hopeless 0  PHQ - 2 Score 0    ADLs:  In your present state of health, do you have any difficulty performing the following activities: 11/09/2018 04/14/2018  Hearing? N N  Vision? N N  Difficulty concentrating or making decisions? N N  Walking or climbing stairs? N N  Dressing or bathing? N N  Doing errands, shopping? N N  Some recent data might be hidden     Cognitive Testing  Alert? Yes  Normal Appearance?Yes  Oriented to person? Yes  Place? Yes   Time? Yes  Recall of three objects?  Yes  Can perform simple calculations? Yes  Displays appropriate judgment?Yes  Can read the correct time from a watch face?Yes  EOL planning: Does Patient Have a Medical Advance Directive?: Yes Does patient want to make changes to medical advance directive?: No - Patient declined  Review of Systems  Constitutional: Negative.   HENT: Negative.   Eyes: Negative for blurred vision, double vision, photophobia, pain, discharge and redness.  Respiratory: Negative.   Cardiovascular: Negative.   Gastrointestinal: Positive for abdominal pain. Negative for blood in stool, constipation, diarrhea, heartburn, melena, nausea and vomiting.  Genitourinary: Positive for frequency. Negative for dysuria, flank pain, hematuria and urgency.  Musculoskeletal: Negative.   Skin: Negative.   Neurological: Positive for sensory change (bilateral feet). Negative for dizziness, tingling, tremors, speech change, focal weakness, seizures and loss of consciousness.  Psychiatric/Behavioral: Negative for depression, hallucinations, memory loss, substance abuse and suicidal ideas. The patient is not nervous/anxious and does not have insomnia.      Objective:     Today's Vitals   11/09/18 0939  BP: 136/80  Temp: 98 F (36.7 C)  Weight: 147 lb (66.7 kg)  Height: 5\' 1"  (1.549 m)   Body mass index is 27.78 kg/m.  General appearance: alert, no distress, WD/WN, female HEENT: normocephalic, sclerae  anicteric, TMs pearly, nares patent, no discharge or erythema, pharynx normal Oral cavity: MMM, no lesions Neck: supple, no lymphadenopathy, no thyromegaly, no masses Heart: RRR, normal S1, S2, no murmurs Lungs: CTA bilaterally, no wheezes, rhonchi, or rales Abdomen: +bs, soft, non tender, non distended, no masses, no hepatomegaly, no splenomegaly Musculoskeletal: nontender, no swelling, no obvious deformity Extremities: no edema, no cyanosis, no clubbing Pulses: 2+ symmetric, upper and lower extremities, normal cap refill Neurological: alert, oriented x 3, CN2-12 intact, strength normal upper extremities and lower extremities, sensation decreased bilateral feet, DTRs 2+ throughout, no cerebellar signs, gait normal Psychiatric: normal affect, behavior normal, pleasant  GYN: + cystocele, no masses appreciated, + vaginal atrophy.   Medicare Attestation I have personally reviewed: The patient's medical and social history Their use of alcohol, tobacco or illicit drugs Their current medications and supplements The patient's functional ability including ADLs,fall risks, home safety risks, cognitive, and hearing and visual impairment Diet and physical activities Evidence for depression or mood disorders  The patient's weight, height,  BMI, and visual acuity have been recorded in the chart.  I have made referrals, counseling, and provided education to the patient based on review of the above and I have provided the patient with a written personalized care plan for preventive services.     Vicie Mutters, PA-C   11/09/2018

## 2018-11-09 ENCOUNTER — Other Ambulatory Visit: Payer: Self-pay

## 2018-11-09 ENCOUNTER — Encounter: Payer: Self-pay | Admitting: Physician Assistant

## 2018-11-09 ENCOUNTER — Ambulatory Visit (INDEPENDENT_AMBULATORY_CARE_PROVIDER_SITE_OTHER): Payer: Medicare Other | Admitting: Physician Assistant

## 2018-11-09 VITALS — BP 136/80 | Temp 98.0°F | Ht 61.0 in | Wt 147.0 lb

## 2018-11-09 DIAGNOSIS — I700 Atherosclerosis of aorta: Secondary | ICD-10-CM

## 2018-11-09 DIAGNOSIS — Z0001 Encounter for general adult medical examination with abnormal findings: Secondary | ICD-10-CM | POA: Diagnosis not present

## 2018-11-09 DIAGNOSIS — Z79899 Other long term (current) drug therapy: Secondary | ICD-10-CM

## 2018-11-09 DIAGNOSIS — R6889 Other general symptoms and signs: Secondary | ICD-10-CM | POA: Diagnosis not present

## 2018-11-09 DIAGNOSIS — E785 Hyperlipidemia, unspecified: Secondary | ICD-10-CM

## 2018-11-09 DIAGNOSIS — R413 Other amnesia: Secondary | ICD-10-CM

## 2018-11-09 DIAGNOSIS — E611 Iron deficiency: Secondary | ICD-10-CM

## 2018-11-09 DIAGNOSIS — G629 Polyneuropathy, unspecified: Secondary | ICD-10-CM

## 2018-11-09 DIAGNOSIS — R0989 Other specified symptoms and signs involving the circulatory and respiratory systems: Secondary | ICD-10-CM

## 2018-11-09 DIAGNOSIS — F419 Anxiety disorder, unspecified: Secondary | ICD-10-CM

## 2018-11-09 DIAGNOSIS — K449 Diaphragmatic hernia without obstruction or gangrene: Secondary | ICD-10-CM

## 2018-11-09 DIAGNOSIS — R7309 Other abnormal glucose: Secondary | ICD-10-CM | POA: Diagnosis not present

## 2018-11-09 DIAGNOSIS — E538 Deficiency of other specified B group vitamins: Secondary | ICD-10-CM

## 2018-11-09 DIAGNOSIS — Z85528 Personal history of other malignant neoplasm of kidney: Secondary | ICD-10-CM

## 2018-11-09 DIAGNOSIS — K2100 Gastro-esophageal reflux disease with esophagitis, without bleeding: Secondary | ICD-10-CM

## 2018-11-09 DIAGNOSIS — E559 Vitamin D deficiency, unspecified: Secondary | ICD-10-CM

## 2018-11-09 DIAGNOSIS — Z0000 Encounter for general adult medical examination without abnormal findings: Secondary | ICD-10-CM

## 2018-11-09 DIAGNOSIS — M810 Age-related osteoporosis without current pathological fracture: Secondary | ICD-10-CM

## 2018-11-09 DIAGNOSIS — J209 Acute bronchitis, unspecified: Secondary | ICD-10-CM

## 2018-11-09 MED ORDER — FLUTICASONE PROPIONATE 50 MCG/ACT NA SUSP: 2.0000 | Freq: Every day | NASAL | 3 refills | Status: DC

## 2018-11-09 MED ORDER — ALBUTEROL SULFATE HFA 108 (90 BASE) MCG/ACT IN AERS: 2.0000 | INHALATION_SPRAY | RESPIRATORY_TRACT | 0 refills | Status: DC | PRN

## 2018-11-09 NOTE — Patient Instructions (Addendum)
Get a salonpas patch and use on your left upper chest at night 12 hours on, 12 hours off for 2-3 nights.   NEW GUIDELINES FOR BENOZOS  New guidelines suggest the benzodiazepines are best short term, with prolonged use they lead to physical and psychological dependence. In addition, evidence suggest that for insomnia the effectiveness wanes in 4 weeks and the risks out weight their benefits. Use of these agents have been associated with dementia, falls, motor vehicle accidents and physical addiction. Decreasing these medication have been proven to show improvements in cognition, alertness, decrease of falls and daytime sedation.   We will start a slow taper, symptoms of withdrawal include, insomnia, anxiety, irritability, sweating and stomach or intestinal symptoms like diarrhea or nausea.    If you are receiving this MyChart message, you are a patient at [facility], if you are not please disregard.   We are certain at this time everyone has heard of the novel coronavirus, now called COVID 19. We want to give information to our patients about how to protect yourself and family, what to do if you feel you have symptoms, and how we are working to keep you informed.   At this time we are NOT accepting walk in appointments.  For your convenience and to prevent transmission, we are now performing Evisits or telephone encounters for sick visits.  You can contact one of your providers through [place], your patient portal at any time or call the office during business hours.    PLEASE DO NOT CLICK THE EVISIT BUTTON ON MY CHART, INSTEAD CHOOSE TO MESSAGE YOUR PROVIDER AND SEND [place] A MESSAGE WITH YOUR ISSUES.   Keeping with community standards, when you seek medical attention through a telephone or E-visit encounter there may be a copay or office fee of 25 dollars for services rendered. However, in light of the coronavirus, the majority of insurances are covering  telemedicine visits and waiving copays.   Please remember that this virus is happening at the same time as other respiratory viruses, such as influenza, common colds and now allergy season.   To reduce your risk of infection we recommend the same precautions as those used to avoid the common cold and flu virus  . Please wash your hands with soap and water for 20 seconds and frequently. Wendee Copp your hands before you eat or touch your face.  . Avoid touching your face, eyes, nose, and mouth as much as possible.  . if needing to cough or sneeze cover your mouth and nose by coughing or sneezing into your elbow area, your sleeve or a tissue . Routinely clean frequently touched items such as doorknobs, keyboards, and phones.  . Avoid crowds of people.  Marland Kitchen avoid shaking hands with others; . We recommend anyone within the high-risk demographics such as older adults and those with heart/lung disease, auto-immune or immune-suppressing health conditions to consider reduced travel and public outings.  Please refer to the sources below for current updates, guidelines and recommendations.  You can call this hotline set up by [facility] hospital 1 877 40COVID [phone])  You can visit these websites: CDC.gov WHO.int  Please visit the [place] webpage for the latest health notices and updates on local and international travel.  What are the symptoms? There are many different symptoms reported HOWEVER the most common associated with the virus at this time is FEVER, DRY COUGH, and SHORTNESS OF BREATH.   What to do if you have  symptoms or you feel that you have come in contact with someone that may be infected with coronavirus? Please stay at home.  Call our office or send a mychart message.  Please remember the majority of cases are self-limited and do not require in hospital intervention.  There is no specific treatment for a mild case of the virus other than supportive care.  Please only go  to the ER if your symptoms escalate such as worsening shortness of breath, chest pain, and confusion.   Do you need a mask? You do not need a mask if you do not have symptoms. If you have a fever, cough, or shortness of breath please wear a mask to your appointment if you have one or we will provide one for you.   Testing for Coronavirus If after a mychart communication or telephone visit, we feel it is needed to get testing we will refer you to a testing site at [facility] after we put in an order. This lab is an outpatient lab, the turnaround time is 3-5 days, during which time you will be asked to self-quarantine and be given instructions.   We will not test you if you do not have symptoms or have not had potential contact at this time.  We will not test you if you just show up to the office, you need an appointment specifically for testing, please call or message first.     Costochondritis  Costochondritis is swelling and irritation (inflammation) of the tissue (cartilage) that connects your ribs to your breastbone (sternum). This causes pain in the front of your chest. The pain usually starts gradually and involves more than one rib. What are the causes? The exact cause of this condition is not always known. It results from stress on the cartilage where your ribs attach to your sternum. The cause of this stress could be:  Chest injury (trauma).  Exercise or activity, such as lifting.  Severe coughing. What increases the risk? You may be at higher risk for this condition if you:  Are female.  Are 82?74 years old.  Recently started a new exercise or work activity.  Have low levels of vitamin D.  Have a condition that makes you cough frequently. What are the signs or symptoms? The main symptom of this condition is chest pain. The pain:  Usually starts gradually and can be sharp or dull.  Gets worse with deep breathing, coughing, or exercise.  Gets better with  rest.  May be worse when you press on the sternum-rib connection (tenderness). How is this diagnosed? This condition is diagnosed based on your symptoms, medical history, and a physical exam. Your health care provider will check for tenderness when pressing on your sternum. This is the most important finding. You may also have tests to rule out other causes of chest pain. These may include:  A chest X-ray to check for lung problems.  An electrocardiogram (ECG) to see if you have a heart problem that could be causing the pain.  An imaging scan to rule out a chest or rib fracture. How is this treated? This condition usually goes away on its own over time. Your health care provider may prescribe an NSAID to reduce pain and inflammation. Your health care provider may also suggest that you:  Rest and avoid activities that make pain worse.  Apply heat or cold to the area to reduce pain and inflammation.  Do exercises to stretch your chest muscles. If these  treatments do not help, your health care provider may inject a numbing medicine at the sternum-rib connection to help relieve the pain. Follow these instructions at home:  Avoid activities that make pain worse. This includes any activities that use chest, abdominal, and side muscles.  If directed, put ice on the painful area: ? Put ice in a plastic bag. ? Place a towel between your skin and the bag. ? Leave the ice on for 20 minutes, 2-3 times a day.  If directed, apply heat to the affected area as often as told by your health care provider. Use the heat source that your health care provider recommends, such as a moist heat pack or a heating pad. ? Place a towel between your skin and the heat source. ? Leave the heat on for 20-30 minutes. ? Remove the heat if your skin turns bright red. This is especially important if you are unable to feel pain, heat, or cold. You may have a greater risk of getting burned.  Take over-the-counter and  prescription medicines only as told by your health care provider.  Return to your normal activities as told by your health care provider. Ask your health care provider what activities are safe for you.  Keep all follow-up visits as told by your health care provider. This is important. Contact a health care provider if:  You have chills or a fever.  Your pain does not go away or it gets worse.  You have a cough that does not go away (is persistent). Get help right away if:  You have shortness of breath. This information is not intended to replace advice given to you by your health care provider. Make sure you discuss any questions you have with your health care provider. Document Released: 05/20/2005 Document Revised: 05/12/2017 Document Reviewed: 12/04/2015 Elsevier Interactive Patient Education  [employer].

## 2018-11-10 LAB — CBC WITH DIFFERENTIAL/PLATELET
Absolute Monocytes: 372 cells/uL (ref 200–950)
Basophils Absolute: 43 cells/uL (ref 0–200)
Basophils Relative: 0.7 %
Eosinophils Absolute: 122 cells/uL (ref 15–500)
Eosinophils Relative: 2 %
HCT: 38.2 % (ref 35.0–45.0)
Hemoglobin: 12.6 g/dL (ref 11.7–15.5)
Lymphs Abs: 1745 cells/uL (ref 850–3900)
MCH: 27.6 pg (ref 27.0–33.0)
MCHC: 33 g/dL (ref 32.0–36.0)
MCV: 83.6 fL (ref 80.0–100.0)
MPV: 9.9 fL (ref 7.5–12.5)
Monocytes Relative: 6.1 %
Neutro Abs: 3819 cells/uL (ref 1500–7800)
Neutrophils Relative %: 62.6 %
Platelets: 237 10*3/uL (ref 140–400)
RBC: 4.57 10*6/uL (ref 3.80–5.10)
RDW: 13.3 % (ref 11.0–15.0)
Total Lymphocyte: 28.6 %
WBC: 6.1 10*3/uL (ref 3.8–10.8)

## 2018-11-10 LAB — COMPLETE METABOLIC PANEL WITH GFR
AG Ratio: 1.6 (calc) (ref 1.0–2.5)
ALT: 6 U/L (ref 6–29)
AST: 19 U/L (ref 10–35)
Albumin: 4.3 g/dL (ref 3.6–5.1)
Alkaline phosphatase (APISO): 61 U/L (ref 37–153)
BUN: 15 mg/dL (ref 7–25)
CO2: 30 mmol/L (ref 20–32)
Calcium: 9.4 mg/dL (ref 8.6–10.4)
Chloride: 103 mmol/L (ref 98–110)
Creat: 0.81 mg/dL (ref 0.60–0.93)
GFR, Est African American: 84 mL/min/{1.73_m2} (ref >=60)
GFR, Est Non African American: 72 mL/min/{1.73_m2} (ref >=60)
Globulin: 2.7 g/dL (calc) (ref 1.9–3.7)
Glucose, Bld: 78 mg/dL (ref 65–99)
Potassium: 4.1 mmol/L (ref 3.5–5.3)
Sodium: 140 mmol/L (ref 135–146)
Total Bilirubin: 0.5 mg/dL (ref 0.2–1.2)
Total Protein: 7 g/dL (ref 6.1–8.1)

## 2018-11-10 LAB — LIPID PANEL
Cholesterol: 213 mg/dL — ABNORMAL HIGH (ref <200)
HDL: 81 mg/dL (ref >=50)
LDL Cholesterol (Calc): 110 mg/dL (calc) — ABNORMAL HIGH
Non-HDL Cholesterol (Calc): 132 mg/dL (calc) — ABNORMAL HIGH (ref <130)
Total CHOL/HDL Ratio: 2.6 (calc) (ref <5.0)
Triglycerides: 110 mg/dL (ref <150)

## 2018-11-10 LAB — IRON, TOTAL/TOTAL IRON BINDING CAP
%SAT: 25 % (calc) (ref 16–45)
Iron: 81 ug/dL (ref 45–160)
TIBC: 323 mcg/dL (calc) (ref 250–450)

## 2018-11-10 LAB — VITAMIN B12: Vitamin B-12: 968 pg/mL (ref 200–1100)

## 2018-11-10 LAB — MAGNESIUM: Magnesium: 1.8 mg/dL (ref 1.5–2.5)

## 2018-11-10 LAB — HEMOGLOBIN A1C
Hgb A1c MFr Bld: 5.8 % of total Hgb — ABNORMAL HIGH (ref <5.7)
Mean Plasma Glucose: 120 (calc)
eAG (mmol/L): 6.6 (calc)

## 2018-11-10 LAB — TSH: TSH: 3.05 mIU/L (ref 0.40–4.50)

## 2018-11-14 ENCOUNTER — Telehealth: Payer: Self-pay | Admitting: Cardiovascular Disease

## 2018-11-14 NOTE — Telephone Encounter (Signed)
Per Dr. Johnsie Cancel, Can cancel appointment on 3/30 call patient we have not seen since 2017 and she has had normal eTT and cath in past.      Primary Cardiologist: Dr. Johnsie Cancel  Patient contacted.  History reviewed by Dr. Johnsie Cancel.  No symptoms to suggest any unstable cardiac conditions at this time. Patient stated her symptoms are the same as when she was at the hospital and all her test were fine and she does not think this is cardiac related  Based on discussion, with current pandemic situation, we will be postponing this appointment for [facility].  If symptoms change, she has been instructed to contact our office.  Rescheduled for 12/09/18   Michaelyn Barter, RN  11/14/2018 4:46 PM         .

## 2018-11-14 NOTE — Telephone Encounter (Signed)
New Message          Pam this is the only person that I rescheduled from Dr. Kyla Balzarine schedule on 03/30.

## 2018-11-21 ENCOUNTER — Ambulatory Visit: Payer: Medicare Other | Admitting: Cardiovascular Disease

## 2018-11-23 ENCOUNTER — Other Ambulatory Visit: Payer: Self-pay | Admitting: Physician Assistant

## 2018-12-09 ENCOUNTER — Ambulatory Visit: Payer: Medicare Other | Admitting: Cardiovascular Disease

## 2018-12-12 ENCOUNTER — Other Ambulatory Visit: Payer: Self-pay | Admitting: Adult Health

## 2018-12-12 DIAGNOSIS — R05 Cough: Secondary | ICD-10-CM

## 2018-12-12 DIAGNOSIS — R059 Cough, unspecified: Secondary | ICD-10-CM

## 2019-02-08 NOTE — Progress Notes (Deleted)
MEDICARE ANNUAL WELLNESS VISIT AND CPE  Assessment:   Labile hypertension - continue medications, DASH diet, exercise and monitor at home. Call if greater than 130/80.  -     CBC with Differential/Platelet -     BASIC METABOLIC PANEL WITH GFR -     Hepatic function panel -     TSH  Neuropathy Improved  Hyperlipidemia, unspecified hyperlipidemia type -continue medications, check lipids, decrease fatty foods, increase activity.  -     Lipid panel  Prediabetes Discussed general issues about diabetes pathophysiology and management., Educational material distributed., Suggested low cholesterol diet., Encouraged aerobic exercise., Discussed foot care., Reminded to get yearly retinal exam. -     Hemoglobin A1c  History of renal cell cancer monitor  Osteoporosis, unspecified osteoporosis type, unspecified pathological fracture presence continue Vit D and Ca, weight bearing exercises  Hiatal hernia Continue PPI/H2 blocker, diet discussed  Gastroesophageal reflux disease with esophagitis Continue PPI/H2 blocker, diet discussed  Anxiety continue medications, stress management techniques discussed, increase water, good sleep hygiene discussed, increase exercise, and increase veggies.   Vitamin D deficiency Continue supplement  Memory changes Follows Neuro Cut back on valium use  Medication management -     Magnesium  Encounter for Medicare annual wellness exam   Over 40 minutes of exam, counseling, chart review and critical decision making was performed Future Appointments  Date Time Provider [place]  02/13/2019 10:00 AM Vicie Mutters, PA-C [facility] None  05/12/2019 10:00 AM Josue Hector, MD [facility]  11/17/2019  9:30 AM Vicie Mutters, PA-C [facility] None  02/15/2020 10:00 AM Vicie Mutters, PA-C [facility] None     Plan:   During the course of the visit the patient was educated and counseled about appropriate screening and  preventive services including:    Pneumococcal vaccine   Prevnar 13  Influenza vaccine  Td vaccine  Screening electrocardiogram  Bone densitometry screening  Colorectal cancer screening  Diabetes screening  Glaucoma screening  Nutrition counseling   Advanced directives: requested   Subjective:  Kimberly Wilcox is a 73 y.o. female who presents for Medicare Annual Wellness Visit and complete physical.    She is higher risk with her age, HTN, atherosclerosis of the aorta, history of renal cell cancer so she canceled her trip next month. She is unable to get a refund at this time with [employer] and due to the medical co morbidities she does not want to reschedule and just wants a refund.   Her blood pressure has been controlled at home, today their BP is    She does workout, walking and states that her neuropathy has improved. She denies chest pain, shortness of breath, dizziness.  She is not on cholesterol medication and denies myalgias. Her cholesterol is at goal. The cholesterol last visit was:   Lab Results  Component Value Date   CHOL 213 (H) 11/09/2018   HDL 81 11/09/2018   LDLCALC 110 (H) 11/09/2018   TRIG 110 11/09/2018   CHOLHDL 2.6 11/09/2018   She has history of anxiety, can not tolerate SSRI, will take valium 1/2 pill, taking 1 pill every other day which is more than ususal due to coronavirus anxiety.    She has been working on diet and exercise for prediabetes, and denies paresthesia of the feet, polydipsia, polyuria and visual disturbances. Last A1C in the office was:  Lab Results  Component Value Date   HGBA1C 5.8 (H) 11/09/2018   Lab Results  Component Value Date  GFRNONAA 72 11/09/2018  Patient is on Vitamin D supplement.   Lab Results  Component Value Date   VD25OH 32 09/24/2017  BMI is There is no height or weight on file to calculate BMI., she is working on diet and exercise. Wt Readings from Last 3 Encounters:  11/09/18 147 lb  (66.7 kg)  10/24/18 145 lb (65.8 kg)  06/14/18 149 lb (67.6 kg)       Medication Review: Current Outpatient Medications on File Prior to Visit  Medication Sig Dispense Refill  . B Complex-C (SUPER B COMPLEX PO) Take 1 tablet by mouth daily.     . Cholecalciferol (VITAMIN D) 2000 units tablet Take 2,000 Units by mouth daily.     . diazepam (VALIUM) 2 MG tablet Take 1/2 to 1 tablet 2 to 3 x / day for acute anxiety &  please try to limit to 5 days /week to avoid addiction (Patient taking differently: Take 1-2 mg by mouth See admin instructions. Take 1-2 mg by mouth two to three times a day for acute anxiety and "please try to limit to 5 days a week to avoid addiction") 90 tablet 0  . fluticasone (FLONASE) 50 MCG/ACT nasal spray Place 2 sprays into both nostrils at bedtime. 16 g 3  . latanoprost (XALATAN) 0.005 % ophthalmic solution Place 1 drop into both eyes at bedtime.     . naproxen sodium (ALEVE) 220 MG tablet Take 220 mg by mouth 2 (two) times daily as needed (for headaches or pain).    . nitroGLYCERIN (NITROSTAT) 0.4 MG SL tablet Place 1 tablet (0.4 mg total) under the tongue every 5 (five) minutes as needed for chest pain. 25 tablet 4  . pantoprazole (PROTONIX) 40 MG tablet TAKE 1 TABLET(40 MG) BY MOUTH DAILY 90 tablet 1  . PROAIR HFA 108 (90 Base) MCG/ACT inhaler INHALE 2 PUFFS INTO THE LUNGS EVERY 4 HOURS AS NEEDED FOR WHEEZING OR SHORTNESS OF BREATH 8.5 g 3   No current facility-administered medications on file prior to visit.     Allergies  Allergen Reactions  . Meloxicam Itching  . Nitrofurantoin Itching and Other (See Comments)    "Flu-like" symptoms, also  . Nitrofurantoin Monohyd Macro Itching and Other (See Comments)    "Flu-like" symptoms, also  . Sulfa Antibiotics Hives and Itching    Current Problems (verified) Patient Active Problem List   Diagnosis Date Noted  . Atherosclerosis of aorta ([place]) 04/14/2018  . Neuropathy 01/09/2015  . Memory changes 01/09/2015  .  Hyperlipidemia 10/29/2014  . Abnormal glucose 10/29/2014  . History of renal cell cancer 10/29/2014  . Labile hypertension 10/29/2014  . Osteoporosis 10/29/2014  . Vitamin D deficiency   . GERD (gastroesophageal reflux disease)   . Anxiety   . Hiatal hernia     Screening Tests Immunization History  Administered Date(s) Administered  . Influenza, High Dose Seasonal PF 06/22/2017, 06/14/2018  . Influenza, Seasonal, Injecte, Preservative Fre 07/28/2016  . Pneumococcal Conjugate-13 09/17/2015  . Pneumococcal Polysaccharide-23 10/04/2012  . Td 10/04/2009   Preventative care: Last colonoscopy:01/2016, Dr. Amedeo Plenty EGD 2017 Last mammogram: 07/2018 Last pap smear/pelvic exam: 2011  remote DEXA: 10/2014 MRI brain 10/2014 Ct AB 64/4034 US carotids LICA <7425 [place] AB 9563 CXR 2017  Prior vaccinations: TD or Tdap: 2011  Influenza: 2019 Pneumococcal: 2014 Prevnar13: 2017 Shingles/Zostavax: declines  Names of Other Physician/Practitioners you currently use: 1. [facility] here for primary care 2. Dr. Katy Fitch, eye doctor, last visit April 2018  [place], dentist, last visit 2019 Patient Care Team: Unk Pinto, MD as PCP - General (Internal Medicine) Warden Fillers, MD as Consulting Physician (Optometry) Josue Hector, MD as Consulting Physician (Cardiology) Alexis Frock, MD as Consulting Physician (Urology) Teena Irani, MD (Inactive) as Consulting Physician (Gastroenterology)  SURGICAL HISTORY She  has a past surgical history that includes Nephrectomy (Left, 1999); Left oophorectomy (1985); and Tubal ligation. FAMILY HISTORY Her family history includes Other in her brother; Pneumonia in her father. SOCIAL HISTORY She  reports that she quit smoking about 34 years ago. She has never used smokeless tobacco. She reports current alcohol use. She reports that she does not use drugs.   MEDICARE WELLNESS OBJECTIVES: Physical activity:    Cardiac risk factors:   Depression/mood screen:   Depression screen [facility] 2/9 11/09/2018  Decreased Interest 0  Down, Depressed, Hopeless 0  PHQ - 2 Score 0    ADLs:  In your present state of health, do you have any difficulty performing the following activities: 11/09/2018 04/14/2018  Hearing? N N  Vision? N N  Difficulty concentrating or making decisions? N N  Walking or climbing stairs? N N  Dressing or bathing? N N  Doing errands, shopping? N N  Some recent data might be hidden     Cognitive Testing  Alert? Yes  Normal Appearance?Yes  Oriented to person? Yes  Place? Yes   Time? Yes  Recall of three objects?  Yes  Can perform simple calculations? Yes  Displays appropriate judgment?Yes  Can read the correct time from a watch face?Yes  EOL planning:    Review of Systems  Constitutional: Negative.   HENT: Negative.   Eyes: Negative for blurred vision, double vision, photophobia, pain, discharge and redness.  Respiratory: Negative.   Cardiovascular: Negative.   Gastrointestinal: Positive for abdominal pain. Negative for blood in stool, constipation, diarrhea, heartburn, melena, nausea and vomiting.  Genitourinary: Positive for frequency. Negative for dysuria, flank pain, hematuria and urgency.  Musculoskeletal: Negative.   Skin: Negative.   Neurological: Positive for sensory change (bilateral feet). Negative for dizziness, tingling, tremors, speech change, focal weakness, seizures and loss of consciousness.  Psychiatric/Behavioral: Negative for depression, hallucinations, memory loss, substance abuse and suicidal ideas. The patient is not nervous/anxious and does not have insomnia.      Objective:     There were no vitals filed for this visit. There is no height or weight on file to calculate BMI.  General appearance: alert, no distress, WD/WN, female HEENT: normocephalic, sclerae anicteric, TMs pearly, nares patent, no discharge or erythema, pharynx normal Oral cavity:  MMM, no lesions Neck: supple, no lymphadenopathy, no thyromegaly, no masses Heart: RRR, normal S1, S2, no murmurs Lungs: CTA bilaterally, no wheezes, rhonchi, or rales Abdomen: +bs, soft, non tender, non distended, no masses, no hepatomegaly, no splenomegaly Musculoskeletal: nontender, no swelling, no obvious deformity Extremities: no edema, no cyanosis, no clubbing Pulses: 2+ symmetric, upper and lower extremities, normal cap refill Neurological: alert, oriented x 3, CN2-12 intact, strength normal upper extremities and lower extremities, sensation decreased bilateral feet, DTRs 2+ throughout, no cerebellar signs, gait normal Psychiatric: normal affect, behavior normal, pleasant  GYN: + cystocele, no masses appreciated, + vaginal atrophy.   Medicare Attestation I have personally reviewed: The patient's medical and social history Their use of alcohol, tobacco or illicit drugs Their current medications and supplements The patient's functional ability including ADLs,fall risks, home safety risks, cognitive, and hearing and visual impairment Diet and physical activities Evidence for depression  or mood disorders  The patient's weight, height, BMI, and visual acuity have been recorded in the chart.  I have made referrals, counseling, and provided education to the patient based on review of the above and I have provided the patient with a written personalized care plan for preventive services.     Vicie Mutters, PA-C   02/08/2019

## 2019-02-13 ENCOUNTER — Encounter: Payer: Medicare Other | Admitting: Physician Assistant

## 2019-02-20 NOTE — Progress Notes (Signed)
CPE  Assessment:   Encounter for general adult medical examination with abnormal findings  Atherosclerosis of aorta ([place]) Control blood pressure, cholesterol, glucose, increase exercise.   Labile hypertension - continue medications, DASH diet, exercise and monitor at home. Call if greater than 130/80.  -     CBC with Differential/Platelet -     BASIC METABOLIC PANEL WITH GFR -     Hepatic function panel -     TSH  Neuropathy Improved  Hyperlipidemia, unspecified hyperlipidemia type -continue medications, check lipids, decrease fatty foods, increase activity.  -     Lipid panel  Abnormal glucose Discussed general issues about diabetes pathophysiology and management., Educational material distributed., Suggested low cholesterol diet., Encouraged aerobic exercise., Discussed foot care., Reminded to get yearly retinal exam. -     Hemoglobin A1c  History of renal cell cancer monitor  Osteoporosis, unspecified osteoporosis type, unspecified pathological fracture presence continue Vit D and Ca, weight bearing exercises DEXA WITH MGM IN DEC  Hiatal hernia Continue PPI/H2 blocker, diet discussed  Gastroesophageal reflux disease with esophagitis Continue PPI/H2 blocker, diet discussed  Anxiety continue medications, stress management techniques discussed, increase water, good sleep hygiene discussed, increase exercise, and increase veggies.   Vitamin D deficiency Continue supplement  Memory changes Follows Neuro Cut back on valium use  Medication management -     Magnesium   Over 40 minutes of exam, counseling, chart review and critical decision making was performed Future Appointments  Date Time Provider [place]  05/12/2019 10:00 AM Josue Hector, MD [facility]  11/17/2019  9:30 AM Vicie Mutters, PA-C [facility] None  02/28/2020 10:00 AM Vicie Mutters, PA-C [facility] None      Subjective:  Kimberly Wilcox is a 73 y.o. female  who presents for complete physical.    She has had a lot of anxiety, she normally travels but has not been able to. She has been very anxious. Can not tolerate SSRI, will take valium 1/2 pill, taking 1 pill every other day which is more than ususal due to coronavirus anxiety.   She continues to have AB discomfort, had CT 11/2017, showed possible IBD, also having some meat/bread getting caught. Will follow up with GI.   Her blood pressure has been controlled at home, today their BP is BP: 130/68  She does workout, walking and states that her neuropathy has improved. She denies chest pain, shortness of breath, dizziness.  She is not on cholesterol medication and denies myalgias. Her cholesterol is at goal. The cholesterol last visit was:   Lab Results  Component Value Date   CHOL 213 (H) 11/09/2018   HDL 81 11/09/2018   LDLCALC 110 (H) 11/09/2018   TRIG 110 11/09/2018   CHOLHDL 2.6 11/09/2018    She has been working on diet and exercise for prediabetes, and denies paresthesia of the feet, polydipsia, polyuria and visual disturbances. Last A1C in the office was:  Lab Results  Component Value Date   HGBA1C 5.8 (H) 11/09/2018   Lab Results  Component Value Date   GFRNONAA 72 11/09/2018  Patient is on Vitamin D supplement.   Lab Results  Component Value Date   VD25OH 32 09/24/2017  BMI is Body mass index is 28.04 kg/m., she is working on diet and exercise. Wt Readings from Last 3 Encounters:  02/22/19 148 lb 6.4 oz (67.3 kg)  11/09/18 147 lb (66.7 kg)  10/24/18 145 lb (65.8 kg)       Medication Review: Current Outpatient  Medications on File Prior to Visit  Medication Sig Dispense Refill  . B Complex-C (SUPER B COMPLEX PO) Take 1 tablet by mouth daily.     . Cholecalciferol (VITAMIN D) 2000 units tablet Take 2,000 Units by mouth daily.     . diazepam (VALIUM) 2 MG tablet Take 1/2 to 1 tablet 2 to 3 x / day for acute anxiety &  please try to limit to 5 days /week to avoid addiction  (Patient taking differently: Take 1-2 mg by mouth See admin instructions. Take 1-2 mg by mouth two to three times a day for acute anxiety and "please try to limit to 5 days a week to avoid addiction") 90 tablet 0  . fluticasone (FLONASE) 50 MCG/ACT nasal spray Place 2 sprays into both nostrils at bedtime. 16 g 3  . latanoprost (XALATAN) 0.005 % ophthalmic solution Place 1 drop into both eyes at bedtime.     . naproxen sodium (ALEVE) 220 MG tablet Take 220 mg by mouth 2 (two) times daily as needed (for headaches or pain).    . nitroGLYCERIN (NITROSTAT) 0.4 MG SL tablet Place 1 tablet (0.4 mg total) under the tongue every 5 (five) minutes as needed for chest pain. 25 tablet 4  . pantoprazole (PROTONIX) 40 MG tablet TAKE 1 TABLET(40 MG) BY MOUTH DAILY 90 tablet 1  . PROAIR HFA 108 (90 Base) MCG/ACT inhaler INHALE 2 PUFFS INTO THE LUNGS EVERY 4 HOURS AS NEEDED FOR WHEEZING OR SHORTNESS OF BREATH 8.5 g 3   No current facility-administered medications on file prior to visit.     Allergies  Allergen Reactions  . Meloxicam Itching  . Nitrofurantoin Itching and Other (See Comments)    "Flu-like" symptoms, also  . Nitrofurantoin Monohyd Macro Itching and Other (See Comments)    "Flu-like" symptoms, also  . Sulfa Antibiotics Hives and Itching    Current Problems (verified) Patient Active Problem List   Diagnosis Date Noted  . Atherosclerosis of aorta ([place]) 04/14/2018  . Neuropathy 01/09/2015  . Memory changes 01/09/2015  . Hyperlipidemia 10/29/2014  . Abnormal glucose 10/29/2014  . History of renal cell cancer 10/29/2014  . Labile hypertension 10/29/2014  . Osteoporosis 10/29/2014  . Vitamin D deficiency   . GERD (gastroesophageal reflux disease)   . Anxiety   . Hiatal hernia     Screening Tests Immunization History  Administered Date(s) Administered  . Influenza, High Dose Seasonal PF 06/22/2017, 06/14/2018  . Influenza, Seasonal, Injecte, Preservative Fre 07/28/2016  . Pneumococcal  Conjugate-13 09/17/2015  . Pneumococcal Polysaccharide-23 10/04/2012  . Td 10/04/2009   Preventative care: Last colonoscopy:01/2016, Dr. Amedeo Plenty EGD 2017 Last mammogram: 07/2018 Last pap smear/pelvic exam: 2011  remote DEXA: 10/2014 osteopenia MRI brain 10/2014 Ct AB 10/4740 US carotids LICA <5956 [place] AB 3875 CXR 2017  Prior vaccinations: TD or Tdap: 2011  Influenza: 2019 Pneumococcal: 2014 Prevnar13: 2017 Shingles/Zostavax: declines  Names of Other Physician/Practitioners you currently use: 1. [facility] here for primary care 2. Dr. Katy Fitch, eye doctor, last visit April 2018 3. Gwyndolyn Saxon, dentist, last visit 2019 Patient Care Team: Unk Pinto, MD as PCP - General (Internal Medicine) Warden Fillers, MD as Consulting Physician (Optometry) Josue Hector, MD as Consulting Physician (Cardiology) Alexis Frock, MD as Consulting Physician (Urology) Teena Irani, MD (Inactive) as Consulting Physician (Gastroenterology)  SURGICAL HISTORY She  has a past surgical history that includes Nephrectomy (Left, 1999); Left oophorectomy (1985); and Tubal ligation. FAMILY HISTORY Her family history includes Other in  her brother; Pneumonia in her father. SOCIAL HISTORY She  reports that she quit smoking about 34 years ago. She has never used smokeless tobacco. She reports current alcohol use. She reports that she does not use drugs.    Review of Systems  Constitutional: Negative.   HENT: Negative.   Eyes: Negative for blurred vision, double vision, photophobia, pain, discharge and redness.  Respiratory: Negative.   Cardiovascular: Negative.   Gastrointestinal: Positive for abdominal pain. Negative for blood in stool, constipation, diarrhea, heartburn, melena, nausea and vomiting.  Genitourinary: Positive for frequency. Negative for dysuria, flank pain, hematuria and urgency.  Musculoskeletal: Negative.   Skin: Negative.   Neurological:  Positive for sensory change (bilateral feet). Negative for dizziness, tingling, tremors, speech change, focal weakness, seizures and loss of consciousness.  Psychiatric/Behavioral: Negative for depression, hallucinations, memory loss, substance abuse and suicidal ideas. The patient is not nervous/anxious and does not have insomnia.      Objective:     Today's Vitals   02/22/19 1053  BP: 130/68  Pulse: 75  Temp: 97.9 F (36.6 C)  SpO2: 97%  Weight: 148 lb 6.4 oz (67.3 kg)  Height: 5\' 1"  (1.549 m)   Body mass index is 28.04 kg/m.  General appearance: alert, no distress, WD/WN, female HEENT: normocephalic, sclerae anicteric, TMs pearly, nares patent, no discharge or erythema, pharynx normal Oral cavity: MMM, no lesions Neck: supple, no lymphadenopathy, no thyromegaly, no masses Heart: RRR, normal S1, S2, no murmurs Lungs: CTA bilaterally, no wheezes, rhonchi, or rales Abdomen: +bs, soft, diffuse tenderness, non distended, no masses, no hepatomegaly, no splenomegaly Musculoskeletal: nontender, no swelling, no obvious deformity Extremities: no edema, no cyanosis, no clubbing Pulses: 2+ symmetric, upper and lower extremities, normal cap refill Neurological: alert, oriented x 3, CN2-12 intact, strength normal upper extremities and lower extremities, sensation decreased bilateral feet, DTRs 2+ throughout, no cerebellar signs, gait normal Psychiatric: normal affect, behavior normal, pleasant    Vicie Mutters, PA-C   02/22/2019

## 2019-02-22 ENCOUNTER — Encounter: Payer: Self-pay | Admitting: Physician Assistant

## 2019-02-22 ENCOUNTER — Ambulatory Visit (INDEPENDENT_AMBULATORY_CARE_PROVIDER_SITE_OTHER): Payer: Medicare Other | Admitting: Physician Assistant

## 2019-02-22 ENCOUNTER — Other Ambulatory Visit: Payer: Self-pay

## 2019-02-22 VITALS — BP 130/68 | HR 75 | Temp 97.9°F | Ht 61.0 in | Wt 148.4 lb

## 2019-02-22 DIAGNOSIS — Z0000 Encounter for general adult medical examination without abnormal findings: Secondary | ICD-10-CM | POA: Diagnosis not present

## 2019-02-22 DIAGNOSIS — Z0001 Encounter for general adult medical examination with abnormal findings: Secondary | ICD-10-CM

## 2019-02-22 DIAGNOSIS — R0989 Other specified symptoms and signs involving the circulatory and respiratory systems: Secondary | ICD-10-CM | POA: Diagnosis not present

## 2019-02-22 DIAGNOSIS — I700 Atherosclerosis of aorta: Secondary | ICD-10-CM | POA: Diagnosis not present

## 2019-02-22 DIAGNOSIS — E785 Hyperlipidemia, unspecified: Secondary | ICD-10-CM

## 2019-02-22 DIAGNOSIS — M810 Age-related osteoporosis without current pathological fracture: Secondary | ICD-10-CM

## 2019-02-22 DIAGNOSIS — Z136 Encounter for screening for cardiovascular disorders: Secondary | ICD-10-CM

## 2019-02-22 DIAGNOSIS — K449 Diaphragmatic hernia without obstruction or gangrene: Secondary | ICD-10-CM

## 2019-02-22 DIAGNOSIS — K2100 Gastro-esophageal reflux disease with esophagitis, without bleeding: Secondary | ICD-10-CM

## 2019-02-22 DIAGNOSIS — R413 Other amnesia: Secondary | ICD-10-CM

## 2019-02-22 DIAGNOSIS — R7309 Other abnormal glucose: Secondary | ICD-10-CM

## 2019-02-22 DIAGNOSIS — E559 Vitamin D deficiency, unspecified: Secondary | ICD-10-CM

## 2019-02-22 DIAGNOSIS — G629 Polyneuropathy, unspecified: Secondary | ICD-10-CM

## 2019-02-22 DIAGNOSIS — F419 Anxiety disorder, unspecified: Secondary | ICD-10-CM

## 2019-02-22 DIAGNOSIS — Z85528 Personal history of other malignant neoplasm of kidney: Secondary | ICD-10-CM

## 2019-02-22 DIAGNOSIS — Z79899 Other long term (current) drug therapy: Secondary | ICD-10-CM

## 2019-02-23 LAB — URINALYSIS, ROUTINE W REFLEX MICROSCOPIC
Bacteria, UA: NONE SEEN /HPF
Bilirubin Urine: NEGATIVE
Glucose, UA: NEGATIVE
Hgb urine dipstick: NEGATIVE
Hyaline Cast: NONE SEEN /LPF
Ketones, ur: NEGATIVE
Nitrite: NEGATIVE
Protein, ur: NEGATIVE
RBC / HPF: NONE SEEN /HPF (ref 0–2)
Specific Gravity, Urine: 1.007 (ref 1.001–1.03)
Squamous Epithelial / HPF: NONE SEEN /HPF (ref <=5)
pH: 6 (ref 5.0–8.0)

## 2019-02-23 LAB — TSH: TSH: 2.38 mIU/L (ref 0.40–4.50)

## 2019-02-23 LAB — CBC WITH DIFFERENTIAL/PLATELET
Absolute Monocytes: 447 cells/uL (ref 200–950)
Basophils Absolute: 46 cells/uL (ref 0–200)
Basophils Relative: 0.6 %
Eosinophils Absolute: 131 cells/uL (ref 15–500)
Eosinophils Relative: 1.7 %
HCT: 39.5 % (ref 35.0–45.0)
Hemoglobin: 13 g/dL (ref 11.7–15.5)
Lymphs Abs: 1956 cells/uL (ref 850–3900)
MCH: 27.6 pg (ref 27.0–33.0)
MCHC: 32.9 g/dL (ref 32.0–36.0)
MCV: 83.9 fL (ref 80.0–100.0)
MPV: 9.7 fL (ref 7.5–12.5)
Monocytes Relative: 5.8 %
Neutro Abs: 5121 cells/uL (ref 1500–7800)
Neutrophils Relative %: 66.5 %
Platelets: 293 10*3/uL (ref 140–400)
RBC: 4.71 10*6/uL (ref 3.80–5.10)
RDW: 13 % (ref 11.0–15.0)
Total Lymphocyte: 25.4 %
WBC: 7.7 10*3/uL (ref 3.8–10.8)

## 2019-02-23 LAB — COMPLETE METABOLIC PANEL WITH GFR
AG Ratio: 1.4 (calc) (ref 1.0–2.5)
ALT: 12 U/L (ref 6–29)
AST: 27 U/L (ref 10–35)
Albumin: 4.2 g/dL (ref 3.6–5.1)
Alkaline phosphatase (APISO): 81 U/L (ref 37–153)
BUN: 18 mg/dL (ref 7–25)
CO2: 28 mmol/L (ref 20–32)
Calcium: 9.7 mg/dL (ref 8.6–10.4)
Chloride: 104 mmol/L (ref 98–110)
Creat: 0.84 mg/dL (ref 0.60–0.93)
GFR, Est African American: 79 mL/min/{1.73_m2} (ref >=60)
GFR, Est Non African American: 68 mL/min/{1.73_m2} (ref >=60)
Globulin: 3 g/dL (calc) (ref 1.9–3.7)
Glucose, Bld: 91 mg/dL (ref 65–99)
Potassium: 4.4 mmol/L (ref 3.5–5.3)
Sodium: 140 mmol/L (ref 135–146)
Total Bilirubin: 0.4 mg/dL (ref 0.2–1.2)
Total Protein: 7.2 g/dL (ref 6.1–8.1)

## 2019-02-23 LAB — LIPID PANEL
Cholesterol: 211 mg/dL — ABNORMAL HIGH (ref <200)
HDL: 79 mg/dL (ref >=50)
LDL Cholesterol (Calc): 113 mg/dL (calc) — ABNORMAL HIGH
Non-HDL Cholesterol (Calc): 132 mg/dL (calc) — ABNORMAL HIGH (ref <130)
Total CHOL/HDL Ratio: 2.7 (calc) (ref <5.0)
Triglycerides: 89 mg/dL (ref <150)

## 2019-02-23 LAB — MICROALBUMIN / CREATININE URINE RATIO
Creatinine, Urine: 26 mg/dL (ref 20–275)
Microalb, Ur: <0.2 mg/dL

## 2019-02-23 LAB — MAGNESIUM: Magnesium: 2 mg/dL (ref 1.5–2.5)

## 2019-02-23 LAB — VITAMIN D 25 HYDROXY (VIT D DEFICIENCY, FRACTURES): Vit D, 25-Hydroxy: 45 ng/mL (ref 30–100)

## 2019-03-28 ENCOUNTER — Other Ambulatory Visit: Payer: Self-pay | Admitting: Physician Assistant

## 2019-03-28 DIAGNOSIS — Z1231 Encounter for screening mammogram for malignant neoplasm of breast: Secondary | ICD-10-CM

## 2019-04-27 ENCOUNTER — Ambulatory Visit: Payer: Self-pay | Admitting: Physician Assistant

## 2019-05-03 NOTE — Progress Notes (Deleted)
CARDIOLOGY CONSULT NOTE       Patient ID: CAPPIE GLOCKNER MRN: [id number] DOB/AGE: [birth year] 73 y.o.  Admit date: (Not on file) Referring Physician: Vicie Mutters PA-C Primary Physician: Unk Pinto, MD Primary Cardiologist: Johnsie Cancel Reason for Consultation: CRF;s Chest pain   Active Problems:   * No active hospital problems. *   HPI:  73 y.o. with HLD, HTN,  and anxiety worse during [place] pandemic. Uses valium Intolerant to SSRI Had cath in 2013 with normal coronary arteries Atypical pain 2017 with normal ETT. Has GERD and renal cell cancer post nephrectomy with normal Cr 02/22/19.  LDL 113 not on statin A1c only 5.8 with diet. She is having recurrent left sided chest pains  ***  ROS All other systems reviewed and negative except as noted above  Past Medical History:  Diagnosis Date  . Anxiety   . Chest discomfort   . GERD (gastroesophageal reflux disease)   . Hiatal hernia   . Hypertension    labile, normal cath 2013  . Osteoporosis   . Renal cell cancer ([place]) 1999   LEFT NEPHRECTOMY  . Vitamin D deficiency     Family History  Problem Relation Age of Onset  . Pneumonia Father   . Other Brother        Brain disease    Social History   Socioeconomic History  . Marital status: Widowed    Spouse name: Not on file  . Number of children: 4  . Years of education: Not on file  . Highest education level: Not on file  Occupational History  . Occupation: Insurance claims handler  Social Needs  . Financial resource strain: Not on file  . Food insecurity    Worry: Not on file    Inability: Not on file  . Transportation needs    Medical: Not on file    Non-medical: Not on file  Tobacco Use  . Smoking status: Former Smoker    Quit date: 10/28/1984    Years since quitting: 34.5  . Smokeless tobacco: Never Used  Substance and Sexual Activity  . Alcohol use: Yes    Alcohol/week: 0.0 standard drinks    Comment: wine 2-3 glasses once a month  . Drug use: No  .  Sexual activity: Not Currently    Comment: 1st intercourse 8 yo-5 partners  Lifestyle  . Physical activity    Days per week: Not on file    Minutes per session: Not on file  . Stress: Not on file  Relationships  . Social Herbalist on phone: Not on file    Gets together: Not on file    Attends religious service: Not on file    Active member of club or organization: Not on file    Attends meetings of clubs or organizations: Not on file    Relationship status: Not on file  . Intimate partner violence    Fear of current or ex partner: Not on file    Emotionally abused: Not on file    Physically abused: Not on file    Forced sexual activity: Not on file  Other Topics Concern  . Not on file  Social History Narrative  . Not on file    Past Surgical History:  Procedure Laterality Date  . LEFT OOPHORECTOMY  1985   Benign tumor  . NEPHRECTOMY Left 1999   secondary to cancer  . TUBAL LIGATION          Physical Exam:  There were no vitals taken for this visit. *** HELP TEXT ***  This SmartLink requires parameters. Parameters are variables that are added to the [facility] name to request specific information. The parameter for .curwt is the number of readings to display.  For example: .curwt[4  In this example, the SmartLink displays the last four encounter readings.    {physical [id number]  Labs:   Lab Results  Component Value Date   WBC 7.7 02/22/2019   HGB 13.0 02/22/2019   HCT 39.5 02/22/2019   MCV 83.9 02/22/2019   PLT 293 02/22/2019   No results for input(s): NA, K, CL, CO2, BUN, CREATININE, CALCIUM, PROT, BILITOT, ALKPHOS, ALT, AST, GLUCOSE in the last 168 hours.  Invalid input(s): LABALBU Lab Results  Component Value Date   CKTOTAL 56 10/29/2014   TROPONINI <0.01 06/14/2018    Lab Results  Component Value Date   CHOL 211 (H) 02/22/2019   CHOL 213 (H) 11/09/2018   CHOL 239 (H) 04/14/2018   Lab Results  Component Value Date   HDL 79  02/22/2019   HDL 81 11/09/2018   HDL 94 04/14/2018   Lab Results  Component Value Date   LDLCALC 113 (H) 02/22/2019   LDLCALC 110 (H) 11/09/2018   LDLCALC 122 (H) 04/14/2018   Lab Results  Component Value Date   TRIG 89 02/22/2019   TRIG 110 11/09/2018   TRIG 118 04/14/2018   Lab Results  Component Value Date   CHOLHDL 2.7 02/22/2019   CHOLHDL 2.6 11/09/2018   CHOLHDL 2.5 04/14/2018   No results found for: LDLDIRECT    Radiology: No results found.  EKG: SR normal ECG 10/25/18   ASSESSMENT AND PLAN:   1. Chest Pain: normal cath 2013 and normal ETT 2017 with normal ECG *** 2. HTN:  Well controlled.  Continue current medications and low sodium Dash type diet.   3. HLD:  Start low dose station f/u with primary  4. Anxiety:  Continue PRN valium f/u primary  5. GERD: on protonix noted CT abdomen 12/10/17 suggested IBD consider f/u GI  Signed: Jenkins Rouge 05/03/2019, 3:00 PM

## 2019-05-12 ENCOUNTER — Ambulatory Visit: Payer: Medicare Other | Admitting: Cardiovascular Disease

## 2019-05-23 NOTE — Progress Notes (Signed)
CARDIOLOGY CONSULT NOTE       Patient ID: Kimberly Wilcox MRN: [id number] DOB/AGE: September 12, 1945 73 y.o.  Admit date: (Not on file) Referring Physician: Melford Aase Primary Physician: Unk Pinto, MD Primary Cardiologist: New Reason for Consultation: History of chest pain CRF;s   Active Problems:   * No active hospital problems. *   HPI:  73 y.o. HLD, HTN DM-2 .  Anxiety worse with COVID travel restrictions. History of GERD and GI issues IBS and dysphagia. Last seen by Cardiology 2017 with atypical pain Normal cath in 2013 Normal ETT 2017.  Post left nephrectomy for renal cell carcinoma with normal baseline Cr   She has not had any symptoms since January No chest pain   ROS All other systems reviewed and negative except as noted above  Past Medical History:  Diagnosis Date  . Anxiety   . Chest discomfort   . GERD (gastroesophageal reflux disease)   . Hiatal hernia   . Hypertension    labile, normal cath 2013  . Osteoporosis   . Renal cell cancer ([place]) 1999   LEFT NEPHRECTOMY  . Vitamin D deficiency     Family History  Problem Relation Age of Onset  . Pneumonia Father   . Other Brother        Brain disease    Social History   Socioeconomic History  . Marital status: Widowed    Spouse name: Not on file  . Number of children: 4  . Years of education: Not on file  . Highest education level: Not on file  Occupational History  . Occupation: Insurance claims handler  Social Needs  . Financial resource strain: Not on file  . Food insecurity    Worry: Not on file    Inability: Not on file  . Transportation needs    Medical: Not on file    Non-medical: Not on file  Tobacco Use  . Smoking status: Former Smoker    Quit date: 10/28/1984    Years since quitting: 34.5  . Smokeless tobacco: Never Used  Substance and Sexual Activity  . Alcohol use: Yes    Alcohol/week: 0.0 standard drinks    Comment: wine 2-3 glasses once a month  . Drug use: No  . Sexual activity: Not  Currently    Comment: 1st intercourse 68 yo-5 partners  Lifestyle  . Physical activity    Days per week: Not on file    Minutes per session: Not on file  . Stress: Not on file  Relationships  . Social Herbalist on phone: Not on file    Gets together: Not on file    Attends religious service: Not on file    Active member of club or organization: Not on file    Attends meetings of clubs or organizations: Not on file    Relationship status: Not on file  . Intimate partner violence    Fear of current or ex partner: Not on file    Emotionally abused: Not on file    Physically abused: Not on file    Forced sexual activity: Not on file  Other Topics Concern  . Not on file  Social History Narrative  . Not on file    Past Surgical History:  Procedure Laterality Date  . LEFT OOPHORECTOMY  1985   Benign tumor  . NEPHRECTOMY Left 1999   secondary to cancer  . TUBAL LIGATION          Physical Exam:  BP (!) 148/86   Pulse 74   Ht 5\' 1"  (1.549 m)   Wt 148 lb (67.1 kg)   SpO2 95%   BMI 27.96 kg/m  Affect appropriate Healthy:  appears stated age 61: normal Neck supple with no adenopathy JVP normal no bruits no thyromegaly Lungs clear with no wheezing and good diaphragmatic motion Heart:  S1/S2 no murmur, no rub, gallop or click PMI normal Abdomen: benighn, BS positve, no tenderness, no AAA no bruit.  No HSM or HJR Distal pulses intact with no bruits No edema Neuro non-focal Skin warm and dry No muscular weakness   Labs:   Lab Results  Component Value Date   WBC 7.7 02/22/2019   HGB 13.0 02/22/2019   HCT 39.5 02/22/2019   MCV 83.9 02/22/2019   PLT 293 02/22/2019   No results for input(s): NA, K, CL, CO2, BUN, CREATININE, CALCIUM, PROT, BILITOT, ALKPHOS, ALT, AST, GLUCOSE in the last 168 hours.  Invalid input(s): LABALBU Lab Results  Component Value Date   CKTOTAL 56 10/29/2014   TROPONINI <0.01 06/14/2018    Lab Results  Component Value  Date   CHOL 211 (H) 02/22/2019   CHOL 213 (H) 11/09/2018   CHOL 239 (H) 04/14/2018   Lab Results  Component Value Date   HDL 79 02/22/2019   HDL 81 11/09/2018   HDL 94 04/14/2018   Lab Results  Component Value Date   LDLCALC 113 (H) 02/22/2019   LDLCALC 110 (H) 11/09/2018   LDLCALC 122 (H) 04/14/2018   Lab Results  Component Value Date   TRIG 89 02/22/2019   TRIG 110 11/09/2018   TRIG 118 04/14/2018   Lab Results  Component Value Date   CHOLHDL 2.7 02/22/2019   CHOLHDL 2.6 11/09/2018   CHOLHDL 2.5 04/14/2018   No results found for: LDLDIRECT    Radiology: No results found.  EKG: 10/25/18 SR rate 71 normal ECG    ASSESSMENT AND PLAN:   1. HTN: Well controlled.  Continue current medications and low sodium Dash type diet.   2. HLD:  LDL 113 not on statin observe  3. DM:  Discussed low carb diet.  Target hemoglobin A1c is 6.5 or less.  Continue current medications. 4. Chest Pain:  History of with normal ETT 2017 and normal cath in 2013 Resolved offered her stress testing and she deferred which I think is reasonable since symptoms gone  5. Anxiety:  Worse with COVID travel restrictions PRN valium   Signed: Jenkins Rouge 05/24/2019, 3:21 PM

## 2019-05-24 ENCOUNTER — Ambulatory Visit: Payer: Medicare Other | Admitting: Cardiovascular Disease

## 2019-05-24 ENCOUNTER — Encounter (INDEPENDENT_AMBULATORY_CARE_PROVIDER_SITE_OTHER): Payer: Self-pay

## 2019-05-24 ENCOUNTER — Other Ambulatory Visit: Payer: Self-pay

## 2019-05-24 ENCOUNTER — Encounter: Payer: Self-pay | Admitting: Cardiovascular Disease

## 2019-05-24 VITALS — BP 148/86 | HR 74 | Ht 61.0 in | Wt 148.0 lb

## 2019-05-24 DIAGNOSIS — E785 Hyperlipidemia, unspecified: Secondary | ICD-10-CM | POA: Diagnosis not present

## 2019-05-24 DIAGNOSIS — E119 Type 2 diabetes mellitus without complications: Secondary | ICD-10-CM | POA: Diagnosis not present

## 2019-05-24 DIAGNOSIS — R079 Chest pain, unspecified: Secondary | ICD-10-CM

## 2019-05-24 DIAGNOSIS — I10 Essential (primary) hypertension: Secondary | ICD-10-CM | POA: Diagnosis not present

## 2019-05-24 DIAGNOSIS — F419 Anxiety disorder, unspecified: Secondary | ICD-10-CM

## 2019-05-24 NOTE — Patient Instructions (Signed)
Medication Instructions:   If you need a refill on your cardiac medications before your next appointment, please call your pharmacy.   Lab work:  If you have labs (blood work) drawn today and your tests are completely normal, you will receive your results only by: . MyChart Message (if you have MyChart) OR . A paper copy in the mail If you have any lab test that is abnormal or we need to change your treatment, we will call you to review the results.  Testing/Procedures: None ordered today.   Follow-Up: At [facility], you and your health needs are our priority.  As part of our continuing mission to provide you with exceptional heart care, we have created designated Provider Care Teams.  These Care Teams include your primary Cardiologist (physician) and Advanced Practice Providers (APPs -  Physician Assistants and Nurse Practitioners) who all work together to provide you with the care you need, when you need it. You will need a follow up appointment in 1 years.  Please call our office 2 months in advance to schedule this appointment.  You may see Dr. Nishan or one of the following Advanced Practice Providers on your designated Care Team:   Lori Gerhardt, NP Laura Ingold, NP . Jill McDaniel, NP  

## 2019-06-01 ENCOUNTER — Encounter: Payer: Self-pay | Admitting: Gynecology

## 2019-06-19 ENCOUNTER — Ambulatory Visit (INDEPENDENT_AMBULATORY_CARE_PROVIDER_SITE_OTHER): Payer: Medicare Other

## 2019-06-19 ENCOUNTER — Other Ambulatory Visit: Payer: Self-pay

## 2019-06-19 VITALS — Temp 97.5°F

## 2019-06-19 DIAGNOSIS — Z23 Encounter for immunization: Secondary | ICD-10-CM | POA: Diagnosis not present

## 2019-06-19 NOTE — Progress Notes (Signed)
REPORTS for HD flu.

## 2019-07-03 ENCOUNTER — Other Ambulatory Visit: Payer: Self-pay

## 2019-07-03 DIAGNOSIS — Z20822 Contact with and (suspected) exposure to covid-19: Secondary | ICD-10-CM

## 2019-07-04 LAB — NOVEL CORONAVIRUS, NAA: SARS-CoV-2, NAA: NOT DETECTED

## 2019-07-06 ENCOUNTER — Telehealth: Payer: Self-pay | Admitting: Internal Medicine

## 2019-07-06 NOTE — Telephone Encounter (Signed)
Negative COVID results given. Patient results "NOT Detected." Caller expressed understanding. ° °

## 2019-07-12 ENCOUNTER — Other Ambulatory Visit: Payer: Self-pay

## 2019-07-12 DIAGNOSIS — Z20822 Contact with and (suspected) exposure to covid-19: Secondary | ICD-10-CM

## 2019-07-14 LAB — NOVEL CORONAVIRUS, NAA: SARS-CoV-2, NAA: NOT DETECTED

## 2019-07-25 ENCOUNTER — Emergency Department (HOSPITAL_COMMUNITY)
Admission: EM | Admit: 2019-07-25 | Discharge: 2019-07-25 | Disposition: A | Discharge status: Home / Self Care | Payer: Medicare Other | Attending: Emergency Medicine | Admitting: Emergency Medicine

## 2019-07-25 ENCOUNTER — Other Ambulatory Visit: Payer: Self-pay

## 2019-07-25 ENCOUNTER — Emergency Department (HOSPITAL_COMMUNITY): Payer: Medicare Other

## 2019-07-25 DIAGNOSIS — Z79899 Other long term (current) drug therapy: Secondary | ICD-10-CM | POA: Insufficient documentation

## 2019-07-25 DIAGNOSIS — Z87891 Personal history of nicotine dependence: Secondary | ICD-10-CM | POA: Insufficient documentation

## 2019-07-25 DIAGNOSIS — I10 Essential (primary) hypertension: Secondary | ICD-10-CM | POA: Insufficient documentation

## 2019-07-25 DIAGNOSIS — R1013 Epigastric pain: Secondary | ICD-10-CM | POA: Insufficient documentation

## 2019-07-25 LAB — COMPREHENSIVE METABOLIC PANEL
ALT: 10 U/L (ref 0–44)
AST: 22 U/L (ref 15–41)
Albumin: 4.5 g/dL (ref 3.5–5.0)
Alkaline Phosphatase: 67 U/L (ref 38–126)
Anion gap: 16 — ABNORMAL HIGH (ref 5–15)
BUN: 11 mg/dL (ref 8–23)
CO2: 24 mmol/L (ref 22–32)
Calcium: 10 mg/dL (ref 8.9–10.3)
Chloride: 99 mmol/L (ref 98–111)
Creatinine, Ser: 0.83 mg/dL (ref 0.44–1.00)
GFR calc Af Amer: >60 mL/min (ref >60)
GFR calc non Af Amer: >60 mL/min (ref >60)
Glucose, Bld: 99 mg/dL (ref 70–99)
Potassium: 3.8 mmol/L (ref 3.5–5.1)
Sodium: 139 mmol/L (ref 135–145)
Total Bilirubin: 0.7 mg/dL (ref 0.3–1.2)
Total Protein: 8 g/dL (ref 6.5–8.1)

## 2019-07-25 LAB — URINALYSIS, ROUTINE W REFLEX MICROSCOPIC
Bilirubin Urine: NEGATIVE
Glucose, UA: NEGATIVE mg/dL
Hgb urine dipstick: NEGATIVE
Ketones, ur: NEGATIVE mg/dL
Nitrite: NEGATIVE
Protein, ur: NEGATIVE mg/dL
Specific Gravity, Urine: 1.003 — ABNORMAL LOW (ref 1.005–1.030)
pH: 6 (ref 5.0–8.0)

## 2019-07-25 LAB — TROPONIN I (HIGH SENSITIVITY)
Troponin I (High Sensitivity): 3 ng/L (ref <18)
Troponin I (High Sensitivity): 3 ng/L (ref <18)

## 2019-07-25 LAB — CBC
HCT: 43.1 % (ref 36.0–46.0)
Hemoglobin: 13.8 g/dL (ref 12.0–15.0)
MCH: 27.3 pg (ref 26.0–34.0)
MCHC: 32 g/dL (ref 30.0–36.0)
MCV: 85.2 fL (ref 80.0–100.0)
Platelets: 248 10*3/uL (ref 150–400)
RBC: 5.06 MIL/uL (ref 3.87–5.11)
RDW: 13.8 % (ref 11.5–15.5)
WBC: 6.3 10*3/uL (ref 4.0–10.5)
nRBC: 0 % (ref 0.0–0.2)

## 2019-07-25 LAB — LIPASE, BLOOD: Lipase: 28 U/L (ref 11–51)

## 2019-07-25 MED ORDER — IOHEXOL 300 MG/ML  SOLN
100.0000 mL | Freq: Once | INTRAMUSCULAR | Status: AC | PRN
  Administered 2019-07-25: 80 mL via INTRAVENOUS

## 2019-07-25 MED ORDER — SODIUM CHLORIDE 0.9 % IV BOLUS
1000.0000 mL | Freq: Once | INTRAVENOUS | Status: AC
  Administered 2019-07-25: 1000 mL via INTRAVENOUS

## 2019-07-25 NOTE — ED Notes (Signed)
Got patient into the gown patient is resting with call bell in reach

## 2019-07-25 NOTE — ED Triage Notes (Addendum)
For two days, pt has had upper abdominal/epigastric pain. Hx hiatal hernia. Pt sts this pain is different than her normal pain though, pain worse with lying down and at night, radiates under L breast and around to her back. Pt endorses nausea, but denies vomiting or shob.

## 2019-07-25 NOTE — Discharge Instructions (Signed)
Follow-up with gastroenterology if symptoms or not improving in the next few days.  The contact information for [facility] has been provided in this discharge summary for you to call and make these arrangements.  Return to the emergency department in the meantime if you develop worsening pain, high fever, bloody stool, or other new and concerning symptoms.

## 2019-07-25 NOTE — ED Provider Notes (Signed)
[facility] EMERGENCY DEPARTMENT Provider Note   CSN: [id number] Arrival date & time: 07/25/19  1139     History   Chief Complaint Chief Complaint  Patient presents with  . Abdominal Pain    HPI Kimberly Wilcox is a 73 y.o. female.     Patient is a 73 year old female with past medical history of hypertension, prior left nephrectomy secondary to renal mass.  She presents today for evaluation of abdominal pain.  Patient describes epigastric and left upper quadrant discomfort radiating to her back.  This has been occurring intermittently for nearly a week.  She denies any aggravating or alleviating factors.  She denies any fevers or chills.  The history is provided by the patient.  Abdominal Pain Pain location:  Epigastric and LUQ Pain quality: cramping   Pain radiates to:  Does not radiate Pain severity:  Moderate Onset quality:  Sudden Timing:  Constant Progression:  Worsening Chronicity:  New Relieved by:  Nothing Worsened by:  Nothing Ineffective treatments:  None tried   Past Medical History:  Diagnosis Date  . Anxiety   . Chest discomfort   . GERD (gastroesophageal reflux disease)   . Hiatal hernia   . Hypertension    labile, normal cath 2013  . Osteoporosis   . Renal cell cancer ([place]) 1999   LEFT NEPHRECTOMY  . Vitamin D deficiency     Patient Active Problem List   Diagnosis Date Noted  . Atherosclerosis of aorta ([place]) 04/14/2018  . Neuropathy 01/09/2015  . Memory changes 01/09/2015  . Hyperlipidemia 10/29/2014  . Abnormal glucose 10/29/2014  . History of renal cell cancer 10/29/2014  . Labile hypertension 10/29/2014  . Osteoporosis 10/29/2014  . Vitamin D deficiency   . GERD (gastroesophageal reflux disease)   . Anxiety   . Hiatal hernia     Past Surgical History:  Procedure Laterality Date  . LEFT OOPHORECTOMY  1985   Benign tumor  . NEPHRECTOMY Left 1999   secondary to cancer  . TUBAL LIGATION       OB History     Gravida  4   Para  4   Term      Preterm      AB      Living  4     SAB      TAB      Ectopic      Multiple      Live Births               Home Medications    Prior to Admission medications   Medication Sig Start Date End Date Taking? Authorizing Provider  B Complex-C (SUPER B COMPLEX PO) Take 1 tablet by mouth daily.     [provider]  Cholecalciferol (VITAMIN D) 2000 units tablet Take 2,000 Units by mouth daily.     [provider]  diazepam (VALIUM) 2 MG tablet Take 1/2 to 1 tablet 2 to 3 x / day for acute anxiety &  please try to limit to 5 days /week to avoid addiction 06/14/18   Liane Comber, NP  fluticasone (FLONASE) 50 MCG/ACT nasal spray Place 2 sprays into both nostrils at bedtime. 11/09/18   Vicie Mutters, PA-C  latanoprost (XALATAN) 0.005 % ophthalmic solution Place 1 drop into both eyes at bedtime.  11/07/14   [provider]  Magnesium 250 MG TABS Take 250 mg by mouth daily.    [provider]  naproxen sodium (ALEVE) 220 MG  tablet Take 220 mg by mouth 2 (two) times daily as needed (for headaches or pain).    [provider]  nitroGLYCERIN (NITROSTAT) 0.4 MG SL tablet Place 1 tablet (0.4 mg total) under the tongue every 5 (five) minutes as needed for chest pain. 06/14/18 11/21/24  Liane Comber, NP  pantoprazole (PROTONIX) 40 MG tablet TAKE 1 TABLET(40 MG) BY MOUTH DAILY 12/12/18   Liane Comber, NP  PROAIR HFA [phone] Base) MCG/ACT inhaler INHALE 2 PUFFS INTO THE LUNGS EVERY 4 HOURS AS NEEDED FOR WHEEZING OR SHORTNESS OF BREATH 11/23/18   Vicie Mutters, PA-C    Family History Family History  Problem Relation Age of Onset  . Pneumonia Father   . Other Brother        Brain disease    Social History Social History   Tobacco Use  . Smoking status: Former Smoker    Quit date: 10/28/1984    Years since quitting: 34.7  . Smokeless tobacco: Never Used  Substance Use Topics  . Alcohol use: Yes     Alcohol/week: 0.0 standard drinks    Comment: wine 2-3 glasses once a month  . Drug use: No     Allergies   Meloxicam, Nitrofurantoin, Nitrofurantoin monohyd macro, and Sulfa antibiotics   Review of Systems Review of Systems  Gastrointestinal: Positive for abdominal pain.  All other systems reviewed and are negative.    Physical Exam Updated Vital Signs BP (!) 154/72   Pulse 73   Temp 98 F (36.7 C) (Oral)   Resp 11   SpO2 100%   Physical Exam Vitals signs and nursing note reviewed.  Constitutional:      General: She is not in acute distress.    Appearance: She is well-developed. She is not diaphoretic.  HENT:     Head: Normocephalic and atraumatic.  Neck:     Musculoskeletal: Normal range of motion and neck supple.  Cardiovascular:     Rate and Rhythm: Normal rate and regular rhythm.     Heart sounds: No murmur. No friction rub. No gallop.   Pulmonary:     Effort: Pulmonary effort is normal. No respiratory distress.     Breath sounds: Normal breath sounds. No wheezing.  Abdominal:     General: Bowel sounds are normal. There is no distension.     Palpations: Abdomen is soft.     Tenderness: There is abdominal tenderness in the epigastric area and left upper quadrant. There is no right CVA tenderness, left CVA tenderness, guarding or rebound.     Comments: There is tenderness to palpation in the epigastrium and left upper quadrant.  Tenderness is mild.  Musculoskeletal: Normal range of motion.  Skin:    General: Skin is warm and dry.  Neurological:     Mental Status: She is alert and oriented to person, place, and time.      ED Treatments / Results  Labs (all labs ordered are listed, but only abnormal results are displayed) Labs Reviewed  CBC  LIPASE, BLOOD  COMPREHENSIVE METABOLIC PANEL  URINALYSIS, ROUTINE W REFLEX MICROSCOPIC  TROPONIN I (HIGH SENSITIVITY)    EKG None  Radiology No results found.  Procedures Procedures (including critical  care time)  Medications Ordered in ED Medications  sodium chloride 0.9 % bolus 1,000 mL (has no administration in time range)     Initial Impression / Assessment and Plan / ED Course  I have reviewed the triage vital signs and the nursing notes.  Pertinent labs &  imaging results that were available during my care of the patient were reviewed by me and considered in my medical decision making (see chart for details).  Patient presenting here with epigastric/left upper quadrant pain.  It sounds as though this is been ongoing for quite a while, however worse over the past few days.  Patient is afebrile with normal laboratory studies and CT scan showing nothing acute.   I will have her follow-up with her gastroenterologist in the next few days and return if symptoms worsen.  Final Clinical Impressions(s) / ED Diagnoses   Final diagnoses:  None    ED Discharge Orders    None       Veryl Speak, MD 07/25/19 1517

## 2019-07-26 ENCOUNTER — Ambulatory Visit: Payer: Medicare Other | Admitting: Physician Assistant

## 2019-07-27 ENCOUNTER — Other Ambulatory Visit: Payer: Self-pay

## 2019-07-27 DIAGNOSIS — Z20822 Contact with and (suspected) exposure to covid-19: Secondary | ICD-10-CM

## 2019-07-30 ENCOUNTER — Telehealth: Payer: Self-pay

## 2019-07-30 LAB — NOVEL CORONAVIRUS, NAA: SARS-CoV-2, NAA: NOT DETECTED

## 2019-07-30 NOTE — Telephone Encounter (Signed)
Pt called for covid results advised that results are not back and will be notified via MyChart when results available.

## 2019-07-31 NOTE — Progress Notes (Signed)
Addendum 08/04/19 - UA positive for urinary tract infection.  Allergies to Sulfa and Macrobid,DX: Acute cystitis without hematuria -     amoxicillin-clavulanate (AUGMENTIN) 875-125 MG tablet; Take one tablet by mouth every 12 hours for seven days. Patient notified via telephone, medication and side effects discussed, all questions answered.  3 Month Follow Up   Assessment and Plan:   Keneshia was seen today for hospitalization follow-up.  Diagnoses and all orders for this visit:  Labile hypertension Elevated today, like related to pain Monitor at home Continue office if elevation continues. Continue to monitor -     CBC with Diff -     COMPLETE METABOLIC PANEL WITH GFR  Hyperlipidemia, unspecified hyperlipidemia type Continue medications: Discussed dietary and exercise modifications Low fat diet -     Lipid Profile  Abnormal glucose Discussed dietary and exercise modifications -     Hemoglobin A1c ([facility])  Vitamin D deficiency Continue supplementation Taking Vitamin D 2,000 IU daily -     Vitamin D (25 hydroxy)  LUQ pain Had CT at ED, unremarkable Constipation? Discussed MOM & prune juice for immediate relief, if not effective Dulcolax suppository THEN maintenance regiment: Metamucil, colace or miralax  Gastroesophageal reflux disease with esophagitis without hemorrhage See below Epigastric abdominal pain Increasing nighttime reflux D/C Protonix Rx - omeprazole (PRILOSEC) 40 MG capsule; Take 1 capsule (40 mg total) by mouth daily.  Osteoporosis, unspecified osteoporosis type, unspecified pathological fracture presence DEXA, UTD  Atherosclerosis of aorta (HCC) Control b/p, lipids, glucose, lifestyle modifications  Anxiety Doing well at this time Continue valium 2mg  1/2 to one tab  Hiatal hernia Discussed small meals No change on CT Acute bronchitis, unspecified organism -     fluticasone (FLONASE) 50 MCG/ACT nasal spray; Place 2 sprays into both  nostrils at bedtime.   Dysuria -     Urinalysis w microscopic + reflex cultur -     Urine Culture -     REFLEXIVE URINE CULTURE   History of renal cell cancer  Medication management Continued    Continue diet and meds as discussed. Further disposition pending results of labs. Discussed med's effects and SE's.  Patient agrees with plan of care and opportunity to ask questions/voice concerns. Over 30 minutes of chart review, interview, exam, counseling, and critical decision making was performed.   Future Appointments  Date Time Provider [place]  08/16/2019 11:00 AM Levin Erp, [place] [facility]  08/30/2019 10:30 AM Unk Pinto, [place] [facility] None  11/01/2019  9:00 AM GI-BCG MM 3 GI-BCGMM [facility]  11/01/2019  9:30 AM GI-[facility]G DX DEXA 1 GI-BCGDG [facility]  12/04/2019 11:15 AM Vicie Mutters, PA-C [facility] None  03/05/2020 10:00 AM Vicie Mutters, PA-C [facility] None    ----------------------------------------------------------------------------------------------------------------------  HPI 73 y.o. female  presents for 3 month follow up on labile HTN, HLD, GERD, abnormal glucose, history of pre-diabetes, osteopenia, weight and vitamin D deficiency.   She was recently had an ED evaluation for epigastric in LUQ pain that radiates to her back.  Symptoms originally began before Thanksgiving.  It has been intermittent and increasing in intensity over the past week.  Labs unremarkable and CT scan showed no acute abnormalities.  Instructed to follow up with GI , [facility] on December 15th but reports that it is a video visit and concerned about the effectiveness of this.  She would like referral to see GI, hopefully in person.  Today she reports it is a constant pain, currently is is 2/10.  The  pains comes and goes last night it was severe 8/10 for over 37min.  Location is in LUQ that radiates to her back. She is also  having epigastric discomfort with  increasing GERD symptoms.  She take protonix daily.  Reports she avoids triggers and has been unbale to link any particular food to her symptoms.    She reports constipation as well today, it has been a week since her last bowel movement.  She has tried miralax and it made her feel nauseated and stopped this.  The she tried fruits and increasing her water intake.  She does admit that her diet has not been the best and her activity has been decreased since covid and not walking as much since weather has become colder.  She was tested for CVOID19 on 07/27/19 which resulted negative.  BMI is Body mass index is 27.4 kg/m., she has been working on diet and exercise. Wt Readings from Last 3 Encounters:  08/01/19 145 lb (65.8 kg)  05/24/19 148 lb (67.1 kg)  02/22/19 148 lb 6.4 oz (67.3 kg)      Her blood pressure has been controlled at home, today their BP is BP: (!) 158/88  She does workout. She denies any cardiac symptoms, chest pains, palpitations, shortness of breath, dizziness or lower extremity edema.     She is not on cholesterol medication. Her cholesterol is not at goal. The cholesterol last visit was:   Lab Results  Component Value Date   CHOL 211 (H) 02/22/2019   HDL 79 02/22/2019   LDLCALC 113 (H) 02/22/2019   TRIG 89 02/22/2019   CHOLHDL 2.7 02/22/2019    She has been working on diet and exercise for prediabetes, and denies paresthesia of the feet, polydipsia, polyuria, visual disturbances, vomiting and weight loss. Last A1C in the office was:  Lab Results  Component Value Date   HGBA1C 5.8 (H) 11/09/2018   Patient is on Vitamin D supplement.   Lab Results  Component Value Date   VD25OH 45 02/22/2019       Current Medications:  Current Outpatient Medications on File Prior to Visit  Medication Sig  . B Complex-C (SUPER B COMPLEX PO) Take 1 tablet by mouth daily.   . Cholecalciferol (VITAMIN D) 2000 units tablet Take 2,000 Units by mouth daily.   . diazepam (VALIUM) 2  MG tablet Take 1/2 to 1 tablet 2 to 3 x / day for acute anxiety &  please try to limit to 5 days /week to avoid addiction  . latanoprost (XALATAN) 0.005 % ophthalmic solution Place 1 drop into both eyes at bedtime.   . Magnesium 250 MG TABS Take 250 mg by mouth daily.  . naproxen sodium (ALEVE) 220 MG tablet Take 220 mg by mouth 2 (two) times daily as needed (for headaches or pain).  . nitroGLYCERIN (NITROSTAT) 0.4 MG SL tablet Place 1 tablet (0.4 mg total) under the tongue every 5 (five) minutes as needed for chest pain.  . pantoprazole (PROTONIX) 40 MG tablet TAKE 1 TABLET(40 MG) BY MOUTH DAILY  . PROAIR HFA 108 (90 Base) MCG/ACT inhaler INHALE 2 PUFFS INTO THE LUNGS EVERY 4 HOURS AS NEEDED FOR WHEEZING OR SHORTNESS OF BREATH   No current facility-administered medications on file prior to visit.    Allergies:  Allergies  Allergen Reactions  . Meloxicam Itching  . Nitrofurantoin Itching and Other (See Comments)    "Flu-like" symptoms, also  . Nitrofurantoin Monohyd Macro Itching and Other (See Comments)    "  Flu-like" symptoms, also  . Sulfa Antibiotics Hives and Itching     Medical History:  Past Medical History:  Diagnosis Date  . Anxiety   . Chest discomfort   . GERD (gastroesophageal reflux disease)   . Hiatal hernia   . Hypertension    labile, normal cath 2013  . Osteoporosis   . Renal cell cancer ([place]) 1999   LEFT NEPHRECTOMY  . Vitamin D deficiency     Family history- Reviewed and unchanged   Social history- Reviewed and unchanged   Patient Care Team: Unk Pinto, MD as PCP - General (Internal Medicine) Warden Fillers, MD as Consulting Physician (Optometry) Josue Hector, MD as Consulting Physician (Cardiology) Alexis Frock, MD as Consulting Physician (Urology) Teena Irani, MD (Inactive) as Consulting Physician (Gastroenterology)   Screening Tests: Immunization History  Administered Date(s) Administered  . Influenza, High Dose Seasonal PF  06/22/2017, 06/14/2018, 06/19/2019  . Influenza, Seasonal, Injecte, Preservative Fre 07/28/2016  . Pneumococcal Conjugate-13 09/17/2015  . Pneumococcal Polysaccharide-23 10/04/2012  . Td 10/04/2009     Vaccinations: TD or Tdap: 09/2009  Influenza: 10/20 Pneumococcal: 09/2012 Prevnar13: 08/2015 Shingles: Zostavax/Shingrix: Discussed with patient   Preventative Care: Last colonoscopy: 01/2016 Last mammogram: 07/2018, Due for 2020 Last pap smear/pelvic exam: 04/2017   DEXA: 10/2014 T-2.2 AP Lumbar spine Hep C screening (1945-1965): 10/2014: Neg   Review of Systems:  Review of Systems  Constitutional: Negative for chills, diaphoresis, fever, malaise/fatigue and weight loss.  HENT: Negative for congestion, ear discharge, ear pain, hearing loss, nosebleeds, sinus pain, sore throat and tinnitus.   Eyes: Negative for blurred vision, double vision, photophobia, pain, discharge and redness.  Respiratory: Negative for cough, hemoptysis, sputum production, shortness of breath, wheezing and stridor.   Cardiovascular: Negative for chest pain, palpitations, orthopnea, claudication, leg swelling and PND.  Gastrointestinal: Positive for abdominal pain and constipation. Negative for blood in stool, diarrhea, heartburn, melena, nausea and vomiting.  Genitourinary: Positive for flank pain and frequency. Negative for dysuria, hematuria and urgency.  Musculoskeletal: Negative for back pain, falls, joint pain, myalgias and neck pain.  Skin: Negative for itching and rash.      Physical Exam: BP (!) 158/88   Pulse 78   Temp (!) 97.5 F (36.4 C)   Ht 5\' 1"  (1.549 m)   Wt 145 lb (65.8 kg)   SpO2 98%   BMI 27.40 kg/m  Wt Readings from Last 3 Encounters:  08/01/19 145 lb (65.8 kg)  05/24/19 148 lb (67.1 kg)  02/22/19 148 lb 6.4 oz (67.3 kg)   General Appearance: Well nourished, in no apparent distress. Eyes: PERRLA, EOMs, conjunctiva no swelling or erythema Sinuses: No Frontal/maxillary  tenderness ENT/Mouth: Ext aud canals clear, TMs without erythema, bulging. No erythema, swelling, or exudate on post pharynx.  Tonsils not swollen or erythematous. Hearing normal.  Neck: Supple, thyroid normal.  Respiratory: Respiratory effort normal, BS equal bilaterally without rales, rhonchi, wheezing or stridor.  Cardio: RRR with no MRGs. Brisk peripheral pulses without edema.  Abdomen: Soft, + BS.  Tender epigastric & LLQ, suprapubic, no guarding, rebound, hernias, masses. No CVA tenderness. Lymphatics: Non tender without lymphadenopathy.  Musculoskeletal: Full ROM, 5/5 strength, Normal gait Skin: Warm, dry without rashes, lesions, ecchymosis.  Neuro: Cranial nerves intact. No cerebellar symptoms.  Psych: Awake and oriented X 3, normal affect, Insight and Judgment appropriate.    [name], NP [facility] 11:30 AM

## 2019-08-01 ENCOUNTER — Ambulatory Visit: Payer: Medicare Other | Admitting: Adult Health Nurse Practitioner

## 2019-08-01 ENCOUNTER — Encounter: Payer: Self-pay | Admitting: Physician Assistant

## 2019-08-01 ENCOUNTER — Encounter: Payer: Self-pay | Admitting: Adult Health Nurse Practitioner

## 2019-08-01 ENCOUNTER — Other Ambulatory Visit: Payer: Self-pay

## 2019-08-01 VITALS — BP 158/88 | HR 78 | Temp 97.5°F | Ht 61.0 in | Wt 145.0 lb

## 2019-08-01 DIAGNOSIS — R1012 Left upper quadrant pain: Secondary | ICD-10-CM

## 2019-08-01 DIAGNOSIS — R7309 Other abnormal glucose: Secondary | ICD-10-CM | POA: Diagnosis not present

## 2019-08-01 DIAGNOSIS — Z85528 Personal history of other malignant neoplasm of kidney: Secondary | ICD-10-CM

## 2019-08-01 DIAGNOSIS — E559 Vitamin D deficiency, unspecified: Secondary | ICD-10-CM

## 2019-08-01 DIAGNOSIS — Z79899 Other long term (current) drug therapy: Secondary | ICD-10-CM

## 2019-08-01 DIAGNOSIS — N3000 Acute cystitis without hematuria: Secondary | ICD-10-CM

## 2019-08-01 DIAGNOSIS — I700 Atherosclerosis of aorta: Secondary | ICD-10-CM

## 2019-08-01 DIAGNOSIS — R0989 Other specified symptoms and signs involving the circulatory and respiratory systems: Secondary | ICD-10-CM | POA: Diagnosis not present

## 2019-08-01 DIAGNOSIS — K2100 Gastro-esophageal reflux disease with esophagitis, without bleeding: Secondary | ICD-10-CM

## 2019-08-01 DIAGNOSIS — F419 Anxiety disorder, unspecified: Secondary | ICD-10-CM

## 2019-08-01 DIAGNOSIS — E785 Hyperlipidemia, unspecified: Secondary | ICD-10-CM

## 2019-08-01 DIAGNOSIS — J209 Acute bronchitis, unspecified: Secondary | ICD-10-CM

## 2019-08-01 DIAGNOSIS — R1013 Epigastric pain: Secondary | ICD-10-CM

## 2019-08-01 DIAGNOSIS — K449 Diaphragmatic hernia without obstruction or gangrene: Secondary | ICD-10-CM

## 2019-08-01 DIAGNOSIS — M810 Age-related osteoporosis without current pathological fracture: Secondary | ICD-10-CM

## 2019-08-01 DIAGNOSIS — R300 Dysuria: Secondary | ICD-10-CM

## 2019-08-01 DIAGNOSIS — R8290 Unspecified abnormal findings in urine: Secondary | ICD-10-CM

## 2019-08-01 MED ORDER — FLUTICASONE PROPIONATE 50 MCG/ACT NA SUSP: 2.0000 | Freq: Every day | NASAL | 3 refills | Status: AC

## 2019-08-01 MED ORDER — OMEPRAZOLE 40 MG PO CPDR: 40.0000 mg | DELAYED_RELEASE_CAPSULE | Freq: Every day | ORAL | 1 refills | Status: DC

## 2019-08-01 NOTE — Patient Instructions (Addendum)
Referral for GI [facility] for follow up.  Warm 8oz of prune juice.  Then add one dose of Milk of Magnesium.    IF you do not have a bowel movement Use a ducolax supposity OR enema.   THEN you need to do maintenance for your bowels.  Take Ducosate sodium (Colace) once tablet daily.  IF it is TOO SOFT, then take every other day or every third day.  This is NOT a stimulant just a softener.  Miralax is similar.   Increase your water intake.   Fiber Content in Foods  See the following list for the dietary fiber content of some common foods. High-fiber foods High-fiber foods contain 4 grams or more (4g or more) of fiber per serving. They include:  Artichoke (fresh) - 1 medium has 10.3g of fiber.  Baked beans, plain or vegetarian (canned) -  cup has 5.2g of fiber.  Blackberries or raspberries (fresh) -  cup has 4g of fiber.  Bran cereal -  cup has 8.6g of fiber.  Bulgur (cooked) -  cup has 4g of fiber.  Kidney beans (canned) -  cup has 6.8g of fiber.  Lentils (cooked) -  cup has 7.8g of fiber.  Pear (fresh) - 1 medium has 5.1g of fiber.  Peas (frozen) -  cup has 4.4g of fiber.  Pinto beans (canned) -  cup has 5.5g of fiber.  Pinto beans (dried and cooked) -  cup has 7.7g of fiber.  Potato with skin (baked) - 1 medium has 4.4g of fiber.  Quinoa (cooked) -  cup has 5g of fiber.  Soybeans (canned, frozen, or fresh) -  cup has 5.1g of fiber. Moderate-fiber foods Moderate-fiber foods contain 1-4 grams (1-4g) of fiber per serving. They include:  Almonds - 1 oz. has 3.5g of fiber.  Apple with skin - 1 medium has 3.3g of fiber.  Applesauce, sweetened -  cup has 1.5g of fiber.  Bagel, plain - one 4-inch (10-cm) bagel has 2g of fiber.  Banana - 1 medium has 3.1g of fiber.  Broccoli (cooked) -  cup has 2.5g of fiber.  Carrots (cooked) -  cup has 2.3g of fiber.  Corn (canned or frozen) -  cup has 2.1g of fiber.  Corn tortilla - one 6-inch (15-cm) tortilla  has 1.5g of fiber.  Green beans (canned) -  cup has 2g of fiber.  Instant oatmeal -  cup has about 2g of fiber.  Long-grain brown rice (cooked) - 1 cup has 3.5g of fiber.  Macaroni, enriched (cooked) - 1 cup has 2.5g of fiber.  Melon - 1 cup has 1.4g of fiber.  Multigrain cereal -  cup has about 2-4g of fiber.  Orange - 1 small has 3.1g of fiber.  Potatoes, mashed -  cup has 1.6g of fiber.  Raisins - 1/4 cup has 1.6g of fiber.  Squash -  cup has 2.9g of fiber.  Sunflower seeds -  cup has 1.1g of fiber.  Tomato - 1 medium has 1.5g of fiber.  Vegetable or soy patty - 1 has 3.4g of fiber.  Whole-wheat bread - 1 slice has 2g of fiber.  Whole-wheat spaghetti -  cup has 3.2g of fiber. Low-fiber foods Low-fiber foods contain less than 1 gram (less than 1g) of fiber per serving. They include:  Egg - 1 large.  Flour tortilla - one 6-inch (15-cm) tortilla.  Fruit juice -  cup.  Lettuce - 1 cup.  Meat, poultry, or fish - 1 oz.  Milk -  1 cup.  Spinach (raw) - 1 cup.  White bread - 1 slice.  White rice -  cup.  Yogurt -  cup. Actual amounts of fiber in foods may be different depending on processing. Talk with your dietitian about how much fiber you need in your diet. This information is not intended to replace advice given to you by your health care provider. Make sure you discuss any questions you have with your health care provider. Document Released: 12/27/2006 Document Revised: 04/02/2016 Document Reviewed: 10/03/2015 Elsevier Patient Education  2020 [employer].

## 2019-08-02 LAB — COMPLETE METABOLIC PANEL WITH GFR
AG Ratio: 1.6 (calc) (ref 1.0–2.5)
ALT: 7 U/L (ref 6–29)
AST: 20 U/L (ref 10–35)
Albumin: 4.6 g/dL (ref 3.6–5.1)
Alkaline phosphatase (APISO): 61 U/L (ref 37–153)
BUN: 11 mg/dL (ref 7–25)
CO2: 27 mmol/L (ref 20–32)
Calcium: 10.1 mg/dL (ref 8.6–10.4)
Chloride: 102 mmol/L (ref 98–110)
Creat: 0.82 mg/dL (ref 0.60–0.93)
GFR, Est African American: 82 mL/min/{1.73_m2} (ref >=60)
GFR, Est Non African American: 70 mL/min/{1.73_m2} (ref >=60)
Globulin: 2.9 g/dL (calc) (ref 1.9–3.7)
Glucose, Bld: 88 mg/dL (ref 65–99)
Potassium: 4.5 mmol/L (ref 3.5–5.3)
Sodium: 140 mmol/L (ref 135–146)
Total Bilirubin: 0.6 mg/dL (ref 0.2–1.2)
Total Protein: 7.5 g/dL (ref 6.1–8.1)

## 2019-08-02 LAB — CBC WITH DIFFERENTIAL/PLATELET
Absolute Monocytes: 387 cells/uL (ref 200–950)
Basophils Absolute: 42 cells/uL (ref 0–200)
Basophils Relative: 0.8 %
Eosinophils Absolute: 58 cells/uL (ref 15–500)
Eosinophils Relative: 1.1 %
HCT: 41.5 % (ref 35.0–45.0)
Hemoglobin: 13.5 g/dL (ref 11.7–15.5)
Lymphs Abs: 1760 cells/uL (ref 850–3900)
MCH: 26.7 pg — ABNORMAL LOW (ref 27.0–33.0)
MCHC: 32.5 g/dL (ref 32.0–36.0)
MCV: 82 fL (ref 80.0–100.0)
MPV: 10.3 fL (ref 7.5–12.5)
Monocytes Relative: 7.3 %
Neutro Abs: 3053 cells/uL (ref 1500–7800)
Neutrophils Relative %: 57.6 %
Platelets: 261 10*3/uL (ref 140–400)
RBC: 5.06 10*6/uL (ref 3.80–5.10)
RDW: 13.3 % (ref 11.0–15.0)
Total Lymphocyte: 33.2 %
WBC: 5.3 10*3/uL (ref 3.8–10.8)

## 2019-08-02 LAB — HEMOGLOBIN A1C
Hgb A1c MFr Bld: 5.6 % of total Hgb (ref <5.7)
Mean Plasma Glucose: 114 (calc)
eAG (mmol/L): 6.3 (calc)

## 2019-08-02 LAB — LIPID PANEL
Cholesterol: 215 mg/dL — ABNORMAL HIGH (ref <200)
HDL: 78 mg/dL (ref >=50)
LDL Cholesterol (Calc): 117 mg/dL (calc) — ABNORMAL HIGH
Non-HDL Cholesterol (Calc): 137 mg/dL (calc) — ABNORMAL HIGH (ref <130)
Total CHOL/HDL Ratio: 2.8 (calc) (ref <5.0)
Triglycerides: 96 mg/dL (ref <150)

## 2019-08-02 LAB — VITAMIN D 25 HYDROXY (VIT D DEFICIENCY, FRACTURES): Vit D, 25-Hydroxy: 34 ng/mL (ref 30–100)

## 2019-08-03 ENCOUNTER — Ambulatory Visit: Payer: Medicare Other

## 2019-08-03 ENCOUNTER — Other Ambulatory Visit: Payer: Medicare Other

## 2019-08-04 LAB — URINALYSIS W MICROSCOPIC + REFLEX CULTURE
Bilirubin Urine: NEGATIVE
Glucose, UA: NEGATIVE
Hgb urine dipstick: NEGATIVE
Hyaline Cast: NONE SEEN /LPF
Ketones, ur: NEGATIVE
Nitrites, Initial: NEGATIVE
Protein, ur: NEGATIVE
RBC / HPF: NONE SEEN /HPF (ref 0–2)
Specific Gravity, Urine: 1.007 (ref 1.001–1.03)
Squamous Epithelial / HPF: NONE SEEN /HPF (ref <=5)
pH: 7.5 (ref 5.0–8.0)

## 2019-08-04 LAB — URINE CULTURE
MICRO NUMBER:: 1177574
SPECIMEN QUALITY:: ADEQUATE

## 2019-08-04 LAB — CULTURE INDICATED

## 2019-08-04 MED ORDER — AMOXICILLIN-POT CLAVULANATE 875-125 MG PO TABS: ORAL_TABLET | ORAL | 0 refills | Status: DC

## 2019-08-14 ENCOUNTER — Other Ambulatory Visit: Payer: Self-pay | Admitting: Internal Medicine

## 2019-08-14 MED ORDER — CIPROFLOXACIN HCL 500 MG PO TABS: ORAL_TABLET | ORAL | 0 refills | Status: DC

## 2019-08-16 ENCOUNTER — Ambulatory Visit: Payer: Medicare Other | Admitting: Physician Assistant

## 2019-08-16 ENCOUNTER — Encounter: Payer: Self-pay | Admitting: Physician Assistant

## 2019-08-16 VITALS — BP 162/88 | HR 69 | Temp 98.1°F | Ht 61.0 in | Wt 145.0 lb

## 2019-08-16 DIAGNOSIS — K5900 Constipation, unspecified: Secondary | ICD-10-CM | POA: Diagnosis not present

## 2019-08-16 DIAGNOSIS — R1012 Left upper quadrant pain: Secondary | ICD-10-CM

## 2019-08-16 DIAGNOSIS — R1013 Epigastric pain: Secondary | ICD-10-CM

## 2019-08-16 DIAGNOSIS — R110 Nausea: Secondary | ICD-10-CM

## 2019-08-16 DIAGNOSIS — Z1211 Encounter for screening for malignant neoplasm of colon: Secondary | ICD-10-CM

## 2019-08-16 MED ORDER — OMEPRAZOLE 40 MG PO CPDR: 40.0000 mg | DELAYED_RELEASE_CAPSULE | Freq: Two times a day (BID) | ORAL | 3 refills | Status: AC

## 2019-08-16 NOTE — Progress Notes (Signed)
Reviewed and agree with management plan.  Keiara Sneeringer T. Jahna Liebert, MD FACG [facility]  

## 2019-08-16 NOTE — Patient Instructions (Signed)
We will obtain your records from [facility]  We will contact you if you need to schedule a Colonoscopy/Endoscopy once we receive your records  Increase omeprazole to 40 mg twice daily, a new prescription has been sent to your pharmacy  If you are age 74 or older, your body mass index should be between 23-30. Your Body mass index is 27.4 kg/m. If this is out of the aforementioned range listed, please consider follow up with your Primary Care Provider.  If you are age 66 or younger, your body mass index should be between 19-25. Your Body mass index is 27.4 kg/m. If this is out of the aformentioned range listed, please consider follow up with your Primary Care Provider.    I appreciate the  opportunity to care for you  Thank You   Kimberly Wilcox

## 2019-08-16 NOTE — Progress Notes (Addendum)
Chief Complaint: Follow-up ER visit for epigastric pain  HPI:    Kimberly Wilcox is a 73 year old Caucasian female with a past medical history as listed below including renal cell cancer and reflux, who presents clinic today for follow-up after being seen in the ER on 07/25/2019 with epigastric pain.    07/25/2019 presented to the ER for epigastric and left upper quadrant discomfort radiating to her back.  Labs including CBC, CMP, lipase, urinalysis and troponin were normal.  CT abdomen pelvis with contrast showed changes of prior granulomatous disease, mild distention of the gallbladder without complicating factors, status post left nephrectomy and nonobstructing right renal stone.    08/01/2019 patient saw PCP and discuss left upper quadrant pain.  It was discussed that she should use milk of magnesia and prune juice for immediate relief and if not effective Dulcolax suppository and then maintenance regimen of Metamucil, Colace or MiraLAX.  Reflux is also discussed with increase in 19 time symptoms.  Patient's Protonix was changed to Omeprazole 40 mg daily.    Today, the patient presents to clinic and explains that she used to follow with [facility] and believes that she is due for a colonoscopy this year.      Most concerning to her is that over the past 6 months she has developed an epigastric discomfort which radiates around to her left upper quadrant and through to her back.  It is a constant pain described as a soreness which is currently rated as a 2/10 but can increase to a 12/10.  Explains that for 2 days this was absolutely terrible which is when she went to the ER.  Along with this has noticed an increase in gas and burping with some nausea.  Denies any heartburn or reflux symptoms.  Believes that the pain has possibly improved slightly since being switched from Protonix to Omeprazole 40 mg daily over the past couple of weeks.  Pain does not increase/worsen with any activity including eating or moving  around.    Also tells me that she was constipated but did took a cocktail of milk of magnesia and prune juice per her PCP and since having 1 good cleanout she has not had any further problem with that.  Currently actually having more liquid stools but is also on Ciprofloxacin for a UTI.    Denies fever, chills, blood in her stool, weight loss, anorexia, vomiting or symptoms that awaken her from sleep.  Past Medical History:  Diagnosis Date  . Anxiety   . Chest discomfort   . GERD (gastroesophageal reflux disease)   . Hiatal hernia   . Hypertension    labile, normal cath 2013  . Osteoporosis   . Renal cell cancer ([place]) 1999   LEFT NEPHRECTOMY  . Vitamin D deficiency     Past Surgical History:  Procedure Laterality Date  . LEFT OOPHORECTOMY  1985   Benign tumor  . NEPHRECTOMY Left 1999   secondary to cancer  . TUBAL LIGATION      Current Outpatient Medications  Medication Sig Dispense Refill  . amoxicillin-clavulanate (AUGMENTIN) 875-125 MG tablet Take one tablet by mouth every 12 hours for seven days. 14 tablet 0  . B Complex-C (SUPER B COMPLEX PO) Take 1 tablet by mouth daily.     . Cholecalciferol (VITAMIN D) 2000 units tablet Take 2,000 Units by mouth daily.     . ciprofloxacin (CIPRO) 500 MG tablet Take 1 tablet 2 x /day with Meals  for UTI 28  tablet 0  . diazepam (VALIUM) 2 MG tablet Take 1/2 to 1 tablet 2 to 3 x / day for acute anxiety &  please try to limit to 5 days /week to avoid addiction 90 tablet 0  . fluticasone (FLONASE) 50 MCG/ACT nasal spray Place 2 sprays into both nostrils at bedtime. 16 g 3  . latanoprost (XALATAN) 0.005 % ophthalmic solution Place 1 drop into both eyes at bedtime.     . Magnesium 250 MG TABS Take 250 mg by mouth daily.    . naproxen sodium (ALEVE) 220 MG tablet Take 220 mg by mouth 2 (two) times daily as needed (for headaches or pain).    . nitroGLYCERIN (NITROSTAT) 0.4 MG SL tablet Place 1 tablet (0.4 mg total) under the tongue every 5 (five)  minutes as needed for chest pain. 25 tablet 4  . omeprazole (PRILOSEC) 40 MG capsule Take 1 capsule (40 mg total) by mouth daily. 30 capsule 1  . pantoprazole (PROTONIX) 40 MG tablet TAKE 1 TABLET(40 MG) BY MOUTH DAILY 90 tablet 1  . PROAIR HFA 108 (90 Base) MCG/ACT inhaler INHALE 2 PUFFS INTO THE LUNGS EVERY 4 HOURS AS NEEDED FOR WHEEZING OR SHORTNESS OF BREATH 8.5 g 3   No current facility-administered medications for this visit.    Allergies as of 08/16/2019 - Review Complete 08/01/2019  Allergen Reaction Noted  . Meloxicam Itching 10/01/2015  . Nitrofurantoin Itching and Other (See Comments) 10/01/2015  . Nitrofurantoin monohyd macro Itching and Other (See Comments) 04/22/2011  . Sulfa antibiotics Hives and Itching 04/22/2011    Family History  Problem Relation Age of Onset  . Pneumonia Father   . Other Brother        Brain disease    Social History   Socioeconomic History  . Marital status: Widowed    Spouse name: Not on file  . Number of children: 4  . Years of education: Not on file  . Highest education level: Not on file  Occupational History  . Occupation: Insurance claims handler  Tobacco Use  . Smoking status: Former Smoker    Quit date: 10/28/1984    Years since quitting: 34.8  . Smokeless tobacco: Never Used  Substance and Sexual Activity  . Alcohol use: Yes    Alcohol/week: 0.0 standard drinks    Comment: wine 2-3 glasses once a month  . Drug use: No  . Sexual activity: Not Currently    Comment: 1st intercourse 13 yo-5 partners  Other Topics Concern  . Not on file  Social History Narrative  . Not on file   Social Determinants of Health   Financial Resource Strain:   . Difficulty of Paying Living Expenses: Not on file  Food Insecurity:   . Worried About Charity fundraiser in the Last Year: Not on file  . Ran Out of Food in the Last Year: Not on file  Transportation Needs:   . Lack of Transportation (Medical): Not on file  . Lack of Transportation  (Non-Medical): Not on file  Physical Activity:   . Days of Exercise per Week: Not on file  . Minutes of Exercise per Session: Not on file  Stress:   . Feeling of Stress : Not on file  Social Connections:   . Frequency of Communication with Friends and Family: Not on file  . Frequency of Social Gatherings with Friends and Family: Not on file  . Attends Religious Services: Not on file  . Active Member of Clubs or  Organizations: Not on file  . Attends Archivist Meetings: Not on file  . Marital Status: Not on file  Intimate Partner Violence:   . Fear of Current or Ex-Partner: Not on file  . Emotionally Abused: Not on file  . Physically Abused: Not on file  . Sexually Abused: Not on file    Review of Systems:    Constitutional: No weight loss, fever or chills Skin: No rash  Cardiovascular: No chest pain   Respiratory: No SOB Gastrointestinal: See HPI and otherwise negative Genitourinary: No dysuria Neurological: No headache, dizziness or syncope Musculoskeletal: No new muscle or joint pain Hematologic: No bleeding  Psychiatric: No history of depression or anxiety   Physical Exam:  Vital signs: BP (!) 162/88   Pulse 69   Temp 98.1 F (36.7 C)   Ht 5\' 1"  (1.549 m)   Wt 145 lb (65.8 kg)   BMI 27.40 kg/m   Constitutional:   Pleasant Caucasian female appears to be in NAD, Well developed, Well nourished, alert and cooperative Head:  Normocephalic and atraumatic. Eyes:   PEERL, EOMI. No icterus. Conjunctiva pink. Ears:  Normal auditory acuity. Neck:  Supple Throat: Oral cavity and pharynx without inflammation, swelling or lesion.  Respiratory: Respirations even and unlabored. Lungs clear to auscultation bilaterally.   No wheezes, crackles, or rhonchi.  Cardiovascular: Normal S1, S2. No MRG. Regular rate and rhythm. No peripheral edema, cyanosis or pallor.  Gastrointestinal:  Soft, nondistended, Mild LUQ ttp, No rebound or guarding. Normal bowel sounds. No appreciable  masses or hepatomegaly. Rectal:  Not performed.  Msk:  Symmetrical without gross deformities. Without edema, no deformity or joint abnormality.  Neurologic:  Alert and  oriented x4;  grossly normal neurologically.  Skin:   Dry and intact without significant lesions or rashes. Psychiatric: Demonstrates good judgement and reason without abnormal affect or behaviors.  MOST RECENT LABS AND IMAGING: CBC    Component Value Date/Time   WBC 5.3 08/01/2019 1049   RBC 5.06 08/01/2019 1049   HGB 13.5 08/01/2019 1049   HCT 41.5 08/01/2019 1049   PLT 261 08/01/2019 1049   MCV 82.0 08/01/2019 1049   MCH 26.7 (L) 08/01/2019 1049   MCHC 32.5 08/01/2019 1049   RDW 13.3 08/01/2019 1049   LYMPHSABS 1,760 08/01/2019 1049   MONOABS 280 03/17/2017 1043   EOSABS 58 08/01/2019 1049   BASOSABS 42 08/01/2019 1049    CMP     Component Value Date/Time   NA 140 08/01/2019 1049   K 4.5 08/01/2019 1049   CL 102 08/01/2019 1049   CO2 27 08/01/2019 1049   GLUCOSE 88 08/01/2019 1049   BUN 11 08/01/2019 1049   CREATININE 0.82 08/01/2019 1049   CALCIUM 10.1 08/01/2019 1049   PROT 7.5 08/01/2019 1049   ALBUMIN 4.5 07/25/2019 1148   AST 20 08/01/2019 1049   ALT 7 08/01/2019 1049   ALKPHOS 67 07/25/2019 1148   BILITOT 0.6 08/01/2019 1049   GFRNONAA 70 08/01/2019 1049   GFRAA 82 08/01/2019 1049    Assessment: 1.  Epigastric/left upper quadrant pain: Over the past 6 months, worsened over the past month ever since Thanksgiving, some better with change from Protonix to Omeprazole; consider gastritis this versus splenic flexure syndrome versus other 2.  Screening for colon cancer: Patient tells me she is due for a colonoscopy 3.  Nausea 4.  Constipation: Better after cleanout with milk of magnesia and prune juice  Plan: 1.  Discussed with patient that likely she  is having an increase in looser stools due to her current antibiotic.  Recommend that she monitor the situation after she finishes  antibiotics. 2.  Would recommend the patient have an EGD for further evaluation of her epigastric pain.  She tells me that she is due for a colonoscopy as well.  We will request records from [place].  Once we get these records we can better assess if she is due for a colonoscopy.  If so she will be scheduled for an EGD and Colonoscopy in the [place] with Dr. Fuller Plan as he is supervising this morning.  She will need to come in for nurse previsit. 3.  Increased patient's Omeprazole to 40 mg twice daily, 30-60 minutes before breakfast and dinner #60 with 3 refills. 4.  Patient to follow in clinic per recommendations after receiving records from [place].  Ellouise Newer, PA-C [facility] 08/16/2019, 10:44 AM  Cc: Unk Pinto, MD   Addendum: 08/30/2019 1356 Received requested records from [facility]  02/11/2018 office visit for follow-up after starting Apriso.  He was noted the patient had tried 5 ASA for 7 days and only took 1 and gave her increased upper abdominal pains went away when she stopped.  At that time was having some right upper quadrant abdominal pain and was given a trial of prednisone 20 a day.  Was discussed again consider CCK HIDA test versus repeat colonoscopy or surgical consult for laparoscope and possible cholecystectomy.  01/06/2018 [facility] labs showing IBD specific P ANCA diagnosis of ulcerative colitis  We did not receive any records from past colonoscopy or EGD.  No change to plans  Ellouise Newer, PA-C

## 2019-08-22 ENCOUNTER — Other Ambulatory Visit: Payer: Self-pay | Admitting: Adult Health Nurse Practitioner

## 2019-08-22 DIAGNOSIS — B3731 Acute candidiasis of vulva and vagina: Secondary | ICD-10-CM

## 2019-08-22 DIAGNOSIS — B373 Candidiasis of vulva and vagina: Secondary | ICD-10-CM

## 2019-08-22 MED ORDER — FLUCONAZOLE 150 MG PO TABS: ORAL_TABLET | ORAL | 0 refills | Status: DC

## 2019-08-30 ENCOUNTER — Encounter: Payer: Self-pay | Admitting: Internal Medicine

## 2019-08-30 ENCOUNTER — Other Ambulatory Visit: Payer: Self-pay

## 2019-08-30 ENCOUNTER — Ambulatory Visit (INDEPENDENT_AMBULATORY_CARE_PROVIDER_SITE_OTHER): Payer: Medicare PPO | Admitting: Internal Medicine

## 2019-08-30 VITALS — BP 152/82 | HR 64 | Temp 97.6°F | Resp 16 | Ht 61.0 in | Wt 145.6 lb

## 2019-08-30 DIAGNOSIS — R1012 Left upper quadrant pain: Secondary | ICD-10-CM | POA: Diagnosis not present

## 2019-08-30 DIAGNOSIS — E559 Vitamin D deficiency, unspecified: Secondary | ICD-10-CM

## 2019-08-30 DIAGNOSIS — Z79899 Other long term (current) drug therapy: Secondary | ICD-10-CM

## 2019-08-30 DIAGNOSIS — N3000 Acute cystitis without hematuria: Secondary | ICD-10-CM

## 2019-08-30 DIAGNOSIS — K2100 Gastro-esophageal reflux disease with esophagitis, without bleeding: Secondary | ICD-10-CM | POA: Diagnosis not present

## 2019-08-30 DIAGNOSIS — E782 Mixed hyperlipidemia: Secondary | ICD-10-CM

## 2019-08-30 DIAGNOSIS — R7309 Other abnormal glucose: Secondary | ICD-10-CM

## 2019-08-30 DIAGNOSIS — R0989 Other specified symptoms and signs involving the circulatory and respiratory systems: Secondary | ICD-10-CM

## 2019-08-30 MED ORDER — DICYCLOMINE HCL 20 MG PO TABS: ORAL_TABLET | ORAL | 0 refills | Status: DC

## 2019-08-30 MED ORDER — LOSARTAN POTASSIUM-HCTZ 100-25 MG PO TABS: ORAL_TABLET | ORAL | 1 refills | Status: DC

## 2019-08-30 MED ORDER — DIAZEPAM 2 MG PO TABS: ORAL_TABLET | ORAL | 0 refills | Status: DC

## 2019-08-30 NOTE — Patient Instructions (Signed)

## 2019-08-30 NOTE — Progress Notes (Signed)
History of Present Illness:      This very nice 74 y.o. WWF presents for 6 month follow up with HTN, HLD, Pre-Diabetes and Vitamin D Deficiency.  Patient has GERD controlled on her meds. In 1999, she had a Left Nephrectomy for RCC. Patient was recently trearted for UTI.      Patient is followed for labile HTN & BP has been controlled at home. Today's BP is elevated at 152/82. Patient had a Normal Heart Cath in 2013.  She has had no complaints of any cardiac type chest pain, palpitations, dyspnea / orthopnea / PND, dizziness, claudication, or dependent edema. BP Readings from Last 3 Encounters:  08/30/19 (!) 152/82  08/16/19 (!) 162/88  08/01/19 (!) 158/88        Hyperlipidemia is controlled with diet & meds. Patient denies myalgias or other med SE's. Last Lipids were not at goal:  Lab Results  Component Value Date   CHOL 215 (H) 08/01/2019   HDL 78 08/01/2019   LDLCALC 117 (H) 08/01/2019   TRIG 96 08/01/2019   CHOLHDL 2.8 08/01/2019        Also, the patient is monitored expectantly for glucose intolerance and has had no symptoms of reactive hypoglycemia, diabetic polys, paresthesias or visual blurring.  Last A1c was Normal & at goal:  Lab Results  Component Value Date   HGBA1C 5.6 08/01/2019        Further, the patient also has history of Vitamin D Deficiency and does not supplement vitamin D as recommended. Last vitamin D was still very low:  Lab Results  Component Value Date   VD25OH 34 08/01/2019    Current Outpatient Medications on File Prior to Visit  Medication Sig  . acetaminophen (TYLENOL) 325 MG tablet Take 650 mg by mouth every 6 (six) hours as needed.  . B Complex-C (SUPER B COMPLEX PO) Take 1 tablet by mouth daily.   . Cholecalciferol (VITAMIN D) 2000 units tablet Take 2,000 Units by mouth daily.   . diazepam (VALIUM) 2 MG tablet Take 1/2 to 1 tablet 2 to 3 x / day for acute anxiety &  please try to limit to 5 days /week to avoid addiction  . fluconazole  (DIFLUCAN) 150 MG tablet Take one tablet at onset of symptoms and second tablet three days later for yeast.  . fluticasone (FLONASE) 50 MCG/ACT nasal spray Place 2 sprays into both nostrils at bedtime.  Marland Kitchen latanoprost (XALATAN) 0.005 % ophthalmic solution Place 1 drop into both eyes at bedtime.   . Magnesium 250 MG TABS Take 250 mg by mouth daily.  . naproxen sodium (ALEVE) 220 MG tablet Take 220 mg by mouth 2 (two) times daily as needed (for headaches or pain).  . nitroGLYCERIN (NITROSTAT) 0.4 MG SL tablet Place 1 tablet (0.4 mg total) under the tongue every 5 (five) minutes as needed for chest pain.  Marland Kitchen omeprazole (PRILOSEC) 40 MG capsule Take 1 capsule (40 mg total) by mouth 2 (two) times daily.  Marland Kitchen PROAIR HFA 108 (90 Base) MCG/ACT inhaler INHALE 2 PUFFS INTO THE LUNGS EVERY 4 HOURS AS NEEDED FOR WHEEZING OR SHORTNESS OF BREATH   No current facility-administered medications on file prior to visit.    Allergies  Allergen Reactions  . Meloxicam Itching  . Nitrofurantoin Itching and Other (See Comments)    "Flu-like" symptoms, also  . Nitrofurantoin Monohyd Macro Itching and Other (See Comments)    "Flu-like" symptoms, also  . Sulfa Antibiotics Hives  and Itching    PMHx:   Past Medical History:  Diagnosis Date  . Anxiety   . Chest discomfort   . GERD (gastroesophageal reflux disease)   . Hiatal hernia   . Hypertension    labile, normal cath 2013  . Osteoporosis   . Renal cell cancer ([place]) 1999   LEFT NEPHRECTOMY  . Vitamin D deficiency    Immunization History  Administered Date(s) Administered  . Influenza, High Dose Seasonal PF 06/22/2017, 06/14/2018, 06/19/2019  . Influenza, Seasonal, Injecte, Preservative Fre 07/28/2016  . Pneumococcal Conjugate-13 09/17/2015  . Pneumococcal Polysaccharide-23 10/04/2012  . Td 10/04/2009   Past Surgical History:  Procedure Laterality Date  . LEFT OOPHORECTOMY  1985   Benign tumor  . NEPHRECTOMY Left 1999   secondary to cancer  . TUBAL  LIGATION      FHx:    Reviewed / unchanged  SHx:    Reviewed / unchanged   Systems Review:  Constitutional: Denies fever, chills, wt changes, headaches, insomnia, fatigue, night sweats, change in appetite. Eyes: Denies redness, blurred vision, diplopia, discharge, itchy, watery eyes.  ENT: Denies discharge, congestion, post nasal drip, epistaxis, sore throat, earache, hearing loss, dental pain, tinnitus, vertigo, sinus pain, snoring.  CV: Denies chest pain, palpitations, irregular heartbeat, syncope, dyspnea, diaphoresis, orthopnea, PND, claudication or edema. Respiratory: denies cough, dyspnea, DOE, pleurisy, hoarseness, laryngitis, wheezing.  Gastrointestinal: Denies dysphagia, odynophagia, heartburn, reflux, water brash, abdominal pain or cramps, nausea, vomiting, bloating, diarrhea, constipation, hematemesis, melena, hematochezia  or hemorrhoids. Genitourinary: Denies dysuria, frequency, urgency, nocturia, hesitancy, discharge, hematuria or flank pain. Musculoskeletal: Denies arthralgias, myalgias, stiffness, jt. swelling, pain, limping or strain/sprain.  Skin: Denies pruritus, rash, hives, warts, acne, eczema or change in skin lesion(s). Neuro: No weakness, tremor, incoordination, spasms, paresthesia or pain. Psychiatric: Denies confusion, memory loss or sensory loss. Endo: Denies change in weight, skin or hair change.  Heme/Lymph: No excessive bleeding, bruising or enlarged lymph nodes.  Physical Exam  BP (!) 152/82   Pulse 64   Temp 97.6 F (36.4 C)   Resp 16   Ht 5\' 1"  (1.549 m)   Wt 145 lb 9.6 oz (66 kg)   BMI 27.51 kg/m   Appears  well nourished, well groomed  and in no distress.  Eyes: PERRLA, EOMs, conjunctiva no swelling or erythema. Sinuses: No frontal/maxillary tenderness ENT/Mouth: EAC's clear, TM's nl w/o erythema, bulging. Nares clear w/o erythema, swelling, exudates. Oropharynx clear without erythema or exudates. Oral hygiene is good. Tongue normal, non  obstructing. Hearing intact.  Neck: Supple. Thyroid not palpable. Car 2+/2+ without bruits, nodes or JVD. Chest: Respirations nl with BS clear & equal w/o rales, rhonchi, wheezing or stridor.  Cor: Heart sounds normal w/ regular rate and rhythm without sig. murmurs, gallops, clicks or rubs. Peripheral pulses normal and equal  without edema.  Abdomen: Soft & bowel sounds normal. Non-tender w/o guarding, rebound, hernias, masses or organomegaly.  Lymphatics: Unremarkable.  Musculoskeletal: Full ROM all peripheral extremities, joint stability, 5/5 strength and normal gait.  Skin: Warm, dry without exposed rashes, lesions or ecchymosis apparent.  Neuro: Cranial nerves intact, reflexes equal bilaterally. Sensory-motor testing grossly intact. Tendon reflexes grossly intact.  Pysch: Alert & oriented x 3.  Insight and judgement nl & appropriate. No ideations.  Assessment and Plan:  1. Labile hypertension  - Continue medication, monitor blood pressure at home.  - Continue DASH diet.  Reminder to go to the ER if any CP,  SOB, nausea, dizziness, severe HA, changes vision/speech.  -  losartan-hydrochlorothiazide (HYZAAR) 100-25 MG tablet; Take 1 tablet Daily for BP  Dispense: 90 tablet; Refill: 1  2. Left upper quadrant abdominal pain  - dicyclomine (BENTYL) 20 MG tablet; Take 1 tablet 3 x /day before meals for Abdominal Pain  Dispense: 90 tablet; Refill: 0  3. Hyperlipidemia, mixed  - Continue diet/meds, exercise,& lifestyle modifications.  - Continue monitor periodic cholesterol/liver & renal functions   4. Abnormal glucose  - Continue diet, exercise  - Lifestyle modifications.   5. Vitamin D deficiency  - Continue supplementation.   6. Gastroesophageal reflux disease with esophagitis without hemorrhage  7. Acute cystitis without hematuria  - Urinalysis, Routine w reflex microscopic - Culture, Urine  8. Medication management         Discussed  regular exercise, BP monitoring,  weight control to achieve/maintain BMI less than 25 and discussed med and SE's. Recommended labs to assess and monitor clinical status with further disposition pending results of labs.  I discussed the assessment and treatment plan with the patient. The patient was provided an opportunity to ask questions and all were answered. The patient agreed with the plan and demonstrated an understanding of the instructions.  I provided over 30 minutes of exam, counseling, chart review and  complex critical decision making.  Kirtland Bouchard, MD

## 2019-08-31 LAB — URINALYSIS, ROUTINE W REFLEX MICROSCOPIC
Bilirubin Urine: NEGATIVE
Glucose, UA: NEGATIVE
Hgb urine dipstick: NEGATIVE
Leukocytes,Ua: NEGATIVE
Nitrite: NEGATIVE
Protein, ur: NEGATIVE
Specific Gravity, Urine: 1.015 (ref 1.001–1.03)
pH: 6 (ref 5.0–8.0)

## 2019-08-31 LAB — URINE CULTURE
MICRO NUMBER:: 10013924
Result:: NO GROWTH
SPECIMEN QUALITY:: ADEQUATE

## 2019-09-07 ENCOUNTER — Encounter: Payer: Self-pay | Admitting: *Deleted

## 2019-09-14 NOTE — Progress Notes (Signed)
Subjective:    Patient ID: Kimberly Wilcox, female    DOB: November 14, 1945, 74 y.o.   MRN: [id number]  HPI    This very nice 74 yo WWF returns for 2 week f/u of elevated BP of 152/82 and had been started on Hyzaar 100/25. She reports random BP's have been labile ranging form 119/60 up to 150/97.      She was also started on Bentyl for LUQ cramping.Shereportssx'sare improved w/o the bentyl she was prescribed. She was treated in Dec 2020 for a Klebsiella UTI w/Augmentin.  Medication Sig  . acetaminophen (TYLENOL) 325 MG tablet Take 650 mg by mouth every 6 (six) hours as needed.  . B Complex-C (SUPER B COMPLEX PO) Take 1 tablet by mouth daily.   . Cholecalciferol (VITAMIN D) 2000 units tablet Take 2,000 Units by mouth daily.   . diazepam (VALIUM) 2 MG tablet Take 1/2 to 1 tablet 2 to 3 x / day for Acute Anxiety &  please try to limit to 5 days /week to avoid addiction  . fluconazole (DIFLUCAN) 150 MG tablet Take one tablet at onset of symptoms and second tablet three days later for yeast.  . fluticasone (FLONASE) 50 MCG/ACT nasal spray Place 2 sprays into both nostrils at bedtime.  Marland Kitchen latanoprost (XALATAN) 0.005 % ophthalmic solution Place 1 drop into both eyes at bedtime.   Marland Kitchen losartan-hydrochlorothiazide (HYZAAR) 100-25 MG tablet Take 1 tablet Daily for BP  . Magnesium 250 MG TABS Take 250 mg by mouth daily.  . naproxen sodium (ALEVE) 220 MG tablet Take 220 mg by mouth 2 (two) times daily as needed (for headaches or pain).  . nitroGLYCERIN (NITROSTAT) 0.4 MG SL tablet Place 1 tablet (0.4 mg total) under the tongue every 5 (five) minutes as needed for chest pain.  Marland Kitchen omeprazole (PRILOSEC) 40 MG capsule Take 1 capsule (40 mg total) by mouth 2 (two) times daily.  Marland Kitchen PROAIR HFA 108 (90 Base) MCG/ACT inhaler INHALE 2 PUFFS INTO THE LUNGS EVERY 4 HOURS AS NEEDED FOR WHEEZING OR SHORTNESS OF BREATH  . dicyclomine (BENTYL) 20 MG tablet Take 1 tablet 3 x /day before meals for Abdominal Pain (Patient not  taking: Reported on 09/15/2019)   Past Medical History:  Diagnosis Date  . Anxiety   . Chest discomfort   . GERD (gastroesophageal reflux disease)   . Hiatal hernia   . Hypertension    labile, normal cath 2013  . Osteoporosis   . Renal cell cancer ([place]) 1999   LEFT NEPHRECTOMY  . Vitamin D deficiency    Past Surgical History:  Procedure Laterality Date  . LEFT OOPHORECTOMY  1985   Benign tumor  . NEPHRECTOMY Left 1999   secondary to cancer  . TUBAL LIGATION     Review of Systems   10 point systems review negative except as above.     Objective:   Physical Exam  BP 122/70   Pulse 72   Temp 97.6 F (36.4 C)   Resp 16   Ht 5\' 1"  (1.549 m)   Wt 142 lb 3.2 oz (64.5 kg)   BMI 26.87 kg/m   HEENT - WNL. Neck - supple.  Chest - Clear equal BS. Cor - Nl HS. RRR w/o sig MGR. PP 1(+). No edema. MS- FROM w/o deformities.  Gait Nl. Neuro -  Nl w/o focal abnormalities.     Assessment & Plan:   1. Labile hypertension  - Continue BP monitoring & call if elevated  2. Irritable bowel syndrome  3. Acute cystitis without hematuria  - Urinalysis, Routine w reflex microscopic - Urine Culture

## 2019-09-15 ENCOUNTER — Other Ambulatory Visit: Payer: Self-pay

## 2019-09-15 ENCOUNTER — Ambulatory Visit (INDEPENDENT_AMBULATORY_CARE_PROVIDER_SITE_OTHER): Payer: Medicare PPO | Admitting: Internal Medicine

## 2019-09-15 VITALS — BP 122/70 | HR 72 | Temp 97.6°F | Resp 16 | Ht 61.0 in | Wt 142.2 lb

## 2019-09-15 DIAGNOSIS — N3000 Acute cystitis without hematuria: Secondary | ICD-10-CM

## 2019-09-15 DIAGNOSIS — K589 Irritable bowel syndrome without diarrhea: Secondary | ICD-10-CM

## 2019-09-15 DIAGNOSIS — R0989 Other specified symptoms and signs involving the circulatory and respiratory systems: Secondary | ICD-10-CM | POA: Diagnosis not present

## 2019-09-16 ENCOUNTER — Encounter: Payer: Self-pay | Admitting: Internal Medicine

## 2019-09-16 LAB — URINALYSIS, ROUTINE W REFLEX MICROSCOPIC
Bilirubin Urine: NEGATIVE
Glucose, UA: NEGATIVE
Hgb urine dipstick: NEGATIVE
Ketones, ur: NEGATIVE
Leukocytes,Ua: NEGATIVE
Nitrite: NEGATIVE
Protein, ur: NEGATIVE
Specific Gravity, Urine: 1.006 (ref 1.001–1.03)
pH: 7.5 (ref 5.0–8.0)

## 2019-09-16 LAB — URINE CULTURE
MICRO NUMBER:: 10072091
SPECIMEN QUALITY:: ADEQUATE

## 2019-09-25 ENCOUNTER — Other Ambulatory Visit: Payer: Self-pay | Admitting: Internal Medicine

## 2019-09-25 DIAGNOSIS — R1012 Left upper quadrant pain: Secondary | ICD-10-CM

## 2019-09-25 MED ORDER — DICYCLOMINE HCL 20 MG PO TABS: ORAL_TABLET | ORAL | 3 refills | Status: AC

## 2019-09-27 ENCOUNTER — Telehealth: Payer: Self-pay | Admitting: *Deleted

## 2019-09-27 ENCOUNTER — Other Ambulatory Visit: Payer: Self-pay | Admitting: Internal Medicine

## 2019-09-27 MED ORDER — FLUCONAZOLE 150 MG PO TABS: ORAL_TABLET | ORAL | 3 refills | Status: DC

## 2019-09-27 MED ORDER — METRONIDAZOLE 500 MG PO TABS: ORAL_TABLET | ORAL | 0 refills | Status: DC

## 2019-09-27 NOTE — Telephone Encounter (Signed)
Patient called and reported she is still having painful urination, even though her urinalysis was negative. Dr Melford Aase sent in an RX for Diflucan and Flagyl.  Patient is aware.

## 2019-11-01 ENCOUNTER — Ambulatory Visit
Admission: RE | Admit: 2019-11-01 | Discharge: 2019-11-01 | Disposition: A | Discharge status: Home / Self Care | Payer: Medicare PPO | Source: Ambulatory Visit | Attending: Physician Assistant | Admitting: Physician Assistant

## 2019-11-01 ENCOUNTER — Other Ambulatory Visit: Payer: Self-pay

## 2019-11-01 DIAGNOSIS — M810 Age-related osteoporosis without current pathological fracture: Secondary | ICD-10-CM

## 2019-11-01 DIAGNOSIS — Z1231 Encounter for screening mammogram for malignant neoplasm of breast: Secondary | ICD-10-CM | POA: Diagnosis not present

## 2019-11-02 DIAGNOSIS — Z9889 Other specified postprocedural states: Secondary | ICD-10-CM | POA: Diagnosis not present

## 2019-11-02 DIAGNOSIS — H35371 Puckering of macula, right eye: Secondary | ICD-10-CM | POA: Diagnosis not present

## 2019-11-02 DIAGNOSIS — H2511 Age-related nuclear cataract, right eye: Secondary | ICD-10-CM | POA: Diagnosis not present

## 2019-11-02 DIAGNOSIS — H40053 Ocular hypertension, bilateral: Secondary | ICD-10-CM | POA: Diagnosis not present

## 2019-11-02 DIAGNOSIS — Z961 Presence of intraocular lens: Secondary | ICD-10-CM | POA: Diagnosis not present

## 2019-11-02 NOTE — Progress Notes (Addendum)
Medicare Wellness  Assessment:    Chronic nonintractable headache, unspecified headache type -     cyclobenzaprine (FLEXERIL) 10 MG tablet; Take 1 tablet (10 mg total) by mouth at bedtime as needed (TMJ/jaw pain). - normal neuro-? From TMJ due to dentures versus cervical neck pain.  - if not better may refer to ortho.  Will go to the ER if worsening headache, changes vision/speech, imbalance, weakness.  Thrush -     nystatin (MYCOSTATIN) 100000 UNIT/ML suspension; 5 ml four times a day, retain in mouth as long as possible (Swish and Spit).  Use for 48 hours after symptoms resolve. - if not better will follow up with dentist.   Neck pain -     cyclobenzaprine (FLEXERIL) 10 MG tablet; Take 1 tablet (10 mg total) by mouth at bedtime as needed (TMJ/jaw pain). -     DG Cervical Spine Complete; Future Normal neuro ? From dentures/TMJ versus from OA neck with cervogenic HA- will get imaging, do muscle relaxer and follow up with dentist, if not better will refer to ortho.  - Go to the ER if you have any new weakness in your arms, trouble with your grip, worse headache ever, fever, chills, dizziness, changes in vision or have worsening pain.   Atherosclerosis of aorta (HCC) Control blood pressure, cholesterol, glucose, increase exercise.   Labile hypertension - continue medications, DASH diet, exercise and monitor at home. Call if greater than 130/80.  -     CBC with Differential/Platelet -     BASIC METABOLIC PANEL WITH GFR -     Hepatic function panel -     TSH  Neuropathy Improved  Hyperlipidemia, unspecified hyperlipidemia type -continue medications, check lipids, decrease fatty foods, increase activity.  -     Lipid panel  Abnormal glucose Discussed general issues about diabetes pathophysiology and management., Educational material distributed., Suggested low cholesterol diet., Encouraged aerobic exercise., Discussed foot care., Reminded to get yearly retinal exam. -      Hemoglobin A1c  History of renal cell cancer monitor  Osteoporosis, unspecified osteoporosis type, unspecified pathological fracture presence continue Vit D and Ca, weight bearing exercises  Hiatal hernia Continue PPI/H2 blocker, diet discussed  Gastroesophageal reflux disease with esophagitis Continue PPI/H2 blocker, diet discussed  Anxiety continue medications, stress management techniques discussed, increase water, good sleep hygiene discussed, increase exercise, and increase veggies.   Vitamin D deficiency Continue supplement  Memory changes Follows Neuro Cut back on valium use  Medication management -     Magnesium   Over 40 minutes of exam, counseling, chart review and critical decision making was performed Future Appointments  Date Time Provider [place]  12/04/2019 11:15 AM Vicie Mutters, PA-C [facility] None  03/05/2020 10:00 AM Vicie Mutters, PA-C [facility] None      Subjective:  Kimberly Wilcox is a 74 y.o. female who presents neck pain and wellness visit.   She is having neck pain x 1 year, feels left posterior ear down her neck and some on the other side.  She is going on vacation next week to [facility].  Denies weakness in his arms, trouble with grip, worse headache ever but has been having daily headaches. Denies fever, chills.  Has been having ear pain, left worse than right, did improve with ABX for UTI. Not worse with brushing teeth, no blurry vision/changes in vision.  Has decreased hearing left ear with tinnitus, a little off balance occ but no dizziness, no falls.  Denies clinching of her  teeth/TMJ.  Saw her dentist a few years ago.  Left 2nd finger had numbness.   Xray 2014 Findings: Vertebral body height is normal.  Trace anterolisthesis of C4 on C5 due to facet arthropathy is noted.  Neural foramina appear patent.  Prevertebral soft tissues appear normal.  Lung apices are clear.  IMPRESSION: No acute finding.  Facet  arthropathy causes trace anterolisthesis C4 on C5.  She has had a lot of anxiety, she normally travels but has not been able to. She has been very anxious. Can not tolerate SSRI, will take valium 1/2 pill, taking 1 pill every other day which is more than ususal due to coronavirus anxiety.   She continues to have AB discomfort, upper GI issues, last colonoscopy and EGD in 2017.  Follows with Dr. Watt Climes. Had slightly abnormal HIDA in 2018. Had CT AB dec 2020 IMPRESSION: Changes of prior granulomatous disease.  Mild distention of the gallbladder without complicating factors. Correlation with laboratory values is recommended  Status post left nephrectomy.  Nonobstructing right renal stone.  Her blood pressure has been controlled at home, today their BP is BP: 120/80  She does workout, walking and states that her neuropathy has improved. She denies chest pain, shortness of breath, dizziness.  She is not on cholesterol medication and denies myalgias. Her cholesterol is at goal. The cholesterol last visit was:   Lab Results  Component Value Date   CHOL 215 (H) 08/01/2019   HDL 78 08/01/2019   LDLCALC 117 (H) 08/01/2019   TRIG 96 08/01/2019   CHOLHDL 2.8 08/01/2019    She has been working on diet and exercise for prediabetes, and denies paresthesia of the feet, polydipsia, polyuria and visual disturbances. Last A1C in the office was:  Lab Results  Component Value Date   HGBA1C 5.6 08/01/2019   Lab Results  Component Value Date   GFRNONAA 84 11/03/2019   Patient is on Vitamin D supplement.   Lab Results  Component Value Date   VD25OH 34 08/01/2019   BMI is Body mass index is 26.94 kg/m., she is working on diet and exercise. Wt Readings from Last 3 Encounters:  11/03/19 142 lb 9.6 oz (64.7 kg)  09/15/19 142 lb 3.2 oz (64.5 kg)  08/30/19 145 lb 9.6 oz (66 kg)       Medication Review:   Current Outpatient Medications (Cardiovascular):  .  losartan-hydrochlorothiazide  (HYZAAR) 100-25 MG tablet, Take 1 tablet Daily for BP .  nitroGLYCERIN (NITROSTAT) 0.4 MG SL tablet, Place 1 tablet (0.4 mg total) under the tongue every 5 (five) minutes as needed for chest pain.  Current Outpatient Medications (Respiratory):  .  fluticasone (FLONASE) 50 MCG/ACT nasal spray, Place 2 sprays into both nostrils at bedtime. Marland Kitchen  PROAIR HFA 108 (90 Base) MCG/ACT inhaler, INHALE 2 PUFFS INTO THE LUNGS EVERY 4 HOURS AS NEEDED FOR WHEEZING OR SHORTNESS OF BREATH  Current Outpatient Medications (Analgesics):  .  acetaminophen (TYLENOL) 325 MG tablet, Take 650 mg by mouth every 6 (six) hours as needed. .  naproxen sodium (ALEVE) 220 MG tablet, Take 220 mg by mouth 2 (two) times daily as needed (for headaches or pain).   Current Outpatient Medications (Other):  Marland Kitchen  B Complex-C (SUPER B COMPLEX PO), Take 1 tablet by mouth daily.  .  Cholecalciferol (VITAMIN D) 2000 units tablet, Take 2,000 Units by mouth daily.  .  diazepam (VALIUM) 2 MG tablet, Take 1/2 to 1 tablet 2 to 3 x / day for Acute Anxiety &  please try to limit to 5 days /week to avoid addiction .  dicyclomine (BENTYL) 20 MG tablet, Take 1 tablet 3 x /day before meals for Abdominal Discomfort, Cramping, Nausea, Bloating or Diarrhea .  latanoprost (XALATAN) 0.005 % ophthalmic solution, Place 1 drop into both eyes at bedtime.  .  Magnesium 250 MG TABS, Take 250 mg by mouth daily. Marland Kitchen  omeprazole (PRILOSEC) 40 MG capsule, Take 1 capsule (40 mg total) by mouth 2 (two) times daily. .  cyclobenzaprine (FLEXERIL) 10 MG tablet, Take 1 tablet (10 mg total) by mouth at bedtime as needed (TMJ/jaw pain). .  nystatin (MYCOSTATIN) 100000 UNIT/ML suspension, 5 ml four times a day, retain in mouth as long as possible (Swish and Spit).  Use for 48 hours after symptoms resolve.   Allergies  Allergen Reactions  . Meloxicam Itching  . Nitrofurantoin Itching and Other (See Comments)    "Flu-like" symptoms, also  . Nitrofurantoin Monohyd Macro  Itching and Other (See Comments)    "Flu-like" symptoms, also  . Sulfa Antibiotics Hives and Itching    Current Problems (verified) Patient Active Problem List   Diagnosis Date Noted  . Atherosclerosis of aorta ([place]) 04/14/2018  . Neuropathy 01/09/2015  . Hyperlipidemia 10/29/2014  . Abnormal glucose 10/29/2014  . History of renal cell cancer 10/29/2014  . Labile hypertension 10/29/2014  . Osteoporosis 10/29/2014  . Vitamin D deficiency   . GERD (gastroesophageal reflux disease)   . Anxiety   . Hiatal hernia     Screening Tests Immunization History  Administered Date(s) Administered  . Influenza, High Dose Seasonal PF 06/22/2017, 06/14/2018, 06/19/2019  . Influenza, Seasonal, Injecte, Preservative Fre 07/28/2016  . Pneumococcal Conjugate-13 09/17/2015  . Pneumococcal Polysaccharide-23 10/04/2012  . Td 10/04/2009   Preventative care: Last colonoscopy:01/2016, Dr. Amedeo Plenty EGD 2017 HIDA 06/2017 was decreased Last mammogram: 10/2019 Last pap smear/pelvic exam: 2011  remote DEXA: 10/2019 osteopenia MRI brain 10/2014 Ct AB 99991111 [place] carotids LICA [id number] [place] AB [id number] CXR 2017  Prior vaccinations: TD or Tdap: 2011 DUE will get tetanus next OV Influenza: 2020 Pneumococcal: 2014 Prevnar13: 2017 Shingles/Zostavax: declines COVID not interested at this time  Names of Other Physician/Practitioners you currently use: 1. [facility] here for primary care 2. Dr. Katy Fitch, eye doctor, last visit 2020 3. Gwyndolyn Saxon, dentist, last visit 2019 Patient Care Team: Unk Pinto, MD as PCP - General (Internal Medicine) Warden Fillers, MD as Consulting Physician (Optometry) Josue Hector, MD as Consulting Physician (Cardiology) Alexis Frock, MD as Consulting Physician (Urology) Teena Irani, MD (Inactive) as Consulting Physician (Gastroenterology)  SURGICAL HISTORY She  has a past surgical history that includes Nephrectomy (Left, 1999);  Left oophorectomy (1985); and Tubal ligation. FAMILY HISTORY Her family history includes Lupus in her granddaughter; Other in her brother; Pneumonia in her father. SOCIAL HISTORY She  reports that she quit smoking about 35 years ago. She has never used smokeless tobacco. She reports current alcohol use. She reports that she does not use drugs. Wine once a week  MEDICARE WELLNESS OBJECTIVES: Physical activity: Exercise limited by: cardiac condition(s) Cardiac risk factors: Cardiac Risk Factors include: advanced age (>24men, >40 women);diabetes mellitus;dyslipidemia;hypertension Depression/mood screen:   Depression screen [facility] 2/9 11/06/2019  Decreased Interest 0  Down, Depressed, Hopeless 0  PHQ - 2 Score 0    ADLs:  In your present state of health, do you have any difficulty performing the following activities: 11/06/2019 09/16/2019  Hearing? N N  Vision? N N  Difficulty concentrating  or making decisions? N N  Walking or climbing stairs? N N  Dressing or bathing? N N  Doing errands, shopping? N N  Some recent data might be hidden     Cognitive Testing  Alert? Yes  Normal Appearance?Yes  Oriented to person? Yes  Place? Yes   Time? Yes  Recall of three objects?  Yes  Can perform simple calculations? Yes  Displays appropriate judgment?Yes  Can read the correct time from a watch face?Yes  EOL planning: Does Patient Have a Medical Advance Directive?: Yes Does patient want to make changes to medical advance directive?: No - Patient declined    Review of Systems  Constitutional: Negative.   HENT: Negative.   Eyes: Negative for blurred vision, double vision, photophobia, pain, discharge and redness.  Respiratory: Negative.   Cardiovascular: Negative.   Gastrointestinal: Positive for abdominal pain. Negative for blood in stool, constipation, diarrhea, heartburn, melena, nausea and vomiting.  Genitourinary: Positive for frequency. Negative for dysuria, flank pain, hematuria and  urgency.  Musculoskeletal: Positive for neck pain.  Skin: Negative.   Neurological: Positive for sensory change (bilateral feet). Negative for dizziness, tingling, tremors, speech change, focal weakness, seizures and loss of consciousness.  Psychiatric/Behavioral: Negative for depression, hallucinations, memory loss, substance abuse and suicidal ideas. The patient is not nervous/anxious and does not have insomnia.      Objective:     Today's Vitals   11/03/19 1111  BP: 120/80  Pulse: 86  Temp: 97.7 F (36.5 C)  SpO2: 97%  Weight: 142 lb 9.6 oz (64.7 kg)   Body mass index is 26.94 kg/m.  General appearance: alert, no distress, WD/WN, female HEENT: normocephalic, sclerae anicteric, TMs pearly, nares patent, no discharge or erythema, pharynx normal Oral cavity: MMM, white plaque on upper right gum, not scrapable. Neck: supple, no lymphadenopathy, no thyromegaly, no masses Heart: RRR, normal S1, S2, no murmurs Lungs: CTA bilaterally, no wheezes, rhonchi, or rales Abdomen: +bs, soft, diffuse tenderness, non distended, no masses, no hepatomegaly, no splenomegaly Musculoskeletal: nontender, no swelling, no obvious deformity, normal range of motion, without spinous process tenderness, with paraspinal muscle tenderness the left side, normal sensation, reflexes, and pulses distal. + TMJ tenderness Extremities: no edema, no cyanosis, no clubbing Pulses: 2+ symmetric, upper and lower extremities, normal cap refill Neurological: alert, oriented x 3, CN2-12 intact, strength normal upper extremities and lower extremities, sensation decreased bilateral feet, DTRs 2+ throughout, no cerebellar signs, gait normal Psychiatric: normal affect, behavior normal, pleasant    Medicare Attestation I have personally reviewed: The patient's medical and social history Their use of alcohol, tobacco or illicit drugs Their current medications and supplements The patient's functional ability including  ADLs,fall risks, home safety risks, cognitive, and hearing and visual impairment Diet and physical activities Evidence for depression or mood disorders  The patient's weight, height, BMI, and visual acuity have been recorded in the chart.  I have made referrals, counseling, and provided education to the patient based on review of the above and I have provided the patient with a written personalized care plan for preventive services.     Vicie Mutters, PA-C   11/06/2019

## 2019-11-03 ENCOUNTER — Encounter: Payer: Self-pay | Admitting: Physician Assistant

## 2019-11-03 ENCOUNTER — Ambulatory Visit: Payer: Medicare PPO | Admitting: Physician Assistant

## 2019-11-03 ENCOUNTER — Other Ambulatory Visit: Payer: Self-pay

## 2019-11-03 VITALS — BP 120/80 | HR 86 | Temp 97.7°F | Wt 142.6 lb

## 2019-11-03 DIAGNOSIS — K449 Diaphragmatic hernia without obstruction or gangrene: Secondary | ICD-10-CM | POA: Diagnosis not present

## 2019-11-03 DIAGNOSIS — Z0001 Encounter for general adult medical examination with abnormal findings: Secondary | ICD-10-CM

## 2019-11-03 DIAGNOSIS — E785 Hyperlipidemia, unspecified: Secondary | ICD-10-CM

## 2019-11-03 DIAGNOSIS — G8929 Other chronic pain: Secondary | ICD-10-CM

## 2019-11-03 DIAGNOSIS — M810 Age-related osteoporosis without current pathological fracture: Secondary | ICD-10-CM

## 2019-11-03 DIAGNOSIS — E559 Vitamin D deficiency, unspecified: Secondary | ICD-10-CM

## 2019-11-03 DIAGNOSIS — Z0000 Encounter for general adult medical examination without abnormal findings: Secondary | ICD-10-CM

## 2019-11-03 DIAGNOSIS — R7309 Other abnormal glucose: Secondary | ICD-10-CM

## 2019-11-03 DIAGNOSIS — R109 Unspecified abdominal pain: Secondary | ICD-10-CM

## 2019-11-03 DIAGNOSIS — G629 Polyneuropathy, unspecified: Secondary | ICD-10-CM | POA: Diagnosis not present

## 2019-11-03 DIAGNOSIS — I700 Atherosclerosis of aorta: Secondary | ICD-10-CM

## 2019-11-03 DIAGNOSIS — R6889 Other general symptoms and signs: Secondary | ICD-10-CM | POA: Diagnosis not present

## 2019-11-03 DIAGNOSIS — R350 Frequency of micturition: Secondary | ICD-10-CM | POA: Diagnosis not present

## 2019-11-03 DIAGNOSIS — K2100 Gastro-esophageal reflux disease with esophagitis, without bleeding: Secondary | ICD-10-CM

## 2019-11-03 DIAGNOSIS — Z23 Encounter for immunization: Secondary | ICD-10-CM

## 2019-11-03 DIAGNOSIS — M542 Cervicalgia: Secondary | ICD-10-CM

## 2019-11-03 DIAGNOSIS — F419 Anxiety disorder, unspecified: Secondary | ICD-10-CM | POA: Diagnosis not present

## 2019-11-03 DIAGNOSIS — R0989 Other specified symptoms and signs involving the circulatory and respiratory systems: Secondary | ICD-10-CM

## 2019-11-03 DIAGNOSIS — Z85528 Personal history of other malignant neoplasm of kidney: Secondary | ICD-10-CM

## 2019-11-03 DIAGNOSIS — B370 Candidal stomatitis: Secondary | ICD-10-CM

## 2019-11-03 DIAGNOSIS — R519 Headache, unspecified: Secondary | ICD-10-CM

## 2019-11-03 MED ORDER — NYSTATIN 100000 UNIT/ML MT SUSP: OROMUCOSAL | 0 refills | Status: DC

## 2019-11-03 MED ORDER — CYCLOBENZAPRINE HCL 10 MG PO TABS: 10.0000 mg | ORAL_TABLET | Freq: Every evening | ORAL | 1 refills | Status: AC | PRN

## 2019-11-03 NOTE — Patient Instructions (Addendum)
What is the TMJ?  GO TO THE DENTIST TAKE DENTURES OUT AND REST DURING THE DAY SOFT FOODS CAN DO MUSCLE RELAXER AT NIGHT X 2 WEEKS CAN DO HEATING PAD/MASSAGE  WILL SEND IN LIQUID FOR POSSIBLE YEAST IN MOUTH, IF NOT BETTER FOLLOW UP FOR DENTIST.   Go to the ER if you have any new weakness in your arms, trouble with your grip, worse headache ever, fever, chills. or have worsening pain.  She was informed to call 911 if she develop any new symptoms such as worsening headaches, episodes of blurred vision, double vision or complete loss of vision or speech difficulties or motor weakness.   The temporomandibular (tem-PUH-ro-man-DIB-yoo-ler) joint, or the TMJ, connects the upper and lower jawbones. This joint allows the jaw to open wide and move back and forth when you chew, talk, or yawn.There are also several muscles that help this joint move. There can be muscle tightness and pain in the muscle that can cause several symptoms.  What causes TMJ pain? There are many causes of TMJ pain. Repeated chewing (for example, chewing gum) and clenching your teeth can cause pain in the joint. Some TMJ pain has no obvious cause. What can I do to ease the pain? There are many things you can do to help your pain get better. When you have pain:  Eat soft foods and stay away from chewy foods (for example, taffy) Try to use both sides of your mouth to chew Don't chew gum Massage Don't open your mouth wide (for example, during yawning or singing) Don't bite your cheeks or fingernails Lower your amount of stress and worry Applying a warm, damp washcloth to the joint may help. Over-the-counter pain medicines such as ibuprofen (one brand: Advil) or acetaminophen (one brand: Tylenol) might also help. Do not use these medicines if you are allergic to them or if your doctor told you not to use them. How can I stop the pain from coming back? When your pain is better, you can do these exercises to make your muscles  stronger and to keep the pain from coming back:  Resisted mouth opening: Place your thumb or two fingers under your chin and open your mouth slowly, pushing up lightly on your chin with your thumb. Hold for three to six seconds. Close your mouth slowly. Resisted mouth closing: Place your thumbs under your chin and your two index fingers on the ridge between your mouth and the bottom of your chin. Push down lightly on your chin as you close your mouth. Tongue up: Slowly open and close your mouth while keeping the tongue touching the roof of the mouth. Side-to-side jaw movement: Place an object about one fourth of an inch thick (for example, two tongue depressors) between your front teeth. Slowly move your jaw from side to side. Increase the thickness of the object as the exercise becomes easier Forward jaw movement: Place an object about one fourth of an inch thick between your front teeth and move the bottom jaw forward so that the bottom teeth are in front of the top teeth. Increase the thickness of the object as the exercise becomes easier. These exercises should not be painful. If it hurts to do these exercises, stop doing them and talk to your family doctor.   Gallbladder Eating Plan If you have a gallbladder condition, you may have trouble digesting fats. Eating a low-fat diet can help reduce your symptoms, and may be helpful before and after having surgery to remove your  gallbladder (cholecystectomy). Your health care provider may recommend that you work with a diet and nutrition specialist (dietitian) to help you reduce the amount of fat in your diet. What are tips for following this plan? General guidelines  Limit your fat intake to less than 30% of your total daily calories. If you eat around 1,800 calories each day, this is less than 60 grams (g) of fat per day.  Fat is an important part of a healthy diet. Eating a low-fat diet can make it hard to maintain a healthy body weight. Ask your  dietitian how much fat, calories, and other nutrients you need each day.  Eat small, frequent meals throughout the day instead of three large meals.  Drink at least 8-10 cups of fluid a day. Drink enough fluid to keep your urine clear or pale yellow.  Limit alcohol intake to no more than 1 drink a day for nonpregnant women and 2 drinks a day for men. One drink equals 12 oz of beer, 5 oz of wine, or 1 oz of hard liquor. Reading food labels  Check Nutrition Facts on food labels for the amount of fat per serving. Choose foods with less than 3 grams of fat per serving. Shopping  Choose nonfat and low-fat healthy foods. Look for the words "nonfat," "low fat," or "fat free."  Avoid buying processed or prepackaged foods. Cooking  Cook using low-fat methods, such as baking, broiling, grilling, or boiling.  Cook with small amounts of healthy fats, such as olive oil, grapeseed oil, canola oil, or sunflower oil. What foods are recommended?   All fresh, frozen, or canned fruits and vegetables.  Whole grains.  Low-fat or non-fat (skim) milk and yogurt.  Lean meat, skinless poultry, fish, eggs, and beans.  Low-fat protein supplement powders or drinks.  Spices and herbs. What foods are not recommended?  High-fat foods. These include baked goods, fast food, fatty cuts of meat, ice cream, french toast, sweet rolls, pizza, cheese bread, foods covered with butter, creamy sauces, or cheese.  Fried foods. These include french fries, tempura, battered fish, breaded chicken, fried breads, and sweets.  Foods with strong odors.  Foods that cause bloating and gas. Summary  A low-fat diet can be helpful if you have a gallbladder condition, or before and after gallbladder surgery.  Limit your fat intake to less than 30% of your total daily calories. This is about 60 g of fat if you eat 1,800 calories each day.  Eat small, frequent meals throughout the day instead of three large meals. This  information is not intended to replace advice given to you by your health care provider. Make sure you discuss any questions you have with your health care provider. Document Revised: 12/01/2018 Document Reviewed: 09/17/2016 Elsevier Patient Education  [employer].   Here is some information about the vaccines. The data is very good and this information hopefully answers a lot of your questions and give you a confidence boost.   The Pfizer and Moderna vaccines are messenger RNA vaccines. That technology is not new, it has been studied for 20 years at least, used for cancer and MS treatment. They had started using the vaccine for MERS AND SARS (both a different coronavirus) 10 years ago but never finished so we had a good backbone for this vaccine.   There were no short cuts with the techniques for these clinical  trials, just lots of willing participants quickly and lots of up front money helped speed up  the process.   The mRNA is very fragile which is why it needs to be kept so cold and thawed a certain way, think of it as a message in a glass bottle. NO PART of the virus is in this vaccine, it is a clip of the genetic sequence. This mRNA is injected in your arm, connects with a ribosome, delivers the message and the degrades.  That is part of the cause of the sore arm, the mRNA never leaves your arm. It degrades there. The mRNA does not go into our nucleotide where our DNA is, and we would need a DNA reverse transcriptase to take RNA to DNA, we do not have this, it can not change our DNA. The ribosome that got the message creates a protein and that protein circulates in our body and we have an immune reaction to that creating antibodies. Any time our immune system is triggered, inflammation is triggered too so you can have a temp, muscle aches, etc. Normal reaction.  I have seen so many patients that just had mild COVID in the office weeks later still have issues. We are still learning about  post COVID syndrome, the CDC should be coming out for guidelines for practioners soon. There is too much unknown about COVID. We have been using vaccines for over 100 years or more, i Personnel officer. Ask your parents or any older friends about polio and that vaccine, that was a disease shutting down schools,had kids in iron lung, devastating young kids and families. People lined up for that vaccine and technology has only improved.   Please get the vaccine. If you have any further questions please make an appointment in the office to discuss further.  Estill Bamberg

## 2019-11-04 LAB — COMPLETE METABOLIC PANEL WITH GFR
AG Ratio: 1.6 (calc) (ref 1.0–2.5)
ALT: 6 U/L (ref 6–29)
AST: 19 U/L (ref 10–35)
Albumin: 4.4 g/dL (ref 3.6–5.1)
Alkaline phosphatase (APISO): 61 U/L (ref 37–153)
BUN: 12 mg/dL (ref 7–25)
CO2: 26 mmol/L (ref 20–32)
Calcium: 9.6 mg/dL (ref 8.6–10.4)
Chloride: 103 mmol/L (ref 98–110)
Creat: 0.71 mg/dL (ref 0.60–0.93)
GFR, Est African American: 97 mL/min/{1.73_m2} (ref >=60)
GFR, Est Non African American: 84 mL/min/{1.73_m2} (ref >=60)
Globulin: 2.7 g/dL (calc) (ref 1.9–3.7)
Glucose, Bld: 82 mg/dL (ref 65–99)
Potassium: 4.3 mmol/L (ref 3.5–5.3)
Sodium: 137 mmol/L (ref 135–146)
Total Bilirubin: 0.5 mg/dL (ref 0.2–1.2)
Total Protein: 7.1 g/dL (ref 6.1–8.1)

## 2019-11-04 LAB — URINALYSIS, ROUTINE W REFLEX MICROSCOPIC
Bilirubin Urine: NEGATIVE
Glucose, UA: NEGATIVE
Hgb urine dipstick: NEGATIVE
Leukocytes,Ua: NEGATIVE
Nitrite: NEGATIVE
Protein, ur: NEGATIVE
Specific Gravity, Urine: 1.006 (ref 1.001–1.03)
pH: 5.5 (ref 5.0–8.0)

## 2019-11-04 LAB — CBC WITH DIFFERENTIAL/PLATELET
Absolute Monocytes: 422 cells/uL (ref 200–950)
Basophils Absolute: 33 cells/uL (ref 0–200)
Basophils Relative: 0.5 %
Eosinophils Absolute: 99 cells/uL (ref 15–500)
Eosinophils Relative: 1.5 %
HCT: 41.5 % (ref 35.0–45.0)
Hemoglobin: 13.3 g/dL (ref 11.7–15.5)
Lymphs Abs: 1921 cells/uL (ref 850–3900)
MCH: 27.1 pg (ref 27.0–33.0)
MCHC: 32 g/dL (ref 32.0–36.0)
MCV: 84.7 fL (ref 80.0–100.0)
MPV: 10.1 fL (ref 7.5–12.5)
Monocytes Relative: 6.4 %
Neutro Abs: 4125 cells/uL (ref 1500–7800)
Neutrophils Relative %: 62.5 %
Platelets: 265 10*3/uL (ref 140–400)
RBC: 4.9 10*6/uL (ref 3.80–5.10)
RDW: 13.2 % (ref 11.0–15.0)
Total Lymphocyte: 29.1 %
WBC: 6.6 10*3/uL (ref 3.8–10.8)

## 2019-11-04 LAB — URINE CULTURE
MICRO NUMBER:: 10246930
Result:: NO GROWTH
SPECIMEN QUALITY:: ADEQUATE

## 2019-11-13 ENCOUNTER — Encounter: Payer: Medicare Other | Admitting: Physician Assistant

## 2019-11-17 ENCOUNTER — Ambulatory Visit: Payer: Medicare Other | Admitting: Physician Assistant

## 2019-12-04 ENCOUNTER — Ambulatory Visit: Payer: Medicare Other | Admitting: Physician Assistant

## 2019-12-30 DIAGNOSIS — Z20822 Contact with and (suspected) exposure to covid-19: Secondary | ICD-10-CM | POA: Diagnosis not present

## 2019-12-30 DIAGNOSIS — R05 Cough: Secondary | ICD-10-CM | POA: Diagnosis not present

## 2019-12-30 DIAGNOSIS — R5381 Other malaise: Secondary | ICD-10-CM | POA: Diagnosis not present

## 2019-12-30 DIAGNOSIS — R110 Nausea: Secondary | ICD-10-CM | POA: Diagnosis not present

## 2020-01-04 ENCOUNTER — Ambulatory Visit: Payer: Medicare PPO | Admitting: Physician Assistant

## 2020-01-26 ENCOUNTER — Ambulatory Visit: Payer: Medicare PPO | Admitting: Physician Assistant

## 2020-02-15 ENCOUNTER — Encounter: Payer: Medicare Other | Admitting: Physician Assistant

## 2020-02-22 ENCOUNTER — Other Ambulatory Visit: Payer: Self-pay | Admitting: Physician Assistant

## 2020-02-22 ENCOUNTER — Other Ambulatory Visit: Payer: Medicare PPO

## 2020-02-22 ENCOUNTER — Other Ambulatory Visit: Payer: Self-pay

## 2020-02-22 DIAGNOSIS — R350 Frequency of micturition: Secondary | ICD-10-CM | POA: Diagnosis not present

## 2020-02-23 LAB — URINALYSIS, ROUTINE W REFLEX MICROSCOPIC
Bilirubin Urine: NEGATIVE
Glucose, UA: NEGATIVE
Hgb urine dipstick: NEGATIVE
Ketones, ur: NEGATIVE
Leukocytes,Ua: NEGATIVE
Nitrite: NEGATIVE
Protein, ur: NEGATIVE
Specific Gravity, Urine: 1.003 (ref 1.001–1.03)
pH: 7 (ref 5.0–8.0)

## 2020-02-23 LAB — URINE CULTURE
MICRO NUMBER:: 10658885
Result:: NO GROWTH
SPECIMEN QUALITY:: ADEQUATE

## 2020-02-28 ENCOUNTER — Other Ambulatory Visit: Payer: Self-pay | Admitting: Physician Assistant

## 2020-02-28 ENCOUNTER — Encounter: Payer: Medicare Other | Admitting: Physician Assistant

## 2020-02-28 DIAGNOSIS — R110 Nausea: Secondary | ICD-10-CM | POA: Diagnosis not present

## 2020-02-28 DIAGNOSIS — R1011 Right upper quadrant pain: Secondary | ICD-10-CM | POA: Diagnosis not present

## 2020-02-28 DIAGNOSIS — K219 Gastro-esophageal reflux disease without esophagitis: Secondary | ICD-10-CM | POA: Diagnosis not present

## 2020-02-28 DIAGNOSIS — K5901 Slow transit constipation: Secondary | ICD-10-CM | POA: Diagnosis not present

## 2020-03-04 NOTE — Progress Notes (Signed)
COMPLETE PHYSICAL  Assessment:   Kimberly Wilcox was seen today for annual exam.  Diagnoses and all orders for this visit:  Encounter for annual physical exam -     EKG 12-Lead  Chronic nonintractable headache, unspecified headache type        Managing at this time  cyclobenzaprine (FLEXERIL) 10 MG tablet; Take 1 tablet (10 mg total) by mouth at bedtime as needed (TMJ/jaw pain). - normal neuro- From TMJ  Or due to dentures Will go to the ER if worsening headache, changes vision/speech, imbalance, weakness.   Labile hypertension Continue current medications: losartan/HCTZ 100/25mg  Monitor blood pressure at home; call if consistently over 130/80 Continue DASH diet.   Reminder to go to the ER if any CP, SOB, nausea, dizziness, severe HA, changes vision/speech, left arm numbness and tingling and jaw pain. -     CBC with Differential/Platelet -     COMPLETE METABOLIC PANEL WITH GFR -     EKG 12-Lead -     TSH  Hyperlipidemia, unspecified hyperlipidemia type Continue medications: Discussed dietary and exercise modifications Low fat diet -     Lipid panel  Atherosclerosis of aorta (HCC) Control blood pressure, lipids and glucose Disscused lifestyle modifications, diet & exercise Continue to monitor  Gastroesophageal reflux disease with esophagitis without hemorrhage Doing well at this time Continue: prilosec 40mg  Diet discussed Monitor for triggers Avoid food with high acid content Avoid excessive cafeine Increase water intake  Abnormal glucose Discussed dietary and exercise modifications -     Hemoglobin A1c  Vitamin D deficiency Continue supplementation to maintain goal of 60-100 Taking Vitamin D 2,000 IU daily   Hiatal hernia Follows with GI  IBS-C Taking Linzess Improved on medication Follow up with GI Has dicyclomine (Bentyl) patient know using but has symptoms. Discussed medication and its use, take prior to meals and monitor for  improvement   Anxiety Doing well on current regiment Continue diazepam 2mg  1/2-1 tab PRN Discussed stress management techniques   Discussed good sleep hygiene Discussed increasing physical activity and exercise Increase water intake  Osteoporosis, unspecified osteoporosis type, unspecified pathological fracture presence Taking Calcium & Vit D  Dysuria Increase water intake Monitor symptoms -     Urinalysis w microscopic + reflex cultur Vaginal atrophy? -     REFLEXIVE URINE CULTURE  BMI 27.0-27.9)  Discussed dietary and exercise modifications  Medication management Continued  Over 40 minutes of face to face interview exam, counseling, chart review and critical decision making was performed Future Appointments  Date Time Provider [place]  03/11/2020 12:30 PM [facility] 2 [facility]  06/12/2020  9:30 AM Unk Pinto, MD [facility] None  03/11/2021 10:00 AM [name], NP [facility] None      HPI:  Kimberly Wilcox is a 74 y.o. female who presents for complete physical.  She has history of anxiety, and reports this is well controlled on her current regimen.  She previously had an increase in anxiety related to coronavirus. Can not tolerate SSRI but does use valium 1/2 pill, taking 1 pill every other day as needed.  She continues to have AB discomfort, upper GI issues, last colonoscopy and EGD in 2017.  Follows with Dr. Watt Climes. Had slightly abnormal HIDA in 2018. Had CT AB dec 2020 IMPRESSION: Changes of prior granulomatous disease.  Has upcoming abdominal ultrasound and hida scheduled.  Status post left nephrectomy.  Nonobstructing right renal stone. She is taking Linzess for constipation and reports this is working well for her and she  is having a bowel movement daily.  She reports that she is having significant bloating gas that is intermittent and seems to be related to meals.  She has dicyclomine on her medication list but reports she is not  taking this and not sure what it is for.  Her blood pressure has been controlled at home, today their BP is BP: 124/82  She does workout, walking and states that her neuropathy has improved. She denies chest pain, shortness of breath, dizziness.  She is not on cholesterol medication and denies myalgias. Her cholesterol is at goal. The cholesterol last visit was:   Lab Results  Component Value Date   CHOL 212 (H) 03/05/2020   HDL 80 03/05/2020   LDLCALC 111 (H) 03/05/2020   TRIG 106 03/05/2020   CHOLHDL 2.7 03/05/2020    She has been working on diet and exercise for prediabetes, and denies paresthesia of the feet, polydipsia, polyuria and visual disturbances. Last A1C in the office was:  Lab Results  Component Value Date   HGBA1C 5.4 03/05/2020   Lab Results  Component Value Date   GFRNONAA 73 03/05/2020   Patient is on Vitamin D supplement.   Lab Results  Component Value Date   VD25OH 34 08/01/2019   BMI is Body mass index is 27.28 kg/m., she is working on diet and exercise. Wt Readings from Last 3 Encounters:  03/05/20 144 lb 6.4 oz (65.5 kg)  11/03/19 142 lb 9.6 oz (64.7 kg)  09/15/19 142 lb 3.2 oz (64.5 kg)       Medication Review:   Current Outpatient Medications (Cardiovascular):  .  losartan-hydrochlorothiazide (HYZAAR) 100-25 MG tablet, Take 1 tablet Daily for BP .  nitroGLYCERIN (NITROSTAT) 0.4 MG SL tablet, Place 1 tablet (0.4 mg total) under the tongue every 5 (five) minutes as needed for chest pain.  Current Outpatient Medications (Respiratory):  .  fluticasone (FLONASE) 50 MCG/ACT nasal spray, Place 2 sprays into both nostrils at bedtime. Marland Kitchen  PROAIR HFA 108 (90 Base) MCG/ACT inhaler, INHALE 2 PUFFS INTO THE LUNGS EVERY 4 HOURS AS NEEDED FOR WHEEZING OR SHORTNESS OF BREATH  Current Outpatient Medications (Analgesics):  .  acetaminophen (TYLENOL) 325 MG tablet, Take 650 mg by mouth every 6 (six) hours as needed. .  naproxen sodium (ALEVE) 220 MG tablet, Take  220 mg by mouth 2 (two) times daily as needed (for headaches or pain).   Current Outpatient Medications (Other):  Marland Kitchen  B Complex-C (SUPER B COMPLEX PO), Take 1 tablet by mouth daily.  .  Cholecalciferol (VITAMIN D) 2000 units tablet, Take 2,000 Units by mouth daily.  .  cyclobenzaprine (FLEXERIL) 10 MG tablet, Take 1 tablet (10 mg total) by mouth at bedtime as needed (TMJ/jaw pain). .  diazepam (VALIUM) 2 MG tablet, Take 1/2 to 1 tablet 2 to 3 x / day for Acute Anxiety &  please try to limit to 5 days /week to avoid addiction .  dicyclomine (BENTYL) 20 MG tablet, Take 1 tablet 3 x /day before meals for Abdominal Discomfort, Cramping, Nausea, Bloating or Diarrhea .  latanoprost (XALATAN) 0.005 % ophthalmic solution, Place 1 drop into both eyes at bedtime.  .  Magnesium 250 MG TABS, Take 250 mg by mouth daily. Marland Kitchen  nystatin (MYCOSTATIN) 100000 UNIT/ML suspension, 5 ml four times a day, retain in mouth as long as possible (Swish and Spit).  Use for 48 hours after symptoms resolve. Marland Kitchen  omeprazole (PRILOSEC) 40 MG capsule, Take 1 capsule (40 mg total)  by mouth 2 (two) times daily.   Allergies  Allergen Reactions  . Meloxicam Itching  . Nitrofurantoin Itching and Other (See Comments)    "Flu-like" symptoms, also  . Nitrofurantoin Monohyd Macro Itching and Other (See Comments)    "Flu-like" symptoms, also  . Sulfa Antibiotics Hives and Itching    Current Problems (verified) Patient Active Problem List   Diagnosis Date Noted  . Atherosclerosis of aorta ([place]) 04/14/2018  . Neuropathy 01/09/2015  . Hyperlipidemia 10/29/2014  . Abnormal glucose 10/29/2014  . History of renal cell cancer 10/29/2014  . Labile hypertension 10/29/2014  . Osteoporosis 10/29/2014  . Vitamin D deficiency   . GERD (gastroesophageal reflux disease)   . Anxiety   . Hiatal hernia     Screening Tests Immunization History  Administered Date(s) Administered  . Influenza, High Dose Seasonal PF 06/22/2017, 06/14/2018,  06/19/2019  . Influenza, Seasonal, Injecte, Preservative Fre 07/28/2016  . Pneumococcal Conjugate-13 09/17/2015  . Pneumococcal Polysaccharide-23 10/04/2012  . Td 10/04/2009   Preventative care: Last colonoscopy:01/2016, Dr. Amedeo Plenty EGD 2017 HIDA 06/2017 was decreased Last mammogram: 10/2019 Last pap smear/pelvic exam: 2011  remote DEXA: 10/2019 osteopenia MRI brain 10/2014 Ct AB 53/6144 US carotids LICA <3154 [place] AB 0086 CXR 2017  Prior vaccinations: TD or Tdap: 2011 DUE will get tetanus next OV Influenza: 2020 Pneumococcal: 2014 Prevnar13: 2017 Shingles/Zostavax: declines COVID not interested at this time  Names of Other Physician/Practitioners you currently use: 1. [facility] here for primary care 2. Dr. Katy Fitch, eye doctor, last visit 2021 3. Gwyndolyn Saxon, dentist, Due for 2021  Patient Care Team: Unk Pinto, MD as PCP - General (Internal Medicine) Warden Fillers, MD as Consulting Physician (Optometry) Josue Hector, MD as Consulting Physician (Cardiology) Alexis Frock, MD as Consulting Physician (Urology) Teena Irani, MD (Inactive) as Consulting Physician (Gastroenterology)  SURGICAL HISTORY She  has a past surgical history that includes Nephrectomy (Left, 1999); Left oophorectomy (1985); and Tubal ligation. FAMILY HISTORY Her family history includes Lupus in her granddaughter; Other in her brother; Pneumonia in her father. SOCIAL HISTORY She  reports that she quit smoking about 35 years ago. She has never used smokeless tobacco. She reports current alcohol use. She reports that she does not use drugs. Wine once a week      Review of Systems  Constitutional: Negative for chills, diaphoresis, fever, malaise/fatigue and weight loss.  HENT: Negative for congestion, ear discharge, ear pain, hearing loss, nosebleeds, sinus pain, sore throat and tinnitus.   Eyes: Negative for blurred vision, double vision, photophobia,  pain, discharge and redness.  Respiratory: Negative for cough, hemoptysis, sputum production, shortness of breath, wheezing and stridor.   Cardiovascular: Negative for chest pain, palpitations, orthopnea, claudication, leg swelling and PND.  Gastrointestinal: Positive for abdominal pain. Negative for blood in stool, constipation, diarrhea, heartburn, melena, nausea and vomiting.  Genitourinary: Positive for frequency. Negative for dysuria, flank pain, hematuria and urgency.  Musculoskeletal: Negative for back pain, falls, joint pain, myalgias and neck pain.  Skin: Negative for itching and rash.  Neurological: Positive for sensory change (bilateral feet) and headaches. Negative for dizziness, tingling, tremors, speech change, focal weakness, seizures, loss of consciousness and weakness.  Endo/Heme/Allergies: Negative for environmental allergies and polydipsia. Does not bruise/bleed easily.  Psychiatric/Behavioral: Negative for depression, hallucinations, memory loss, substance abuse and suicidal ideas. The patient is not nervous/anxious and does not have insomnia.      Objective:     Today's Vitals   03/05/20 1031  BP:  124/82  Pulse: 63  Temp: (!) 97.3 F (36.3 C)  SpO2: 96%  Weight: 144 lb 6.4 oz (65.5 kg)  Height: 5\' 1"  (1.549 m)   Body mass index is 27.28 kg/m.  General appearance: alert, no distress, WD/WN, female HEENT: normocephalic, sclerae anicteric, TMs pearly, nares patent, no discharge or erythema, pharynx normal Oral cavity: MMM, white plaque on upper right gum, not scrapable. Neck: supple, no lymphadenopathy, no thyromegaly, no masses Heart: RRR, normal S1, S2, no murmurs Lungs: CTA bilaterally, no wheezes, rhonchi, or rales Abdomen: +bs, soft, diffuse tenderness, non distended, no masses, no hepatomegaly, no splenomegaly Musculoskeletal: nontender, no swelling, no obvious deformity, normal range of motion, spinous process tenderness,, normal sensation, reflexes, and  pulses distal. + TMJ tenderness Extremities: no edema, no cyanosis, no clubbing Pulses: 2+ symmetric, upper and lower extremities, normal cap refill Neurological: alert, oriented x 3, CN2-12 intact, strength normal upper extremities and lower extremities, sensation decreased bilateral feet, DTRs 2+ throughout, no cerebellar signs, gait normal Psychiatric: normal affect, behavior normal, pleasant     [name], NP   03/05/2020

## 2020-03-05 ENCOUNTER — Ambulatory Visit: Payer: Medicare PPO | Admitting: Adult Health Nurse Practitioner

## 2020-03-05 ENCOUNTER — Encounter: Payer: Medicare Other | Admitting: Physician Assistant

## 2020-03-05 ENCOUNTER — Encounter: Payer: Self-pay | Admitting: Adult Health Nurse Practitioner

## 2020-03-05 ENCOUNTER — Other Ambulatory Visit: Payer: Self-pay

## 2020-03-05 VITALS — BP 124/82 | HR 63 | Temp 97.3°F | Ht 61.0 in | Wt 144.4 lb

## 2020-03-05 DIAGNOSIS — K589 Irritable bowel syndrome without diarrhea: Secondary | ICD-10-CM

## 2020-03-05 DIAGNOSIS — R0989 Other specified symptoms and signs involving the circulatory and respiratory systems: Secondary | ICD-10-CM

## 2020-03-05 DIAGNOSIS — Z136 Encounter for screening for cardiovascular disorders: Secondary | ICD-10-CM | POA: Diagnosis not present

## 2020-03-05 DIAGNOSIS — R300 Dysuria: Secondary | ICD-10-CM

## 2020-03-05 DIAGNOSIS — R519 Headache, unspecified: Secondary | ICD-10-CM

## 2020-03-05 DIAGNOSIS — E559 Vitamin D deficiency, unspecified: Secondary | ICD-10-CM

## 2020-03-05 DIAGNOSIS — Z79899 Other long term (current) drug therapy: Secondary | ICD-10-CM

## 2020-03-05 DIAGNOSIS — R7309 Other abnormal glucose: Secondary | ICD-10-CM | POA: Diagnosis not present

## 2020-03-05 DIAGNOSIS — E785 Hyperlipidemia, unspecified: Secondary | ICD-10-CM

## 2020-03-05 DIAGNOSIS — Z0000 Encounter for general adult medical examination without abnormal findings: Secondary | ICD-10-CM

## 2020-03-05 DIAGNOSIS — F419 Anxiety disorder, unspecified: Secondary | ICD-10-CM

## 2020-03-05 DIAGNOSIS — K449 Diaphragmatic hernia without obstruction or gangrene: Secondary | ICD-10-CM

## 2020-03-05 DIAGNOSIS — K2100 Gastro-esophageal reflux disease with esophagitis, without bleeding: Secondary | ICD-10-CM

## 2020-03-05 DIAGNOSIS — K5900 Constipation, unspecified: Secondary | ICD-10-CM

## 2020-03-05 DIAGNOSIS — I700 Atherosclerosis of aorta: Secondary | ICD-10-CM

## 2020-03-05 DIAGNOSIS — G8929 Other chronic pain: Secondary | ICD-10-CM

## 2020-03-05 DIAGNOSIS — M810 Age-related osteoporosis without current pathological fracture: Secondary | ICD-10-CM

## 2020-03-05 DIAGNOSIS — Z6827 Body mass index (BMI) 27.0-27.9, adult: Secondary | ICD-10-CM

## 2020-03-05 NOTE — Patient Instructions (Addendum)
Check at home for dicyclomine (Bentyl) before every meal.  This helps with bloating, cramping and nausea.  Please contact the office if you do not have a prescription at home.  Continue follow up the GI and your upcoming testing.   Use the Flonase daily to help with your nasal symptoms.    Preventive Care for Adults A healthy lifestyle and preventive care can promote health and wellness. Preventive health guidelines for women include the following key practices.  A routine yearly physical is a good way to check with your health care provider about your health and preventive screening. It is a chance to share any concerns and updates on your health and to receive a thorough exam.  Visit your dentist for a routine exam and preventive care every 6 months. Brush your teeth twice a day and floss once a day. Good oral hygiene prevents tooth decay and gum disease.  The frequency of eye exams is based on your age, health, family medical history, use of contact lenses, and other factors. Follow your health care provider's recommendations for frequency of eye exams.  Eat a healthy diet. Foods like vegetables, fruits, whole grains, low-fat dairy products, and lean protein foods contain the nutrients you need without too many calories. Decrease your intake of foods high in solid fats, added sugars, and salt. Eat the right amount of calories for you.Get information about a proper diet from your health care provider, if necessary.  Regular physical exercise is one of the most important things you can do for your health. Most adults should get at least 150 minutes of moderate-intensity exercise (any activity that increases your heart rate and causes you to sweat) each week. In addition, most adults need muscle-strengthening exercises on 2 or more days a week.  Maintain a healthy weight. The body mass index (BMI) is a screening tool to identify possible weight problems. It provides an estimate of body fat  based on height and weight. Your health care provider can find your BMI and can help you achieve or maintain a healthy weight.For adults 20 years and older:  A BMI below 18.5 is considered underweight.  A BMI of 18.5 to 24.9 is normal.  A BMI of 25 to 29.9 is considered overweight.  A BMI of 30 and above is considered obese.  Maintain normal blood lipids and cholesterol levels by exercising and minimizing your intake of saturated fat. Eat a balanced diet with plenty of fruit and vegetables. If your lipid or cholesterol levels are high, you are over 50, or you are at high risk for heart disease, you may need your cholesterol levels checked more frequently.Ongoing high lipid and cholesterol levels should be treated with medicines if diet and exercise are not working.  If you smoke, find out from your health care provider how to quit. If you do not use tobacco, do not start.  Lung cancer screening is recommended for adults aged 74-80 years who are at high risk for developing lung cancer because of a history of smoking. A yearly low-dose CT scan of the lungs is recommended for people who have at least a 30-pack-year history of smoking and are a current smoker or have quit within the past 15 years. A pack year of smoking is smoking an average of 1 pack of cigarettes a day for 1 year (for example: 1 pack a day for 30 years or 2 packs a day for 15 years). Yearly screening should continue until the smoker has stopped  smoking for at least 15 years. Yearly screening should be stopped for people who develop a health problem that would prevent them from having lung cancer treatment.  Avoid use of street drugs. Do not share needles with anyone. Ask for help if you need support or instructions about stopping the use of drugs.  High blood pressure causes heart disease and increases the risk of stroke.  Ongoing high blood pressure should be treated with medicines if weight loss and exercise do not work.  If  you are 61-61 years old, ask your health care provider if you should take aspirin to prevent strokes.  Diabetes screening involves taking a blood sample to check your fasting blood sugar level. This should be done once every 3 years, after age 27, if you are within normal weight and without risk factors for diabetes. Testing should be considered at a younger age or be carried out more frequently if you are overweight and have at least 1 risk factor for diabetes.  Breast cancer screening is essential preventive care for women. You should practice "breast self-awareness." This means understanding the normal appearance and feel of your breasts and may include breast self-examination. Any changes detected, no matter how small, should be reported to a health care provider. Women in their 54s and 30s should have a clinical breast exam (CBE) by a health care provider as part of a regular health exam every 1 to 3 years. After age 58, women should have a CBE every year. Starting at age 72, women should consider having a mammogram (breast X-ray test) every year. Women who have a family history of breast cancer should talk to their health care provider about genetic screening. Women at a high risk of breast cancer should talk to their health care providers about having an MRI and a mammogram every year.  Breast cancer gene (BRCA)-related cancer risk assessment is recommended for women who have family members with BRCA-related cancers. BRCA-related cancers include breast, ovarian, tubal, and peritoneal cancers. Having family members with these cancers may be associated with an increased risk for harmful changes (mutations) in the breast cancer genes BRCA1 and BRCA2. Results of the assessment will determine the need for genetic counseling and BRCA1 and BRCA2 testing.  Routine pelvic exams to screen for cancer are no longer recommended for nonpregnant women who are considered low risk for cancer of the pelvic organs  (ovaries, uterus, and vagina) and who do not have symptoms. Ask your health care provider if a screening pelvic exam is right for you.  If you have had past treatment for cervical cancer or a condition that could lead to cancer, you need Pap tests and screening for cancer for at least 20 years after your treatment. If Pap tests have been discontinued, your risk factors (such as having a new sexual partner) need to be reassessed to determine if screening should be resumed. Some women have medical problems that increase the chance of getting cervical cancer. In these cases, your health care provider may recommend more frequent screening and Pap tests.    Colorectal cancer can be detected and often prevented. Most routine colorectal cancer screening begins at the age of 29 years and continues through age 37 years. However, your health care provider may recommend screening at an earlier age if you have risk factors for colon cancer. On a yearly basis, your health care provider may provide home test kits to check for hidden blood in the stool. Use of a small camera at  the end of a tube, to directly examine the colon (sigmoidoscopy or colonoscopy), can detect the earliest forms of colorectal cancer. Talk to your health care provider about this at age 41, when routine screening begins. Direct exam of the colon should be repeated every 5-10 years through age 28 years, unless early forms of pre-cancerous polyps or small growths are found.  Osteoporosis is a disease in which the bones lose minerals and strength with aging. This can result in serious bone fractures or breaks. The risk of osteoporosis can be identified using a bone density scan. Women ages 43 years and over and women at risk for fractures or osteoporosis should discuss screening with their health care providers. Ask your health care provider whether you should take a calcium supplement or vitamin D to reduce the rate of osteoporosis.  Menopause can  be associated with physical symptoms and risks. Hormone replacement therapy is available to decrease symptoms and risks. You should talk to your health care provider about whether hormone replacement therapy is right for you.  Use sunscreen. Apply sunscreen liberally and repeatedly throughout the day. You should seek shade when your shadow is shorter than you. Protect yourself by wearing long sleeves, pants, a wide-brimmed hat, and sunglasses year round, whenever you are outdoors.  Once a month, do a whole body skin exam, using a mirror to look at the skin on your back. Tell your health care provider of new moles, moles that have irregular borders, moles that are larger than a pencil eraser, or moles that have changed in shape or color.  Stay current with required vaccines (immunizations).  Influenza vaccine. All adults should be immunized every year.  Tetanus, diphtheria, and acellular pertussis (Td, Tdap) vaccine. Pregnant women should receive 1 dose of Tdap vaccine during each pregnancy. The dose should be obtained regardless of the length of time since the last dose. Immunization is preferred during the 27th-36th week of gestation. An adult who has not previously received Tdap or who does not know her vaccine status should receive 1 dose of Tdap. This initial dose should be followed by tetanus and diphtheria toxoids (Td) booster doses every 10 years. Adults with an unknown or incomplete history of completing a 3-dose immunization series with Td-containing vaccines should begin or complete a primary immunization series including a Tdap dose. Adults should receive a Td booster every 10 years.    Zoster vaccine. One dose is recommended for adults aged 69 years or older unless certain conditions are present.    Pneumococcal 13-valent conjugate (PCV13) vaccine. When indicated, a person who is uncertain of her immunization history and has no record of immunization should receive the PCV13 vaccine. An  adult aged 62 years or older who has certain medical conditions and has not been previously immunized should receive 1 dose of PCV13 vaccine. This PCV13 should be followed with a dose of pneumococcal polysaccharide (PPSV23) vaccine. The PPSV23 vaccine dose should be obtained at least 8 weeks after the dose of PCV13 vaccine. An adult aged 85 years or older who has certain medical conditions and previously received 1 or more doses of PPSV23 vaccine should receive 1 dose of PCV13. The PCV13 vaccine dose should be obtained 1 or more years after the last PPSV23 vaccine dose.    Pneumococcal polysaccharide (PPSV23) vaccine. When PCV13 is also indicated, PCV13 should be obtained first. All adults aged 73 years and older should be immunized. An adult younger than age 42 years who has certain medical conditions should  be immunized. Any person who resides in a nursing home or long-term care facility should be immunized. An adult smoker should be immunized. People with an immunocompromised condition and certain other conditions should receive both PCV13 and PPSV23 vaccines. People with human immunodeficiency virus (HIV) infection should be immunized as soon as possible after diagnosis. Immunization during chemotherapy or radiation therapy should be avoided. Routine use of PPSV23 vaccine is not recommended for American Indians, [place]atives, or people younger than 65 years unless there are medical conditions that require PPSV23 vaccine. When indicated, people who have unknown immunization and have no record of immunization should receive PPSV23 vaccine. One-time revaccination 5 years after the first dose of PPSV23 is recommended for people aged 19-64 years who have chronic kidney failure, nephrotic syndrome, asplenia, or immunocompromised conditions. People who received 1-2 doses of PPSV23 before age 47 years should receive another dose of PPSV23 vaccine at age 43 years or later if at least 5 years have passed since the  previous dose. Doses of PPSV23 are not needed for people immunized with PPSV23 at or after age 61 years.   Preventive Services / Frequency  Ages 33 years and over  Blood pressure check.  Lipid and cholesterol check.  Lung cancer screening. / Every year if you are aged 39-80 years and have a 30-pack-year history of smoking and currently smoke or have quit within the past 15 years. Yearly screening is stopped once you have quit smoking for at least 15 years or develop a health problem that would prevent you from having lung cancer treatment.  Clinical breast exam.** / Every year after age 66 years.  BRCA-related cancer risk assessment.** / For women who have family members with a BRCA-related cancer (breast, ovarian, tubal, or peritoneal cancers).  Mammogram.** / Every year beginning at age 52 years and continuing for as long as you are in good health. Consult with your health care provider.  Pap test.** / Every 3 years starting at age 87 years through age 106 or 19 years with 3 consecutive normal Pap tests. Testing can be stopped between 65 and 70 years with 3 consecutive normal Pap tests and no abnormal Pap or HPV tests in the past 10 years.  Fecal occult blood test (FOBT) of stool. / Every year beginning at age 60 years and continuing until age 37 years. You may not need to do this test if you get a colonoscopy every 10 years.  Flexible sigmoidoscopy or colonoscopy.** / Every 5 years for a flexible sigmoidoscopy or every 10 years for a colonoscopy beginning at age 30 years and continuing until age 36 years.  Hepatitis C blood test.** / For all people born from [birth year] through 1966 and any individual with known risks for hepatitis C.  Osteoporosis screening.** / A one-time screening for women ages 66 years and over and women at risk for fractures or osteoporosis.  Skin self-exam. / Monthly.  Influenza vaccine. / Every year.  Tetanus, diphtheria, and acellular pertussis (Tdap/Td)  vaccine.** / 1 dose of Td every 10 years.  Zoster vaccine.** / 1 dose for adults aged 23 years or older.  Pneumococcal 13-valent conjugate (PCV13) vaccine.** / Consult your health care provider.  Pneumococcal polysaccharide (PPSV23) vaccine.** / 1 dose for all adults aged 55 years and older. Screening for abdominal aortic aneurysm (AAA)  by ultrasound is recommended for people who have history of high blood pressure or who are current or former smokers.

## 2020-03-06 LAB — CBC WITH DIFFERENTIAL/PLATELET
Absolute Monocytes: 403 cells/uL (ref 200–950)
Basophils Absolute: 39 cells/uL (ref 0–200)
Basophils Relative: 0.6 %
Eosinophils Absolute: 143 cells/uL (ref 15–500)
Eosinophils Relative: 2.2 %
HCT: 41.8 % (ref 35.0–45.0)
Hemoglobin: 13.2 g/dL (ref 11.7–15.5)
Lymphs Abs: 1931 cells/uL (ref 850–3900)
MCH: 26.9 pg — ABNORMAL LOW (ref 27.0–33.0)
MCHC: 31.6 g/dL — ABNORMAL LOW (ref 32.0–36.0)
MCV: 85.1 fL (ref 80.0–100.0)
MPV: 10.4 fL (ref 7.5–12.5)
Monocytes Relative: 6.2 %
Neutro Abs: 3985 cells/uL (ref 1500–7800)
Neutrophils Relative %: 61.3 %
Platelets: 265 10*3/uL (ref 140–400)
RBC: 4.91 10*6/uL (ref 3.80–5.10)
RDW: 13 % (ref 11.0–15.0)
Total Lymphocyte: 29.7 %
WBC: 6.5 10*3/uL (ref 3.8–10.8)

## 2020-03-06 LAB — COMPLETE METABOLIC PANEL WITH GFR
AG Ratio: 1.6 (calc) (ref 1.0–2.5)
ALT: 6 U/L (ref 6–29)
AST: 20 U/L (ref 10–35)
Albumin: 4.4 g/dL (ref 3.6–5.1)
Alkaline phosphatase (APISO): 68 U/L (ref 37–153)
BUN: 13 mg/dL (ref 7–25)
CO2: 29 mmol/L (ref 20–32)
Calcium: 9.5 mg/dL (ref 8.6–10.4)
Chloride: 103 mmol/L (ref 98–110)
Creat: 0.79 mg/dL (ref 0.60–0.93)
GFR, Est African American: 85 mL/min/{1.73_m2} (ref >=60)
GFR, Est Non African American: 73 mL/min/{1.73_m2} (ref >=60)
Globulin: 2.8 g/dL (calc) (ref 1.9–3.7)
Glucose, Bld: 82 mg/dL (ref 65–99)
Potassium: 4.2 mmol/L (ref 3.5–5.3)
Sodium: 139 mmol/L (ref 135–146)
Total Bilirubin: 0.6 mg/dL (ref 0.2–1.2)
Total Protein: 7.2 g/dL (ref 6.1–8.1)

## 2020-03-06 LAB — URINALYSIS W MICROSCOPIC + REFLEX CULTURE
Bacteria, UA: NONE SEEN /HPF
Bilirubin Urine: NEGATIVE
Glucose, UA: NEGATIVE
Hgb urine dipstick: NEGATIVE
Hyaline Cast: NONE SEEN /LPF
Ketones, ur: NEGATIVE
Leukocyte Esterase: NEGATIVE
Nitrites, Initial: NEGATIVE
Protein, ur: NEGATIVE
RBC / HPF: NONE SEEN /HPF (ref 0–2)
Specific Gravity, Urine: 1.005 (ref 1.001–1.03)
Squamous Epithelial / HPF: NONE SEEN /HPF (ref <=5)
WBC, UA: NONE SEEN /HPF (ref 0–5)
pH: 6 (ref 5.0–8.0)

## 2020-03-06 LAB — LIPID PANEL
Cholesterol: 212 mg/dL — ABNORMAL HIGH (ref <200)
HDL: 80 mg/dL (ref >=50)
LDL Cholesterol (Calc): 111 mg/dL (calc) — ABNORMAL HIGH
Non-HDL Cholesterol (Calc): 132 mg/dL (calc) — ABNORMAL HIGH (ref <130)
Total CHOL/HDL Ratio: 2.7 (calc) (ref <5.0)
Triglycerides: 106 mg/dL (ref <150)

## 2020-03-06 LAB — NO CULTURE INDICATED

## 2020-03-06 LAB — TSH: TSH: 2.7 mIU/L (ref 0.40–4.50)

## 2020-03-06 LAB — HEMOGLOBIN A1C
Hgb A1c MFr Bld: 5.4 % of total Hgb (ref <5.7)
Mean Plasma Glucose: 108 (calc)
eAG (mmol/L): 6 (calc)

## 2020-03-07 ENCOUNTER — Other Ambulatory Visit: Payer: Self-pay

## 2020-03-07 ENCOUNTER — Ambulatory Visit
Admission: RE | Admit: 2020-03-07 | Discharge: 2020-03-07 | Disposition: A | Discharge status: Home / Self Care | Payer: Medicare PPO | Source: Ambulatory Visit | Attending: Physician Assistant | Admitting: Physician Assistant

## 2020-03-07 DIAGNOSIS — R1011 Right upper quadrant pain: Secondary | ICD-10-CM | POA: Diagnosis not present

## 2020-03-11 ENCOUNTER — Ambulatory Visit (HOSPITAL_COMMUNITY)
Admission: RE | Admit: 2020-03-11 | Discharge: 2020-03-11 | Disposition: A | Discharge status: Home / Self Care | Payer: Medicare PPO | Source: Ambulatory Visit | Attending: Physician Assistant | Admitting: Physician Assistant

## 2020-03-11 ENCOUNTER — Other Ambulatory Visit: Payer: Self-pay

## 2020-03-11 DIAGNOSIS — R1011 Right upper quadrant pain: Secondary | ICD-10-CM | POA: Diagnosis not present

## 2020-03-11 DIAGNOSIS — R112 Nausea with vomiting, unspecified: Secondary | ICD-10-CM | POA: Diagnosis not present

## 2020-03-11 MED ORDER — TECHNETIUM TC 99M MEBROFENIN IV KIT: 5.3000 | PACK | Freq: Once | INTRAVENOUS | Status: DC | PRN

## 2020-03-29 DIAGNOSIS — K219 Gastro-esophageal reflux disease without esophagitis: Secondary | ICD-10-CM | POA: Diagnosis not present

## 2020-03-29 DIAGNOSIS — R110 Nausea: Secondary | ICD-10-CM | POA: Diagnosis not present

## 2020-03-29 DIAGNOSIS — K828 Other specified diseases of gallbladder: Secondary | ICD-10-CM | POA: Diagnosis not present

## 2020-03-29 DIAGNOSIS — R1011 Right upper quadrant pain: Secondary | ICD-10-CM | POA: Diagnosis not present

## 2020-03-29 DIAGNOSIS — K5901 Slow transit constipation: Secondary | ICD-10-CM | POA: Diagnosis not present

## 2020-04-02 ENCOUNTER — Other Ambulatory Visit: Payer: Self-pay | Admitting: Adult Health

## 2020-04-02 ENCOUNTER — Ambulatory Visit
Admission: RE | Admit: 2020-04-02 | Discharge: 2020-04-02 | Disposition: A | Discharge status: Home / Self Care | Payer: Medicare PPO | Source: Ambulatory Visit | Attending: Adult Health | Admitting: Adult Health

## 2020-04-02 ENCOUNTER — Ambulatory Visit: Payer: Medicare PPO | Admitting: Adult Health

## 2020-04-02 ENCOUNTER — Other Ambulatory Visit: Payer: Self-pay

## 2020-04-02 ENCOUNTER — Encounter: Payer: Self-pay | Admitting: Adult Health

## 2020-04-02 VITALS — BP 130/76 | HR 62 | Temp 97.7°F | Wt 153.0 lb

## 2020-04-02 DIAGNOSIS — R112 Nausea with vomiting, unspecified: Secondary | ICD-10-CM

## 2020-04-02 DIAGNOSIS — R109 Unspecified abdominal pain: Secondary | ICD-10-CM | POA: Diagnosis not present

## 2020-04-02 DIAGNOSIS — I878 Other specified disorders of veins: Secondary | ICD-10-CM | POA: Diagnosis not present

## 2020-04-02 DIAGNOSIS — R10811 Right upper quadrant abdominal tenderness: Secondary | ICD-10-CM

## 2020-04-02 DIAGNOSIS — R1011 Right upper quadrant pain: Secondary | ICD-10-CM

## 2020-04-02 DIAGNOSIS — R300 Dysuria: Secondary | ICD-10-CM

## 2020-04-02 MED ORDER — ONDANSETRON 8 MG PO TBDP: ORAL_TABLET | ORAL | 1 refills | Status: DC

## 2020-04-02 NOTE — Patient Instructions (Signed)
Xray and likely CT scan at [address] [facility]    Stick to clear fluids overnight, nothing but water in the morning before your CT scan  Please go to the ER if you have any severe AB pain, unable to hold down food/water, blood in stool or vomit, chest pain, shortness of breath, or any worsening symptoms.      Abdominal Pain, Adult Pain in the abdomen (abdominal pain) can be caused by many things. Often, abdominal pain is not serious and it gets better with no treatment or by being treated at home. However, sometimes abdominal pain is serious. Your health care provider will ask questions about your medical history and do a physical exam to try to determine the cause of your abdominal pain. Follow these instructions at home:  Medicines  Take over-the-counter and prescription medicines only as told by your health care provider.  Do not take a laxative unless told by your health care provider. General instructions  Watch your condition for any changes.  Drink enough fluid to keep your urine pale yellow.  Keep all follow-up visits as told by your health care provider. This is important. Contact a health care provider if:  Your abdominal pain changes or gets worse.  You are not hungry or you lose weight without trying.  You are constipated or have diarrhea for more than 2-3 days.  You have pain when you urinate or have a bowel movement.  Your abdominal pain wakes you up at night.  Your pain gets worse with meals, after eating, or with certain foods.  You are vomiting and cannot keep anything down.  You have a fever.  You have blood in your urine. Get help right away if:  Your pain does not go away as soon as your health care provider told you to expect.  You cannot stop vomiting.  Your pain is only in areas of the abdomen, such as the right side or the left lower portion of the abdomen. Pain on the right side could be caused by appendicitis.  You have  bloody or black stools, or stools that look like tar.  You have severe pain, cramping, or bloating in your abdomen.  You have signs of dehydration, such as: ? Dark urine, very little urine, or no urine. ? Cracked lips. ? Dry mouth. ? Sunken eyes. ? Sleepiness. ? Weakness.  You have trouble breathing or chest pain. Summary  Often, abdominal pain is not serious and it gets better with no treatment or by being treated at home. However, sometimes abdominal pain is serious.  Watch your condition for any changes.  Take over-the-counter and prescription medicines only as told by your health care provider.  Contact a health care provider if your abdominal pain changes or gets worse.  Get help right away if you have severe pain, cramping, or bloating in your abdomen. This information is not intended to replace advice given to you by your health care provider. Make sure you discuss any questions you have with your health care provider. Document Revised: 12/19/2018 Document Reviewed: 12/19/2018 [employer] Patient Education  [place].

## 2020-04-02 NOTE — Progress Notes (Signed)
Assessment and Plan:  Kimberly Wilcox was seen today for abdominal pain, flank pain and emesis.  Diagnoses and all orders for this visit:  RUQ abdominal pain Right upper quadrant abdominal tenderness without rebound tenderness Acute worsening of progressive intermittent RUQ pain With recent work up by GI, Kimberly Wilcox was non-diagnosticHIDA showing reduced EF 26%,  Missed surgery appointment this AM, can't see until Sept With exquisite RUQ and epigastric tenderness, nausea with emesis, possible progression to acute cholecystitis or other common bile duct obstruction etiology Recheck upper abdominal labs  Advised clear liquids only today, zofran ODT for nausea Start on omeprazole 40 mg BID as recommended by GI, consider carafate if labs and CT unremarkable Please go to the ER if you have any severe AB pain, unable to hold down food/water, blood in stool or vomit, chest pain, shortness of breath, or any worsening symptoms.  -     CBC with Differential/Platelet -     COMPLETE METABOLIC PANEL WITH GFR -     Amylase -     Lipase -     Urinalysis w microscopic + reflex cultur -     CT ABDOMEN W CONTRAST; Future  Non-intractable vomiting with nausea, unspecified vomiting type Zofran ODT, 8 ng tabs, q8h PRN  Acute right flank pain Hx of L renal cancer s/p resection, did have non-obstructing R stone on last CT Check KUB, renal, UA; consider non-contrast CT if suggestive of obstructing stone -     DG Abd 1 View; Future  Further disposition pending results of labs. Discussed med's effects and SE's.   Over 30 minutes of exam, counseling, chart review, and critical decision making was performed.   Future Appointments  Date Time Provider [place]  06/12/2020  9:30 AM Unk Pinto, MD [facility] None  03/11/2021 10:00 AM McClanahan, Danton Sewer, NP [facility] None    ------------------------------------------------------------------------------------------------------------------   HPI BP 130/76    Pulse 62   Temp 97.7 F (36.5 C)   Wt 153 lb (69.4 kg)   SpO2 96%   BMI 28.91 kg/m   Kimberly Wilcox with hx of L kidney cancer s/p nephrectomy, presents for evaluation of RUQ pain with new flank pain, nausea and vomiting.   She reports intermittent RUQ pain with nausea associated with food intake in the last 6 months. In the last 3-4 days has had significantly worse nausea, with new episodes of emesis (last night only, some improvement after, no blood or black grounds), minimal oral intake, doing broth with rice only, with persistent RUQ pain and new flank/mid back pain, aching and burning sensation with brief stabbing, 2-5/10, waxing and waning, wakes her up at night.   She has tried antiacids, tried different foods without improvement in pain.  She denies diarrhea, constipation, normal stools Denies dysuria, frequency, urgency, did have some cloudiness withotu odor, resolved spontaneously. Denies vaginal discharge.   CT from 07/25/2019 Mild distention of the gallbladder without complicating factors. Status post left nephrectomy. Nonobstructing right renal stone.  She had been referred to [facility] for abdominal pain by GI at [facility], had Kimberly Wilcox and HIDA  Had abd Kimberly Wilcox 03/07/2020 which was essentially normal No gallstones or wall thickening visualized. No sonographic Murphy sign noted by sonographer. Common bile duct Diameter: 4-5 mm in proximal diameter. Previously noted extrahepatic biliary ductal dilation has resolved in the interval. The distal duct is obscured by overlying bowel gas. Liver: No focal lesion identified. Within normal limits in parenchymal echogenicity. Portal vein is patent on color Doppler imaging with normal  direction of blood flow towards the liver.  Had HIDA on 03/11/2020 which showed  Normal hepatobiliary scan, demonstrating patency of cystic and common bile ducts. Decreased gallbladder ejection fraction of 26%.  She was referred to general surgery, was supposed to  have appointment this AM but missed as she was feeling so poorly, tried to reschedule but they cannot see her until September.   Had normal EGD and colonoscopy in 01/2016.  She had normal ETT in 2017, normal cath in 2013.   Past Medical History:  Diagnosis Date  . Anxiety   . Chest discomfort   . GERD (gastroesophageal reflux disease)   . Hiatal hernia   . Hypertension    labile, normal cath 2013  . Osteoporosis   . Renal cell cancer ([place]) 1999   LEFT NEPHRECTOMY  . Vitamin D deficiency      Allergies  Allergen Reactions  . Meloxicam Itching  . Nitrofurantoin Itching and Other (See Comments)    "Flu-like" symptoms, also  . Nitrofurantoin Monohyd Macro Itching and Other (See Comments)    "Flu-like" symptoms, also  . Sulfa Antibiotics Hives and Itching    Current Outpatient Medications on File Prior to Visit  Medication Sig  . acetaminophen (TYLENOL) 325 MG tablet Take 650 mg by mouth every 6 (six) hours as needed.  . B Complex-C (SUPER B COMPLEX PO) Take 1 tablet by mouth daily.   . Cholecalciferol (VITAMIN D) 2000 units tablet Take 2,000 Units by mouth daily.   . cyclobenzaprine (FLEXERIL) 10 MG tablet Take 1 tablet (10 mg total) by mouth at bedtime as needed (TMJ/jaw pain).  . diazepam (VALIUM) 2 MG tablet Take 1/2 to 1 tablet 2 to 3 x / day for Acute Anxiety &  please try to limit to 5 days /week to avoid addiction  . dicyclomine (BENTYL) 20 MG tablet Take 1 tablet 3 x /day before meals for Abdominal Discomfort, Cramping, Nausea, Bloating or Diarrhea  . fluticasone (FLONASE) 50 MCG/ACT nasal spray Place 2 sprays into both nostrils at bedtime.  Marland Kitchen latanoprost (XALATAN) 0.005 % ophthalmic solution Place 1 drop into both eyes at bedtime.   Marland Kitchen losartan-hydrochlorothiazide (HYZAAR) 100-25 MG tablet Take 1 tablet Daily for BP  . Magnesium 250 MG TABS Take 250 mg by mouth daily.  . naproxen sodium (ALEVE) 220 MG tablet Take 220 mg by mouth 2 (two) times daily as needed (for headaches  or pain).  . nitroGLYCERIN (NITROSTAT) 0.4 MG SL tablet Place 1 tablet (0.4 mg total) under the tongue every 5 (five) minutes as needed for chest pain.  Marland Kitchen nystatin (MYCOSTATIN) 100000 UNIT/ML suspension 5 ml four times a day, retain in mouth as long as possible (Swish and Spit).  Use for 48 hours after symptoms resolve.  Marland Kitchen omeprazole (PRILOSEC) 40 MG capsule Take 1 capsule (40 mg total) by mouth 2 (two) times daily.  . ondansetron (ZOFRAN ODT) 8 MG disintegrating tablet 8mg  ODT q4 hours prn nausea  . PROAIR HFA 108 (90 Base) MCG/ACT inhaler INHALE 2 PUFFS INTO THE LUNGS EVERY 4 HOURS AS NEEDED FOR WHEEZING OR SHORTNESS OF BREATH   No current facility-administered medications on file prior to visit.    ROS: all negative except above.   Physical Exam:  BP 130/76   Pulse 62   Temp 97.7 F (36.5 C)   Wt 153 lb (69.4 kg)   SpO2 96%   BMI 28.91 kg/m   General Appearance: Well nourished, in no acute distress, non toxic but appears  to be feeling unwell Eyes: PERRLA, EOMs, conjunctiva no swelling or erythema Sinuses: No Frontal/maxillary tenderness ENT/Mouth: Ext aud canals clear, TMs without erythema, bulging. No erythema, swelling, or exudate on post pharynx.  Tonsils not swollen or erythematous. Hearing normal.  Neck: Supple Respiratory: Respiratory effort normal, BS equal bilaterally without rales, rhonchi, wheezing or stridor.  Cardio: RRR with no MRGs. Brisk peripheral pulses without edema.  Abdomen: Soft, mildly distended + BS.  Very tender epigastric and RUQ with guarding, + Murphy's, without rebound, hernias, masses. Does have + CVA tenderness.  Lymphatics: Non tender without lymphadenopathy.  Musculoskeletal: normal gait.  Skin: Warm, dry without rashes, lesions, ecchymosis.  Neuro: Normal muscle tone Psych: Awake and oriented X 3, normal affect, Insight and Judgment appropriate.     Izora Ribas, NP 3:57 PM [facility]

## 2020-04-03 ENCOUNTER — Ambulatory Visit
Admission: RE | Admit: 2020-04-03 | Discharge: 2020-04-03 | Disposition: A | Discharge status: Home / Self Care | Payer: Medicare PPO | Source: Ambulatory Visit | Attending: Adult Health | Admitting: Adult Health

## 2020-04-03 DIAGNOSIS — R1011 Right upper quadrant pain: Secondary | ICD-10-CM

## 2020-04-03 DIAGNOSIS — R10811 Right upper quadrant abdominal tenderness: Secondary | ICD-10-CM

## 2020-04-03 DIAGNOSIS — K7689 Other specified diseases of liver: Secondary | ICD-10-CM | POA: Diagnosis not present

## 2020-04-03 DIAGNOSIS — K6389 Other specified diseases of intestine: Secondary | ICD-10-CM | POA: Diagnosis not present

## 2020-04-03 DIAGNOSIS — R112 Nausea with vomiting, unspecified: Secondary | ICD-10-CM

## 2020-04-03 DIAGNOSIS — I700 Atherosclerosis of aorta: Secondary | ICD-10-CM | POA: Diagnosis not present

## 2020-04-03 DIAGNOSIS — N200 Calculus of kidney: Secondary | ICD-10-CM | POA: Diagnosis not present

## 2020-04-03 LAB — COMPLETE METABOLIC PANEL WITH GFR
AG Ratio: 1.4 (calc) (ref 1.0–2.5)
ALT: 7 U/L (ref 6–29)
AST: 21 U/L (ref 10–35)
Albumin: 4.3 g/dL (ref 3.6–5.1)
Alkaline phosphatase (APISO): 66 U/L (ref 37–153)
BUN: 11 mg/dL (ref 7–25)
CO2: 28 mmol/L (ref 20–32)
Calcium: 9.5 mg/dL (ref 8.6–10.4)
Chloride: 103 mmol/L (ref 98–110)
Creat: 0.83 mg/dL (ref 0.60–0.93)
GFR, Est African American: 80 mL/min/{1.73_m2} (ref >=60)
GFR, Est Non African American: 69 mL/min/{1.73_m2} (ref >=60)
Globulin: 3.1 g/dL (calc) (ref 1.9–3.7)
Glucose, Bld: 78 mg/dL (ref 65–99)
Potassium: 4 mmol/L (ref 3.5–5.3)
Sodium: 139 mmol/L (ref 135–146)
Total Bilirubin: 0.5 mg/dL (ref 0.2–1.2)
Total Protein: 7.4 g/dL (ref 6.1–8.1)

## 2020-04-03 LAB — CBC WITH DIFFERENTIAL/PLATELET
Absolute Monocytes: 462 {cells}/uL (ref 200–950)
Basophils Absolute: 42 {cells}/uL (ref 0–200)
Basophils Relative: 0.6 %
Eosinophils Absolute: 147 {cells}/uL (ref 15–500)
Eosinophils Relative: 2.1 %
HCT: 40.2 % (ref 35.0–45.0)
Hemoglobin: 13.1 g/dL (ref 11.7–15.5)
Lymphs Abs: 2289 {cells}/uL (ref 850–3900)
MCH: 27.2 pg (ref 27.0–33.0)
MCHC: 32.6 g/dL (ref 32.0–36.0)
MCV: 83.6 fL (ref 80.0–100.0)
MPV: 10.1 fL (ref 7.5–12.5)
Monocytes Relative: 6.6 %
Neutro Abs: 4060 {cells}/uL (ref 1500–7800)
Neutrophils Relative %: 58 %
Platelets: 294 Thousand/uL (ref 140–400)
RBC: 4.81 Million/uL (ref 3.80–5.10)
RDW: 12.6 % (ref 11.0–15.0)
Total Lymphocyte: 32.7 %
WBC: 7 Thousand/uL (ref 3.8–10.8)

## 2020-04-03 LAB — URINALYSIS W MICROSCOPIC + REFLEX CULTURE
Bacteria, UA: NONE SEEN /HPF
Bilirubin Urine: NEGATIVE
Glucose, UA: NEGATIVE
Hgb urine dipstick: NEGATIVE
Hyaline Cast: NONE SEEN /LPF
Ketones, ur: NEGATIVE
Leukocyte Esterase: NEGATIVE
Nitrites, Initial: NEGATIVE
Protein, ur: NEGATIVE
RBC / HPF: NONE SEEN /HPF (ref 0–2)
Specific Gravity, Urine: 1.004 (ref 1.001–1.03)
Squamous Epithelial / HPF: NONE SEEN /HPF (ref <=5)
WBC, UA: NONE SEEN /HPF (ref 0–5)
pH: 7 (ref 5.0–8.0)

## 2020-04-03 LAB — NO CULTURE INDICATED

## 2020-04-03 LAB — AMYLASE: Amylase: 44 U/L (ref 21–101)

## 2020-04-03 LAB — LIPASE: Lipase: 22 U/L (ref 7–60)

## 2020-04-03 MED ORDER — IOPAMIDOL (ISOVUE-300) INJECTION 61%
100.0000 mL | Freq: Once | INTRAVENOUS | Status: AC | PRN
  Administered 2020-04-03: 100 mL via INTRAVENOUS

## 2020-04-26 ENCOUNTER — Ambulatory Visit: Payer: Self-pay | Admitting: Surgery

## 2020-04-26 DIAGNOSIS — R6881 Early satiety: Secondary | ICD-10-CM | POA: Diagnosis not present

## 2020-04-26 DIAGNOSIS — K811 Chronic cholecystitis: Secondary | ICD-10-CM | POA: Diagnosis not present

## 2020-04-26 DIAGNOSIS — K581 Irritable bowel syndrome with constipation: Secondary | ICD-10-CM | POA: Diagnosis not present

## 2020-04-26 DIAGNOSIS — R140 Abdominal distension (gaseous): Secondary | ICD-10-CM | POA: Diagnosis not present

## 2020-05-01 ENCOUNTER — Other Ambulatory Visit (HOSPITAL_COMMUNITY): Payer: Self-pay | Admitting: Surgery

## 2020-05-01 ENCOUNTER — Other Ambulatory Visit: Payer: Self-pay | Admitting: Surgery

## 2020-05-01 DIAGNOSIS — R140 Abdominal distension (gaseous): Secondary | ICD-10-CM

## 2020-05-13 ENCOUNTER — Ambulatory Visit: Payer: Self-pay | Admitting: Surgery

## 2020-05-13 ENCOUNTER — Ambulatory Visit (HOSPITAL_COMMUNITY)
Admission: RE | Admit: 2020-05-13 | Discharge: 2020-05-13 | Disposition: A | Discharge status: Home / Self Care | Payer: Medicare PPO | Source: Ambulatory Visit | Attending: Surgery | Admitting: Surgery

## 2020-05-13 ENCOUNTER — Other Ambulatory Visit: Payer: Self-pay

## 2020-05-13 DIAGNOSIS — R1110 Vomiting, unspecified: Secondary | ICD-10-CM | POA: Diagnosis not present

## 2020-05-13 DIAGNOSIS — R140 Abdominal distension (gaseous): Secondary | ICD-10-CM | POA: Diagnosis not present

## 2020-05-13 DIAGNOSIS — R1011 Right upper quadrant pain: Secondary | ICD-10-CM | POA: Diagnosis not present

## 2020-05-13 MED ORDER — TECHNETIUM TC 99M SULFUR COLLOID
2.0000 | Freq: Once | INTRAVENOUS | Status: AC | PRN
  Administered 2020-05-13: 2 via INTRAVENOUS

## 2020-05-20 DIAGNOSIS — Z01818 Encounter for other preprocedural examination: Secondary | ICD-10-CM | POA: Diagnosis not present

## 2020-05-20 DIAGNOSIS — Z1159 Encounter for screening for other viral diseases: Secondary | ICD-10-CM | POA: Diagnosis not present

## 2020-05-24 ENCOUNTER — Other Ambulatory Visit: Payer: Self-pay | Admitting: Surgery

## 2020-05-24 DIAGNOSIS — K811 Chronic cholecystitis: Secondary | ICD-10-CM | POA: Diagnosis not present

## 2020-06-12 ENCOUNTER — Encounter: Payer: Self-pay | Admitting: Internal Medicine

## 2020-06-12 ENCOUNTER — Ambulatory Visit: Payer: Medicare PPO | Admitting: Internal Medicine

## 2020-06-12 ENCOUNTER — Other Ambulatory Visit: Payer: Self-pay

## 2020-06-12 VITALS — BP 120/84 | HR 83 | Temp 97.2°F | Resp 16 | Ht 61.0 in | Wt 139.8 lb

## 2020-06-12 DIAGNOSIS — I700 Atherosclerosis of aorta: Secondary | ICD-10-CM

## 2020-06-12 DIAGNOSIS — E559 Vitamin D deficiency, unspecified: Secondary | ICD-10-CM | POA: Diagnosis not present

## 2020-06-12 DIAGNOSIS — Z23 Encounter for immunization: Secondary | ICD-10-CM

## 2020-06-12 DIAGNOSIS — I10 Essential (primary) hypertension: Secondary | ICD-10-CM | POA: Diagnosis not present

## 2020-06-12 DIAGNOSIS — Z79899 Other long term (current) drug therapy: Secondary | ICD-10-CM | POA: Diagnosis not present

## 2020-06-12 DIAGNOSIS — R7309 Other abnormal glucose: Secondary | ICD-10-CM | POA: Diagnosis not present

## 2020-06-12 DIAGNOSIS — E782 Mixed hyperlipidemia: Secondary | ICD-10-CM | POA: Diagnosis not present

## 2020-06-12 MED ORDER — DIAZEPAM 2 MG PO TABS: ORAL_TABLET | ORAL | 0 refills | Status: AC

## 2020-06-12 NOTE — Progress Notes (Signed)
History of Present Illness:       This very nice 74 y.o. WWF presents for 3 month follow up with HTN, HLD, Pre-Diabetes and Vitamin D Deficiency.  Abd CT scan in 2019 showed Aortic Atherosclerosis.  Patient's GERD is controlled on her meds.       Patient is treated for HTN & BP has been controlled at home. Today's BP is at goal - 120/84.  Patient had a Normal Heart Cath in 2013. Patient has had no complaints of any cardiac type chest pain, palpitations, dyspnea / orthopnea / PND, dizziness, claudication, or dependent edema.  BP Readings from Last 3 Encounters:  04/02/20 130/76  03/05/20 124/82  11/03/19 120/80        Hyperlipidemia is not controlled with diet . Last Lipids were not at goal:  Lab Results  Component Value Date   CHOL 212 (H) 03/05/2020   HDL 80 03/05/2020   LDLCALC 111 (H) 03/05/2020   TRIG 106 03/05/2020   CHOLHDL 2.7 03/05/2020    Also, the patient has history of PreDiabetes and has had no symptoms of reactive hypoglycemia, diabetic polys, paresthesias or visual blurring.  Last A1c was Normal & at goal:  Lab Results  Component Value Date   HGBA1C 5.4 03/05/2020       Further, the patient also has history of Vitamin D Deficiency and supplements vitamin D without any suspected side-effects. Last vitamin D was still very low:   Lab Results  Component Value Date   VD25OH 34 08/01/2019    Current Outpatient Medications on File Prior to Visit  Medication Sig  . acetaminophen  325 MG tablet Take 650 mg  every 6 (six) hours as needed.  . SUPER B COMPLEX  Take 1 tablet daily.   Marland Kitchen VITAMIN D 2000 units tablet Take 2,000 Unitsdaily.   . cyclobenzaprine  10 MG tablet Take 1 tablet at bedtime as needed   . diazepam 2 MG tablet Take 1/2 to 1 tablet 2 to 3 x / day  . dicyclomine 20 MG tablet Take 1 tablet 3 x /day before meals   . FLONASE  nasal spray Place 2 sprays into both nostrils at bedtime.  Marland Kitchen XALATAN 0.005 % ophth soln Place 1 drop into both eyes at  bedtime.   Marland Kitchen losartan-hctz 100-25 MG tablet Take 1 tablet Daily for BP  . Magnesium 250 MG TABS Take 250 mg by mouth daily.  . naproxen sodium  220 MG tablet Take  2  times daily as needed   . NITROSTAT 0.4 MG SL t as needed for chest pain.  Marland Kitchen omeprazole  40 MG capsule Take 1 capsule  2 times daily.  . ondansetron  ODT) 8 MG  8mg  ODT q4 hours prn nausea  . PROAIR HFA inhaler INHALE 2 PUFFS  EVERY 4 HRS AS NEEDED      Allergies  Allergen Reactions  . Meloxicam Itching  . Nitrofurantoin Itching and Other (See Comments)    "Flu-like" symptoms, also  . Nitrofurantoin Monohyd Macro Itching and Other (See Comments)    "Flu-like" symptoms, also  . Sulfa Antibiotics Hives and Itching    PMHx:   Past Medical History:  Diagnosis Date  . Anxiety   . Chest discomfort   . GERD (gastroesophageal reflux disease)   . Hiatal hernia   . Hypertension    labile, normal cath 2013  . Osteoporosis   . Renal cell cancer ([place]) 1999   LEFT NEPHRECTOMY  .  Vitamin D deficiency     Immunization History  Administered Date(s) Administered  . Influenza, High Dose Seasonal PF 06/22/2017, 06/14/2018, 06/19/2019  . Influenza, Seasonal, Injecte, Preservative Fre 07/28/2016  . Pneumococcal Conjugate-13 09/17/2015  . Pneumococcal Polysaccharide-23 10/04/2012  . Td 10/04/2009    Past Surgical History:  Procedure Laterality Date  . LEFT OOPHORECTOMY  1985   Benign tumor  . NEPHRECTOMY Left 1999   secondary to cancer  . TUBAL LIGATION      FHx:    Reviewed / unchanged  SHx:    Reviewed / unchanged   Systems Review:  Constitutional: Denies fever, chills, wt changes, headaches, insomnia, fatigue, night sweats, change in appetite. Eyes: Denies redness, blurred vision, diplopia, discharge, itchy, watery eyes.  ENT: Denies discharge, congestion, post nasal drip, epistaxis, sore throat, earache, hearing loss, dental pain, tinnitus, vertigo, sinus pain, snoring.  CV: Denies chest pain, palpitations,  irregular heartbeat, syncope, dyspnea, diaphoresis, orthopnea, PND, claudication or edema. Respiratory: denies cough, dyspnea, DOE, pleurisy, hoarseness, laryngitis, wheezing.  Gastrointestinal: Denies dysphagia, odynophagia, heartburn, reflux, water brash, abdominal pain or cramps, nausea, vomiting, bloating, diarrhea, constipation, hematemesis, melena, hematochezia  or hemorrhoids. Genitourinary: Denies dysuria, frequency, urgency, nocturia, hesitancy, discharge, hematuria or flank pain. Musculoskeletal: Denies arthralgias, myalgias, stiffness, jt. swelling, pain, limping or strain/sprain.  Skin: Denies pruritus, rash, hives, warts, acne, eczema or change in skin lesion(s). Neuro: No weakness, tremor, incoordination, spasms, paresthesia or pain. Psychiatric: Denies confusion, memory loss or sensory loss. Endo: Denies change in weight, skin or hair change.  Heme/Lymph: No excessive bleeding, bruising or enlarged lymph nodes.  Physical Exam  BP 120/84   Pulse 83   Temp (!) 97.2 F (36.2 C)   Resp 16   Ht 5\' 1"  (1.549 m)   Wt 139 lb 12.8 oz (63.4 kg)   BMI 26.41 kg/m   Appears  well nourished, well groomed  and in no distress.  Eyes: PERRLA, EOMs, conjunctiva no swelling or erythema. Sinuses: No frontal/maxillary tenderness ENT/Mouth: EAC's clear, TM's nl w/o erythema, bulging. Nares clear w/o erythema, swelling, exudates. Oropharynx clear without erythema or exudates. Oral hygiene is good. Tongue normal, non obstructing. Hearing intact.  Neck: Supple. Thyroid not palpable. Car 2+/2+ without bruits, nodes or JVD. Chest: Respirations nl with BS clear & equal w/o rales, rhonchi, wheezing or stridor.  Cor: Heart sounds normal w/ regular rate and rhythm without sig. murmurs, gallops, clicks or rubs. Peripheral pulses normal and equal  without edema.  Abdomen: Soft & bowel sounds normal. Non-tender w/o guarding, rebound, hernias, masses or organomegaly.  Lymphatics: Unremarkable.   Musculoskeletal: Full ROM all peripheral extremities, joint stability, 5/5 strength and normal gait.  Skin: Warm, dry without exposed rashes, lesions or ecchymosis apparent.  Neuro: Cranial nerves intact, reflexes equal bilaterally. Sensory-motor testing grossly intact. Tendon reflexes grossly intact.  Pysch: Alert & oriented x 3.  Insight and judgement nl & appropriate. No ideations.  Assessment and Plan:  1. Essential hypertension  - Continue medication, monitor blood pressure at home.  - Continue DASH diet.  Reminder to go to the ER if any CP,  SOB, nausea, dizziness, severe HA, changes vision/speech.  - CBC with Differential/Platelet - COMPLETE METABOLIC PANEL WITH GFR - Magnesium - TSH  2. Hyperlipidemia, mixed  - Continue diet/meds, exercise,& lifestyle modifications.  - Continue monitor periodic cholesterol/liver & renal functions   - Lipid panel - TSH  3. Abnormal glucose  - Continue diet, exercise  - Lifestyle modifications.  - Monitor appropriate labs.  -  Hemoglobin A1c - Insulin, random  4. Vitamin D deficiency  - Continue supplementation.  - VITAMIN D 25 Hydroxy   5. Atherosclerosis of aorta (HCC)  - Lipid panel  6. Medication management  - CBC with Differential/Platelet - COMPLETE METABOLIC PANEL WITH GFR - Magnesium - Lipid panel - TSH - Hemoglobin A1c - Insulin, random - VITAMIN D 25 Hydroxy         Discussed  regular exercise, BP monitoring, weight control to achieve/maintain BMI less than 25 and discussed med and SE's. Recommended labs to assess and monitor clinical status with further disposition pending results of labs.  I discussed the assessment and treatment plan with the patient. The patient was provided an opportunity to ask questions and all were answered. The patient agreed with the plan and demonstrated an understanding of the instructions.  I provided over 30 minutes of exam, counseling, chart review and  complex critical decision  making.         The patient was advised to call back or seek an in-person evaluation if the symptoms worsen or if the condition fails to improve as anticipated.   Kirtland Bouchard, MD

## 2020-06-12 NOTE — Patient Instructions (Signed)

## 2020-06-13 LAB — COMPLETE METABOLIC PANEL WITH GFR
AG Ratio: 1.7 (calc) (ref 1.0–2.5)
ALT: 7 U/L (ref 6–29)
AST: 20 U/L (ref 10–35)
Albumin: 4.3 g/dL (ref 3.6–5.1)
Alkaline phosphatase (APISO): 72 U/L (ref 37–153)
BUN: 11 mg/dL (ref 7–25)
CO2: 27 mmol/L (ref 20–32)
Calcium: 9.4 mg/dL (ref 8.6–10.4)
Chloride: 104 mmol/L (ref 98–110)
Creat: 0.73 mg/dL (ref 0.60–0.93)
GFR, Est African American: 93 mL/min/{1.73_m2} (ref >=60)
GFR, Est Non African American: 81 mL/min/{1.73_m2} (ref >=60)
Globulin: 2.6 g/dL (calc) (ref 1.9–3.7)
Glucose, Bld: 88 mg/dL (ref 65–99)
Potassium: 4.1 mmol/L (ref 3.5–5.3)
Sodium: 139 mmol/L (ref 135–146)
Total Bilirubin: 0.6 mg/dL (ref 0.2–1.2)
Total Protein: 6.9 g/dL (ref 6.1–8.1)

## 2020-06-13 LAB — CBC WITH DIFFERENTIAL/PLATELET
Absolute Monocytes: 317 cells/uL (ref 200–950)
Basophils Absolute: 32 cells/uL (ref 0–200)
Basophils Relative: 0.7 %
Eosinophils Absolute: 92 cells/uL (ref 15–500)
Eosinophils Relative: 2 %
HCT: 39.9 % (ref 35.0–45.0)
Hemoglobin: 12.7 g/dL (ref 11.7–15.5)
Lymphs Abs: 1647 cells/uL (ref 850–3900)
MCH: 27 pg (ref 27.0–33.0)
MCHC: 31.8 g/dL — ABNORMAL LOW (ref 32.0–36.0)
MCV: 84.7 fL (ref 80.0–100.0)
MPV: 10.1 fL (ref 7.5–12.5)
Monocytes Relative: 6.9 %
Neutro Abs: 2512 cells/uL (ref 1500–7800)
Neutrophils Relative %: 54.6 %
Platelets: 271 10*3/uL (ref 140–400)
RBC: 4.71 10*6/uL (ref 3.80–5.10)
RDW: 12.9 % (ref 11.0–15.0)
Total Lymphocyte: 35.8 %
WBC: 4.6 10*3/uL (ref 3.8–10.8)

## 2020-06-13 LAB — HEMOGLOBIN A1C
Hgb A1c MFr Bld: 5.6 % of total Hgb (ref <5.7)
Mean Plasma Glucose: 114 (calc)
eAG (mmol/L): 6.3 (calc)

## 2020-06-13 LAB — LIPID PANEL
Cholesterol: 194 mg/dL (ref <200)
HDL: 73 mg/dL (ref >=50)
LDL Cholesterol (Calc): 101 mg/dL (calc) — ABNORMAL HIGH
Non-HDL Cholesterol (Calc): 121 mg/dL (calc) (ref <130)
Total CHOL/HDL Ratio: 2.7 (calc) (ref <5.0)
Triglycerides: 105 mg/dL (ref <150)

## 2020-06-13 LAB — MAGNESIUM: Magnesium: 1.9 mg/dL (ref 1.5–2.5)

## 2020-06-13 LAB — VITAMIN D 25 HYDROXY (VIT D DEFICIENCY, FRACTURES): Vit D, 25-Hydroxy: 33 ng/mL (ref 30–100)

## 2020-06-13 LAB — INSULIN, RANDOM: Insulin: 5.1 u[IU]/mL

## 2020-06-13 LAB — TSH: TSH: 2.17 mIU/L (ref 0.40–4.50)

## 2020-06-13 NOTE — Progress Notes (Signed)
========================================================== -   Test results slightly outside the reference range are not unusual. If there is anything important, I will review this with you,  otherwise it is considered normal test values.  If you have further questions,  please do not hesitate to contact me at the office or via My Chart.  ========================================================== ==========================================================  -   Total Chol 194 and LDL 101 - Great  ==========================================================  -  A1c - Normal - Great - No Diabetes ==========================================================  -  Vitamin D = 33 - Very, very Low   $$$$$$$$$$$$$$$$$$$$$$$$$$$$$$$$$$$$$$$$$$$$$$$$$$$$$$$$$$$$$  - Please INCREASE your Vitamin D up to 6,000 units /day   $$$$$$$$$$$$$$$$$$$$$$$$$$$$$$$$$$$$$$$$$$$$$$$$$$$$$$$$$$$$$  - It is very important as a natural anti-inflammatory and helping the  immune system protect against viral infections, like the Covid-19    helping hair, skin, and nails, as well as reducing stroke and  heart attack risk.   - It helps your bones and helps with mood.  - It also decreases numerous cancer risks so please  take it as directed.   - Low Vit D is associated with a 200-300% higher risk for  CANCER   and 200-300% higher risk for HEART   ATTACK  &  STROKE.    - It is also associated with higher death rate at younger ages,   autoimmune diseases like Rheumatoid arthritis, Lupus,  Multiple Sclerosis.     - Also many other serious conditions, like depression, Alzheimer's  Dementia, infertility, muscle aches, fatigue, fibromyalgia   - just to name a few. ==========================================================  - All Else - CBC - Kidneys - Electrolytes - Liver - Magnesium & Thyroid    - all  Normal / OK ==========================================================

## 2020-07-01 NOTE — Progress Notes (Signed)
CARDIOLOGY CONSULT NOTE       Patient ID: Kimberly Wilcox MRN: [id number] DOB/AGE: Sep 17, 1945 74 y.o.  Admit date: (Not on file) Referring Physician: Melford Aase Primary Physician: Unk Pinto, MD Primary Cardiologist: New Reason for Consultation: History of chest pain CRF;s   Active Problems:   * No active hospital problems. *   HPI:  74 y.o. HLD, HTN DM-2 .  Anxiety worse with COVID travel restrictions. History of GERD and GI issues IBS and dysphagia. Atypical chest pains Normal cath in 2013 Normal ETT 2017.  Post left nephrectomy for renal cell carcinoma with normal baseline Cr 0.73 06/12/20   Having atypical SSCP sharp non exertional at times 2x/week No associated dyspnea, palpitations or syncope   Does not want COVID vaccine has had flu shot Discussed at length Her daughter who works at [employer] had [place] twice  ROS All other systems reviewed and negative except as noted above  Past Medical History:  Diagnosis Date  . Anxiety   . Chest discomfort   . GERD (gastroesophageal reflux disease)   . Hiatal hernia   . Hypertension    labile, normal cath 2013  . Osteoporosis   . Renal cell cancer ([place]) 1999   LEFT NEPHRECTOMY  . Vitamin D deficiency     Family History  Problem Relation Age of Onset  . Pneumonia Father   . Other Brother        Brain disease  . Lupus Granddaughter   . Stomach cancer Neg Hx   . Colon cancer Neg Hx   . Pancreatic cancer Neg Hx   . Esophageal cancer Neg Hx     Social History   Socioeconomic History  . Marital status: Widowed    Spouse name: Not on file  . Number of children: 4  . Years of education: Not on file  . Highest education level: Not on file  Occupational History  . Occupation: Insurance claims handler  Tobacco Use  . Smoking status: Former Smoker    Quit date: 10/28/1984    Years since quitting: 35.7  . Smokeless tobacco: Never Used  Vaping Use  . Vaping Use: Never used  Substance and Sexual Activity  . Alcohol use:  Yes    Alcohol/week: 0.0 standard drinks    Comment: wine 2-3 glasses once a month  . Drug use: No  . Sexual activity: Not Currently    Comment: 1st intercourse 13 yo-5 partners  Other Topics Concern  . Not on file  Social History Narrative  . Not on file   Social Determinants of Health   Financial Resource Strain:   . Difficulty of Paying Living Expenses: Not on file  Food Insecurity:   . Worried About Charity fundraiser in the Last Year: Not on file  . Ran Out of Food in the Last Year: Not on file  Transportation Needs:   . Lack of Transportation (Medical): Not on file  . Lack of Transportation (Non-Medical): Not on file  Physical Activity:   . Days of Exercise per Week: Not on file  . Minutes of Exercise per Session: Not on file  Stress:   . Feeling of Stress : Not on file  Social Connections:   . Frequency of Communication with Friends and Family: Not on file  . Frequency of Social Gatherings with Friends and Family: Not on file  . Attends Religious Services: Not on file  . Active Member of Clubs or Organizations: Not on file  . Attends Club  or Organization Meetings: Not on file  . Marital Status: Not on file  Intimate Partner Violence:   . Fear of Current or Ex-Partner: Not on file  . Emotionally Abused: Not on file  . Physically Abused: Not on file  . Sexually Abused: Not on file    Past Surgical History:  Procedure Laterality Date  . LEFT OOPHORECTOMY  1985   Benign tumor  . NEPHRECTOMY Left 1999   secondary to cancer  . TUBAL LIGATION        Physical Exam:   BP 128/74   Pulse 91   Ht 5\' 1"  (1.549 m)   Wt 63.8 kg   SpO2 97%   BMI 26.57 kg/m  Affect appropriate Healthy:  appears stated age 57: normal Neck supple with no adenopathy JVP normal no bruits no thyromegaly Lungs clear with no wheezing and good diaphragmatic motion Heart:  S1/S2 no murmur, no rub, gallop or click PMI normal Abdomen: benighn, post nephrectomy  Distal pulses intact  with no bruits No edema Neuro non-focal Skin warm and dry No muscular weakness   Labs:   Lab Results  Component Value Date   WBC 4.6 06/12/2020   HGB 12.7 06/12/2020   HCT 39.9 06/12/2020   MCV 84.7 06/12/2020   PLT 271 06/12/2020   No results for input(s): NA, K, CL, CO2, BUN, CREATININE, CALCIUM, PROT, BILITOT, ALKPHOS, ALT, AST, GLUCOSE in the last 168 hours.  Invalid input(s): LABALBU Lab Results  Component Value Date   CKTOTAL 56 10/29/2014   TROPONINI <0.01 06/14/2018    Lab Results  Component Value Date   CHOL 194 06/12/2020   CHOL 212 (H) 03/05/2020   CHOL 215 (H) 08/01/2019   Lab Results  Component Value Date   HDL 73 06/12/2020   HDL 80 03/05/2020   HDL 78 08/01/2019   Lab Results  Component Value Date   LDLCALC 101 (H) 06/12/2020   LDLCALC 111 (H) 03/05/2020   LDLCALC 117 (H) 08/01/2019   Lab Results  Component Value Date   TRIG 105 06/12/2020   TRIG 106 03/05/2020   TRIG 96 08/01/2019   Lab Results  Component Value Date   CHOLHDL 2.7 06/12/2020   CHOLHDL 2.7 03/05/2020   CHOLHDL 2.8 08/01/2019   No results found for: LDLDIRECT    Radiology: No results found.  EKG: 10/25/18 SR rate 71 normal ECG    ASSESSMENT AND PLAN:   1. HTN: Well controlled.  Continue current medications and low sodium Dash type diet.   2. HLD:  LDL 101  not on statin f/u primary  3. DM:  Discussed low carb diet.  Target hemoglobin A1c is 6.5 or less.  Continue current medications. 4. Chest Pain:  History of with normal ETT 2017 and normal cath in 2013 Atypical sharp pains f/u ETT 5. Anxiety:  Worse with COVID travel restrictions PRN valium   Signed: Jenkins Rouge 07/02/2020, 11:14 AM

## 2020-07-02 ENCOUNTER — Ambulatory Visit: Payer: Medicare PPO | Admitting: Cardiovascular Disease

## 2020-07-02 ENCOUNTER — Other Ambulatory Visit: Payer: Self-pay

## 2020-07-02 ENCOUNTER — Encounter: Payer: Self-pay | Admitting: Cardiovascular Disease

## 2020-07-02 VITALS — BP 128/74 | HR 91 | Ht 61.0 in | Wt 140.6 lb

## 2020-07-02 DIAGNOSIS — R079 Chest pain, unspecified: Secondary | ICD-10-CM

## 2020-07-02 NOTE — Patient Instructions (Addendum)
Medication Instructions:   *If you need a refill on your cardiac medications before your next appointment, please call your pharmacy*  Lab Work: If you have labs (blood work) drawn today and your tests are completely normal, you will receive your results only by: Marland Kitchen MyChart Message (if you have MyChart) OR . A paper copy in the mail If you have any lab test that is abnormal or we need to change your treatment, we will call you to review the results.  Testing/Procedures: Your physician has requested that you have an exercise tolerance test. For further information please visit [email or web address]. Please also follow instruction sheet, as given.  Follow-Up: At [facility], you and your health needs are our priority.  As part of our continuing mission to provide you with exceptional heart care, we have created designated Provider Care Teams.  These Care Teams include your primary Cardiologist (physician) and Advanced Practice Providers (APPs -  Physician Assistants and Nurse Practitioners) who all work together to provide you with the care you need, when you need it.  We recommend signing up for the patient portal called "MyChart".  Sign up information is provided on this After Visit Summary.  MyChart is used to connect with patients for Virtual Visits (Telemedicine).  Patients are able to view lab/test results, encounter notes, upcoming appointments, etc.  Non-urgent messages can be sent to your provider as well.   To learn more about what you can do with MyChart, go to [email or web address].    Your next appointment:   12 month(s)  The format for your next appointment:   In Person  Provider:   You may see Dr. Johnsie Cancel or one of the following Advanced Practice Providers on your designated Care Team:    Truitt Merle, NP  Cecilie Kicks, NP  Kathyrn Drown, NP

## 2020-07-12 ENCOUNTER — Encounter: Payer: Self-pay | Admitting: Adult Health

## 2020-07-12 ENCOUNTER — Ambulatory Visit: Payer: Medicare PPO | Admitting: Adult Health

## 2020-07-12 ENCOUNTER — Other Ambulatory Visit: Payer: Self-pay

## 2020-07-12 ENCOUNTER — Ambulatory Visit: Payer: Medicare PPO

## 2020-07-12 VITALS — BP 120/72 | HR 73 | Temp 97.5°F | Wt 141.8 lb

## 2020-07-12 DIAGNOSIS — H6121 Impacted cerumen, right ear: Secondary | ICD-10-CM | POA: Diagnosis not present

## 2020-07-12 DIAGNOSIS — H9202 Otalgia, left ear: Secondary | ICD-10-CM | POA: Diagnosis not present

## 2020-07-12 DIAGNOSIS — H9193 Unspecified hearing loss, bilateral: Secondary | ICD-10-CM

## 2020-07-12 DIAGNOSIS — H938X1 Other specified disorders of right ear: Secondary | ICD-10-CM

## 2020-07-12 DIAGNOSIS — H9313 Tinnitus, bilateral: Secondary | ICD-10-CM

## 2020-07-12 DIAGNOSIS — H6983 Other specified disorders of Eustachian tube, bilateral: Secondary | ICD-10-CM

## 2020-07-12 MED ORDER — PREDNISONE 20 MG PO TABS: ORAL_TABLET | ORAL | 0 refills | Status: DC

## 2020-07-12 MED ORDER — FEXOFENADINE HCL 180 MG PO TABS: 180.0000 mg | ORAL_TABLET | Freq: Every day | ORAL | 2 refills | Status: DC

## 2020-07-12 NOTE — Patient Instructions (Signed)
Eustachian Tube Dysfunction ° °Eustachian tube dysfunction refers to a condition in which a blockage develops in the narrow passage that connects the middle ear to the back of the nose (eustachian tube). The eustachian tube regulates air pressure in the middle ear by letting air move between the ear and nose. It also helps to drain fluid from the middle ear space. °Eustachian tube dysfunction can affect one or both ears. When the eustachian tube does not function properly, air pressure, fluid, or both can build up in the middle ear. °What are the causes? °This condition occurs when the eustachian tube becomes blocked or cannot open normally. Common causes of this condition include: °· Ear infections. °· Colds and other infections that affect the nose, mouth, and throat (upper respiratory tract). °· Allergies. °· Irritation from cigarette smoke. °· Irritation from stomach acid coming up into the esophagus (gastroesophageal reflux). The esophagus is the tube that carries food from the mouth to the stomach. °· Sudden changes in air pressure, such as from descending in an airplane or scuba diving. °· Abnormal growths in the nose or throat, such as: °? Growths that line the nose (nasal polyps). °? Abnormal growth of cells (tumors). °? Enlarged tissue at the back of the throat (adenoids). °What increases the risk? °You are more likely to develop this condition if: °· You smoke. °· You are overweight. °· You are a child who has: °? Certain birth defects of the mouth, such as cleft palate. °? Large tonsils or adenoids. °What are the signs or symptoms? °Common symptoms of this condition include: °· A feeling of fullness in the ear. °· Ear pain. °· Clicking or popping noises in the ear. °· Ringing in the ear. °· Hearing loss. °· Loss of balance. °· Dizziness. °Symptoms may get worse when the air pressure around you changes, such as when you travel to an area of high elevation, fly on an airplane, or go scuba diving. °How is  this diagnosed? °This condition may be diagnosed based on: °· Your symptoms. °· A physical exam of your ears, nose, and throat. °· Tests, such as those that measure: °? The movement of your eardrum (tympanogram). °? Your hearing (audiometry). °How is this treated? °Treatment depends on the cause and severity of your condition. °· In mild cases, you may relieve your symptoms by moving air into your ears. This is called "popping the ears." °· In more severe cases, or if you have symptoms of fluid in your ears, treatment may include: °? Medicines to relieve congestion (decongestants). °? Medicines that treat allergies (antihistamines). °? Nasal sprays or ear drops that contain medicines that reduce swelling (steroids). °? A procedure to drain the fluid in your eardrum (myringotomy). In this procedure, a small tube is placed in the eardrum to: °§ Drain the fluid. °§ Restore the air in the middle ear space. °? A procedure to insert a balloon device through the nose to inflate the opening of the eustachian tube (balloon dilation). °Follow these instructions at home: °Lifestyle °· Do not do any of the following until your health care provider approves: °? Travel to high altitudes. °? Fly in airplanes. °? Work in a pressurized cabin or room. °? Scuba dive. °· Do not use any products that contain nicotine or tobacco, such as cigarettes and e-cigarettes. If you need help quitting, ask your health care provider. °· Keep your ears dry. Wear fitted earplugs during showering and bathing. Dry your ears completely after. °General instructions °· Take over-the-counter   and prescription medicines only as told by your health care provider. °· Use techniques to help pop your ears as recommended by your health care provider. These may include: °? Chewing gum. °? Yawning. °? Frequent, forceful swallowing. °? Closing your mouth, holding your nose closed, and gently blowing as if you are trying to blow air out of your nose. °· Keep all  follow-up visits as told by your health care provider. This is important. °Contact a health care provider if: °· Your symptoms do not go away after treatment. °· Your symptoms come back after treatment. °· You are unable to pop your ears. °· You have: °? A fever. °? Pain in your ear. °? Pain in your head or neck. °? Fluid draining from your ear. °· Your hearing suddenly changes. °· You become very dizzy. °· You lose your balance. °Summary °· Eustachian tube dysfunction refers to a condition in which a blockage develops in the eustachian tube. °· It can be caused by ear infections, allergies, inhaled irritants, or abnormal growths in the nose or throat. °· Symptoms include ear pain, hearing loss, or ringing in the ears. °· Mild cases are treated with maneuvers to unblock the ears, such as yawning or ear popping. °· Severe cases are treated with medicines. Surgery may also be done (rare). °This information is not intended to replace advice given to you by your health care provider. Make sure you discuss any questions you have with your health care provider. °Document Revised: 11/30/2017 Document Reviewed: 11/30/2017 °Elsevier Patient Education © 2020 [employer] ° °

## 2020-07-12 NOTE — Progress Notes (Signed)
Assessment and Plan:  Kimberly Wilcox was seen today for acute visit.  Diagnoses and all orders for this visit:  Left ear pain Ear pressure, right Dysfunction of both eustachian tubes Allergic rhinitis - Allegra OTC, increase H20, allergy hygiene explained. Exam suggests likely eustachian tube dysfunction- no infection- suggest flonase/nasonex, allergy pills, and hold nose while drinking water to autoinflate, oral steroids offered, if drainage from ear, fever, chills, HA, nausea, call the office or go to the ER -     predniSONE (DELTASONE) 20 MG tablet; 2 tablets daily for 3 days, 1 tablet daily for 4 days. -     fexofenadine (ALLEGRA) 180 MG tablet; Take 1 tablet (180 mg total) by mouth daily.  Decreased hearing of both ears Not improved with cerumen removal, proceed with ENT evaluation for possible hearing aids -     Ambulatory referral to ENT  Tinnitus of both ears -     Ambulatory referral to ENT  Impacted cerumen of right ear - stop using Qtips, irrigation used in the office without complications, use OTC drops/oil at home to prevent reoccurence  Further disposition pending results of labs. Discussed med's effects and SE's.   Over 15 minutes of exam, counseling, chart review, and critical decision making was performed.   Future Appointments  Date Time Provider [place]  07/26/2020 11:30 AM [facility] None  07/30/2020 11:30 AM [facility]  10/10/2020  9:30 AM McClanahan, Kyra, NP [facility] None  03/11/2021 10:00 AM McClanahan, Kyra, NP [facility] None    ------------------------------------------------------------------------------------------------------------------   HPI BP 120/72   Pulse 73   Temp (!) 97.5 F (36.4 C)   Wt 141 lb 12.8 oz (64.3 kg)   SpO2 96%   BMI 26.79 kg/m   74 y.o.female presents for evaluation of bil ears, reports hearing has been decreased for the past year, then yesterday was having aching in L  ear, did irrigation of ear but didn't help, R also with sense of pressure/fullness. Tried irrigating due to repviously being advised of wax without benefit.   She reports 2 years bil ringing/buzzing in bil ears.   She reports year round allergies, congestion, takes flonase daily. Admits was told to take allergy pill but not doing so regularly. Denies sore throat, ear discharge, fever/chills, sinus sx, HA, dizziness.   Past Medical History:  Diagnosis Date  . Anxiety   . Chest discomfort   . GERD (gastroesophageal reflux disease)   . Hiatal hernia   . Hypertension    labile, normal cath 2013  . Osteoporosis   . Renal cell cancer ([place]) 1999   LEFT NEPHRECTOMY  . Vitamin D deficiency      Allergies  Allergen Reactions  . Meloxicam Itching  . Nitrofurantoin Itching and Other (See Comments)    "Flu-like" symptoms, also  . Nitrofurantoin Monohyd Macro Itching and Other (See Comments)    "Flu-like" symptoms, also  . Sulfa Antibiotics Hives and Itching    Current Outpatient Medications on File Prior to Visit  Medication Sig  . acetaminophen (TYLENOL) 325 MG tablet Take 650 mg by mouth every 6 (six) hours as needed.  . B Complex-C (SUPER B COMPLEX PO) Take 1 tablet by mouth daily.   . Cholecalciferol (VITAMIN D) 2000 units tablet Take 2,000 Units by mouth daily.   . cyclobenzaprine (FLEXERIL) 10 MG tablet Take 1 tablet (10 mg total) by mouth at bedtime as needed (TMJ/jaw pain).  . diazepam (VALIUM) 2 MG tablet Take  1/2 to 1 tablet     2 to 3 x /day     for Acute Anxiety &  please try to limit to 5 days /week to avoid Addiction  . dicyclomine (BENTYL) 20 MG tablet Take 1 tablet 3 x /day before meals for Abdominal Discomfort, Cramping, Nausea, Bloating or Diarrhea  . fluticasone (FLONASE) 50 MCG/ACT nasal spray Place 2 sprays into both nostrils at bedtime.  Marland Kitchen latanoprost (XALATAN) 0.005 % ophthalmic solution Place 1 drop into both eyes at bedtime.   . Magnesium 250 MG TABS Take 250  mg by mouth daily.  . naproxen sodium (ALEVE) 220 MG tablet Take 220 mg by mouth 2 (two) times daily as needed (for headaches or pain).  . nitroGLYCERIN (NITROSTAT) 0.4 MG SL tablet Place 1 tablet (0.4 mg total) under the tongue every 5 (five) minutes as needed for chest pain.  Marland Kitchen omeprazole (PRILOSEC) 40 MG capsule Take 1 capsule (40 mg total) by mouth 2 (two) times daily.  . ondansetron (ZOFRAN ODT) 8 MG disintegrating tablet 8mg  ODT q4 hours prn nausea  . PROAIR HFA 108 (90 Base) MCG/ACT inhaler INHALE 2 PUFFS INTO THE LUNGS EVERY 4 HOURS AS NEEDED FOR WHEEZING OR SHORTNESS OF BREATH   No current facility-administered medications on file prior to visit.    ROS: all negative except above.   Physical Exam:  BP 120/72   Pulse 73   Temp (!) 97.5 F (36.4 C)   Wt 141 lb 12.8 oz (64.3 kg)   SpO2 96%   BMI 26.79 kg/m   General Appearance: Well nourished, in no apparent distress. Eyes: PERRLA, EOMs, conjunctiva no swelling or erythema Sinuses: No Frontal/maxillary tenderness ENT/Mouth: L Ext aud canals clear, R with moderate dry wax obstructing, clear/non-inflamed after irrigation, TMs without erythema, bulging. No erythema, swelling, or exudate on post pharynx.  Tonsils not swollen or erythematous. Mildly HOH.  Neck: Supple, thyroid normal. Tenderness to upper anterior neck below angle of jaw without palpable abnormality.  Respiratory: Respiratory effort normal, BS equal bilaterally without rales, rhonchi, wheezing or stridor.  Cardio: RRR with no MRGs. Brisk peripheral pulses without edema. No carotid bruits.  Abdomen: Soft, + BS.  Non tender, no guarding, rebound, hernias, masses. Lymphatics: Non tender without lymphadenopathy.  Musculoskeletal: normal gait.  Skin: Warm, dry without rashes, lesions, ecchymosis.  Neuro: Normal muscle tone Psych: Awake and oriented X 3, normal affect, Insight and Judgment appropriate.     Izora Ribas, NP 12:43 PM [facility]  Internal Medicine

## 2020-07-26 ENCOUNTER — Other Ambulatory Visit (HOSPITAL_COMMUNITY): Payer: Medicare PPO

## 2020-08-01 DIAGNOSIS — H40053 Ocular hypertension, bilateral: Secondary | ICD-10-CM | POA: Diagnosis not present

## 2020-08-05 ENCOUNTER — Ambulatory Visit (INDEPENDENT_AMBULATORY_CARE_PROVIDER_SITE_OTHER): Payer: Medicare PPO | Admitting: Otolaryngology

## 2020-08-09 ENCOUNTER — Other Ambulatory Visit: Payer: Self-pay

## 2020-08-09 ENCOUNTER — Encounter (INDEPENDENT_AMBULATORY_CARE_PROVIDER_SITE_OTHER): Payer: Self-pay | Admitting: Otolaryngology

## 2020-08-09 ENCOUNTER — Ambulatory Visit (INDEPENDENT_AMBULATORY_CARE_PROVIDER_SITE_OTHER): Payer: Medicare PPO | Admitting: Otolaryngology

## 2020-08-09 DIAGNOSIS — J310 Chronic rhinitis: Secondary | ICD-10-CM

## 2020-08-09 DIAGNOSIS — H903 Sensorineural hearing loss, bilateral: Secondary | ICD-10-CM | POA: Diagnosis not present

## 2020-08-09 DIAGNOSIS — H9313 Tinnitus, bilateral: Secondary | ICD-10-CM

## 2020-08-09 NOTE — Progress Notes (Signed)
HPI: Kimberly Wilcox is a 74 y.o. female who presents is referred by her PCP for evaluation of ear complaints and "sinus" complaints.  She has noticed some decreased hearing as well as some ringing in her ears.  She has been treated with a course of steroids as well as Flonase and takes over-the-counter antihistamine medication.  Her nose is doing well today but she complains of intermittent postnasal drainage.  She also complains of tinnitus in her ears..  Past Medical History:  Diagnosis Date  . Anxiety   . Chest discomfort   . GERD (gastroesophageal reflux disease)   . Hiatal hernia   . Hypertension    labile, normal cath 2013  . Osteoporosis   . Renal cell cancer ([place]) 1999   LEFT NEPHRECTOMY  . Vitamin D deficiency    Past Surgical History:  Procedure Laterality Date  . LEFT OOPHORECTOMY  1985   Benign tumor  . NEPHRECTOMY Left 1999   secondary to cancer  . TUBAL LIGATION     Social History   Socioeconomic History  . Marital status: Widowed    Spouse name: Not on file  . Number of children: 4  . Years of education: Not on file  . Highest education level: Not on file  Occupational History  . Occupation: Insurance claims handler  Tobacco Use  . Smoking status: Former Smoker    Quit date: 10/28/1984    Years since quitting: 35.8  . Smokeless tobacco: Never Used  Vaping Use  . Vaping Use: Never used  Substance and Sexual Activity  . Alcohol use: Yes    Alcohol/week: 0.0 standard drinks    Comment: wine 2-3 glasses once a month  . Drug use: No  . Sexual activity: Not Currently    Comment: 1st intercourse 13 yo-5 partners  Other Topics Concern  . Not on file  Social History Narrative  . Not on file   Social Determinants of Health   Financial Resource Strain: Not on file  Food Insecurity: Not on file  Transportation Needs: Not on file  Physical Activity: Not on file  Stress: Not on file  Social Connections: Not on file   Family History  Problem Relation  Age of Onset  . Pneumonia Father   . Other Brother        Brain disease  . Lupus Granddaughter   . Stomach cancer Neg Hx   . Colon cancer Neg Hx   . Pancreatic cancer Neg Hx   . Esophageal cancer Neg Hx    Allergies  Allergen Reactions  . Meloxicam Itching  . Nitrofurantoin Itching and Other (See Comments)    "Flu-like" symptoms, also  . Nitrofurantoin Monohyd Macro Itching and Other (See Comments)    "Flu-like" symptoms, also  . Sulfa Antibiotics Hives and Itching   Prior to Admission medications   Medication Sig Start Date End Date Taking? Authorizing Provider  acetaminophen (TYLENOL) 325 MG tablet Take 650 mg by mouth every 6 (six) hours as needed.    [provider]  B Complex-C (SUPER B COMPLEX PO) Take 1 tablet by mouth daily.     [provider]  Cholecalciferol (VITAMIN D) 2000 units tablet Take 2,000 Units by mouth daily.     [provider]  cyclobenzaprine (FLEXERIL) 10 MG tablet Take 1 tablet (10 mg total) by mouth at bedtime as needed (TMJ/jaw pain). 11/03/19   Vladimir Crofts, PA-C  diazepam (VALIUM) 2 MG tablet Take     1/2  to 1 tablet     2 to 3 x /day     for Acute Anxiety &  please try to limit to 5 days /week to avoid Addiction 06/12/20   Unk Pinto, MD  dicyclomine (BENTYL) 20 MG tablet Take 1 tablet 3 x /day before meals for Abdominal Discomfort, Cramping, Nausea, Bloating or Diarrhea 09/25/19   Unk Pinto, MD  fexofenadine (ALLEGRA) 180 MG tablet Take 1 tablet (180 mg total) by mouth daily. 07/12/20   Liane Comber, NP  fluticasone (FLONASE) 50 MCG/ACT nasal spray Place 2 sprays into both nostrils at bedtime. 08/01/19   McClanahan, Danton Sewer, NP  latanoprost (XALATAN) 0.005 % ophthalmic solution Place 1 drop into both eyes at bedtime.  11/07/14   [provider]  Magnesium 250 MG TABS Take 250 mg by mouth daily.    [provider]  naproxen sodium (ALEVE) 220 MG tablet Take 220 mg by mouth 2 (two) times daily as  needed (for headaches or pain).    [provider]  nitroGLYCERIN (NITROSTAT) 0.4 MG SL tablet Place 1 tablet (0.4 mg total) under the tongue every 5 (five) minutes as needed for chest pain. 06/14/18 11/21/24  Liane Comber, NP  omeprazole (PRILOSEC) 40 MG capsule Take 1 capsule (40 mg total) by mouth 2 (two) times daily. 08/16/19   Levin Erp, PA  ondansetron (ZOFRAN ODT) 8 MG disintegrating tablet 8mg  ODT q4 hours prn nausea 04/02/20   Liane Comber, NP  predniSONE (DELTASONE) 20 MG tablet 2 tablets daily for 3 days, 1 tablet daily for 4 days. 07/12/20   Liane Comber, NP  PROAIR HFA 108 (90 Base) MCG/ACT inhaler INHALE 2 PUFFS INTO THE LUNGS EVERY 4 HOURS AS NEEDED FOR WHEEZING OR SHORTNESS OF BREATH 11/23/18   Vicie Mutters R, PA-C     Positive ROS: Otherwise negative  All other systems have been reviewed and were otherwise negative with the exception of those mentioned in the HPI and as above.  Physical Exam: Constitutional: Alert, well-appearing, no acute distress Ears: External ears without lesions or tenderness. Ear canals are clear bilaterally.  TMs are clear bilaterally. Nasal: External nose without lesions. Septum midline with mild rhinitis.  Both middle meatus regions are clear with no edema and no evidence of infection.. Clear nasal passages otherwise. Oral: Lips and gums without lesions. Tongue and palate mucosa without lesions. Posterior oropharynx clear. Neck: No palpable adenopathy or masses Respiratory: Breathing comfortably  Skin: No facial/neck lesions or rash noted.  Audiologic testing demonstrated a moderate severe downsloping sensorineural hearing loss in both ears which was symmetric above 2000 frequency.  She had type A tympanograms bilaterally.  Procedures  Assessment: Chronic rhinitis. Bilateral high-frequency sensorineural hearing loss in both ears consistent with presbycusis Secondary tinnitus.  Plan: Would recommend use of hearing  aids for hearing.  She will investigate this and plan on obtaining them on her own. She will follow up here as needed She does well with the Flonase and antihistamine for her nasal sinus issues.  Also discussed with her concerning use of saline rinses.   Radene Journey, MD   CC:

## 2020-08-29 ENCOUNTER — Ambulatory Visit: Payer: Medicare PPO | Admitting: Adult Health

## 2020-08-29 ENCOUNTER — Encounter (INDEPENDENT_AMBULATORY_CARE_PROVIDER_SITE_OTHER): Payer: Self-pay

## 2020-08-29 ENCOUNTER — Other Ambulatory Visit: Payer: Self-pay

## 2020-08-29 ENCOUNTER — Encounter: Payer: Self-pay | Admitting: Adult Health

## 2020-08-29 VITALS — BP 122/74 | HR 73 | Temp 96.3°F | Wt 142.0 lb

## 2020-08-29 DIAGNOSIS — R300 Dysuria: Secondary | ICD-10-CM

## 2020-08-29 DIAGNOSIS — R058 Other specified cough: Secondary | ICD-10-CM

## 2020-08-29 MED ORDER — PROMETHAZINE-DM 6.25-15 MG/5ML PO SYRP: 5.0000 mL | ORAL_SOLUTION | Freq: Four times a day (QID) | ORAL | 1 refills | Status: DC | PRN

## 2020-08-29 MED ORDER — CEPHALEXIN 500 MG PO CAPS: 500.0000 mg | ORAL_CAPSULE | Freq: Two times a day (BID) | ORAL | 0 refills | Status: DC

## 2020-08-29 MED ORDER — PREDNISONE 20 MG PO TABS: ORAL_TABLET | ORAL | 0 refills | Status: DC

## 2020-08-29 MED ORDER — FEXOFENADINE HCL 180 MG PO TABS: 180.0000 mg | ORAL_TABLET | Freq: Every day | ORAL | 2 refills | Status: AC

## 2020-08-29 NOTE — Progress Notes (Signed)
Assessment and Plan:  Kimberly Wilcox was seen today for acute visit.  Diagnoses and all orders for this visit:  Dysuria Presumptive UTI tx due to classic sx and hx Medications: Keflex. Maintain adequate hydration. Follow up if symptoms not improving, and as needed. May change abx if indicated by culture -     cephALEXin (KEFLEX) 500 MG capsule; Take 1 capsule (500 mg total) by mouth 2 (two) times daily. -     Urinalysis, Reflex Microscopic -     Culture, Urine  Post-viral cough syndrome Post-viral dry cough, all other sx resolved, benign exam, mild costochondritis as well; discussed and sent in short steroid taper, cough medication  Restart on allergy pill, voice rest, suppress cough with OTC sugar free candy and RX med.   Follow up if not resolving, will do CXR -     predniSONE (DELTASONE) 20 MG tablet; 2 tablets daily for 3 days, 1 tablet daily for 4 days. -     promethazine-dextromethorphan (PROMETHAZINE-DM) 6.25-15 MG/5ML syrup; Take 5 mLs by mouth 4 (four) times daily as needed for cough.  Other orders -     fexofenadine (ALLEGRA) 180 MG tablet; Take 1 tablet (180 mg total) by mouth daily.   Further disposition pending results of labs. Discussed med's effects and SE's.   Over 30 minutes of exam, counseling, chart review, and critical decision making was performed.   Future Appointments  Date Time Provider [place]  10/10/2020  9:30 AM [name], NP [facility] None  03/11/2021 10:00 AM McClanahan, Danton Sewer, NP [facility] None    ------------------------------------------------------------------------------------------------------------------   HPI BP 122/74   Pulse 73   Temp (!) 96.3 F (35.7 C)   Wt 142 lb (64.4 kg)   SpO2 96%   BMI 26.83 kg/m   74 y.o.female presents for evaluation of UTI like sx, and persistent dry cough following covid 19 case 3 weeks ago. Reports hacking dry cough, chest hurts when coughing. Non productive, no dyspnea, fever/chills  (improved/reslved), describes spasm/tickle in chest then hacking episodes. Delsym not helpful.   Also reports dysuria x 2 days, frequency, urgency; denies hematuria, fever/chills, flank pain. Does have hx of UTI, last culture was 07/2019, klebsiella, was prescribed cipro after tx failure with augmentin.   Past Medical History:  Diagnosis Date  . Anxiety   . Chest discomfort   . GERD (gastroesophageal reflux disease)   . Hiatal hernia   . Hypertension    labile, normal cath 2013  . Osteoporosis   . Renal cell cancer ([place]) 1999   LEFT NEPHRECTOMY  . Vitamin D deficiency      Allergies  Allergen Reactions  . Meloxicam Itching  . Nitrofurantoin Itching and Other (See Comments)    "Flu-like" symptoms, also  . Nitrofurantoin Monohyd Macro Itching and Other (See Comments)    "Flu-like" symptoms, also  . Sulfa Antibiotics Hives and Itching    Current Outpatient Medications on File Prior to Visit  Medication Sig  . acetaminophen (TYLENOL) 325 MG tablet Take 650 mg by mouth every 6 (six) hours as needed.  . B Complex-C (SUPER B COMPLEX PO) Take 1 tablet by mouth daily.   . Cholecalciferol (VITAMIN D) 2000 units tablet Take 2,000 Units by mouth daily.   . cyclobenzaprine (FLEXERIL) 10 MG tablet Take 1 tablet (10 mg total) by mouth at bedtime as needed (TMJ/jaw pain).  . diazepam (VALIUM) 2 MG tablet Take     1/2 to 1 tablet     2 to 3 x /day  for Acute Anxiety &  please try to limit to 5 days /week to avoid Addiction  . dicyclomine (BENTYL) 20 MG tablet Take 1 tablet 3 x /day before meals for Abdominal Discomfort, Cramping, Nausea, Bloating or Diarrhea  . fluticasone (FLONASE) 50 MCG/ACT nasal spray Place 2 sprays into both nostrils at bedtime.  Marland Kitchen latanoprost (XALATAN) 0.005 % ophthalmic solution Place 1 drop into both eyes at bedtime.   . Magnesium 250 MG TABS Take 250 mg by mouth daily.  . naproxen sodium (ALEVE) 220 MG tablet Take 220 mg by mouth 2 (two) times daily as needed (for  headaches or pain).  . nitroGLYCERIN (NITROSTAT) 0.4 MG SL tablet Place 1 tablet (0.4 mg total) under the tongue every 5 (five) minutes as needed for chest pain.  Marland Kitchen omeprazole (PRILOSEC) 40 MG capsule Take 1 capsule (40 mg total) by mouth 2 (two) times daily.  . ondansetron (ZOFRAN ODT) 8 MG disintegrating tablet 8mg  ODT q4 hours prn nausea  . PROAIR HFA 108 (90 Base) MCG/ACT inhaler INHALE 2 PUFFS INTO THE LUNGS EVERY 4 HOURS AS NEEDED FOR WHEEZING OR SHORTNESS OF BREATH   No current facility-administered medications on file prior to visit.    ROS: all negative except above.   Physical Exam:  BP 122/74   Pulse 73   Temp (!) 96.3 F (35.7 C)   Wt 142 lb (64.4 kg)   SpO2 96%   BMI 26.83 kg/m   General Appearance: Well nourished, in no apparent distress. Eyes: PERRLA, EOMs, conjunctiva no swelling or erythema Sinuses: No Frontal/maxillary tenderness ENT/Mouth: Ext aud canals clear, TMs without erythema, bulging. No erythema, swelling, or exudate on post pharynx.  Tonsils not swollen or erythematous. Hearing normal.  Neck: Supple Respiratory: Respiratory effort normal, BS equal bilaterally without rales, rhonchi, wheezing or stridor.  Cardio: RRR with no MRGs. Brisk peripheral pulses without edema.  Abdomen: Soft, + BS.  Non tender, no guarding, rebound, hernias, masses. No CVA tenderness.  Lymphatics: Non tender without lymphadenopathy.  Musculoskeletal: Normal gait. Mild anterior parasternal chest wall tenderness.  Skin: Warm, dry without rashes, lesions, ecchymosis.  Neuro:  Normal muscle tone Psych: Awake and oriented X 3, normal affect, Insight and Judgment appropriate.     , NP 5:26 PM [facility]

## 2020-08-31 ENCOUNTER — Other Ambulatory Visit: Payer: Self-pay | Admitting: Adult Health

## 2020-08-31 LAB — URINE CULTURE
MICRO NUMBER:: 11390582
SPECIMEN QUALITY:: ADEQUATE

## 2020-08-31 LAB — URINALYSIS, ROUTINE W REFLEX MICROSCOPIC
Bilirubin Urine: NEGATIVE
Glucose, UA: NEGATIVE
Hgb urine dipstick: NEGATIVE
Hyaline Cast: NONE SEEN /LPF
Ketones, ur: NEGATIVE
Nitrite: POSITIVE — AB
Protein, ur: NEGATIVE
RBC / HPF: NONE SEEN /HPF (ref 0–2)
Specific Gravity, Urine: 1.009 (ref 1.001–1.03)
pH: 7 (ref 5.0–8.0)

## 2020-08-31 MED ORDER — CIPROFLOXACIN HCL 500 MG PO TABS: 500.0000 mg | ORAL_TABLET | Freq: Two times a day (BID) | ORAL | 0 refills | Status: AC

## 2020-09-02 ENCOUNTER — Telehealth: Payer: Self-pay | Admitting: Cardiovascular Disease

## 2020-09-02 NOTE — Telephone Encounter (Signed)
Patient was schedule for 07-30-20 to do GXT, she called and cancel test.   Spoke with patient today and she don't want to do the test.   Order will be cancel.

## 2020-09-11 ENCOUNTER — Other Ambulatory Visit: Payer: Self-pay | Admitting: Adult Health

## 2020-09-11 ENCOUNTER — Telehealth: Payer: Self-pay

## 2020-09-11 MED ORDER — CIPROFLOXACIN HCL 500 MG PO TABS: 500.0000 mg | ORAL_TABLET | Freq: Two times a day (BID) | ORAL | 0 refills | Status: AC

## 2020-09-11 NOTE — Telephone Encounter (Signed)
Pt informed of rx sent

## 2020-09-11 NOTE — Telephone Encounter (Signed)
-----   Message from Liane Comber, NP sent at 09/11/2020  1:49 PM EST ----- Regarding: RE: MEDS/UTI Contact: [phone] Reviewed note and culture results - sent in Cipro 500 mg BID x 5 days to her [employer] on file. If this isn't improving sx within 48 hours, recommend OV to recheck Urine and consider vaginal infection or other explanation for her sx. Thanks!  ----- Message ----- From: Elenor Quinones, CMA Sent: 09/11/2020  11:08 AM EST To: Liane Comber, NP Subject: MEDS/UTI                                       MED that was given for UTI are not working. Would like a NEW rx for UTI.  [facility]:pharmacy

## 2020-10-10 ENCOUNTER — Ambulatory Visit: Payer: Medicare PPO | Admitting: Adult Health Nurse Practitioner

## 2020-10-10 ENCOUNTER — Other Ambulatory Visit: Payer: Self-pay

## 2020-10-10 ENCOUNTER — Encounter: Payer: Self-pay | Admitting: Adult Health Nurse Practitioner

## 2020-10-10 VITALS — BP 136/80 | HR 73 | Temp 98.1°F | Ht 61.0 in | Wt 143.4 lb

## 2020-10-10 DIAGNOSIS — Z0001 Encounter for general adult medical examination with abnormal findings: Secondary | ICD-10-CM

## 2020-10-10 DIAGNOSIS — E559 Vitamin D deficiency, unspecified: Secondary | ICD-10-CM

## 2020-10-10 DIAGNOSIS — J00 Acute nasopharyngitis [common cold]: Secondary | ICD-10-CM

## 2020-10-10 DIAGNOSIS — K2100 Gastro-esophageal reflux disease with esophagitis, without bleeding: Secondary | ICD-10-CM | POA: Diagnosis not present

## 2020-10-10 DIAGNOSIS — R519 Headache, unspecified: Secondary | ICD-10-CM | POA: Diagnosis not present

## 2020-10-10 DIAGNOSIS — R300 Dysuria: Secondary | ICD-10-CM

## 2020-10-10 DIAGNOSIS — M810 Age-related osteoporosis without current pathological fracture: Secondary | ICD-10-CM | POA: Diagnosis not present

## 2020-10-10 DIAGNOSIS — I700 Atherosclerosis of aorta: Secondary | ICD-10-CM

## 2020-10-10 DIAGNOSIS — R059 Cough, unspecified: Secondary | ICD-10-CM

## 2020-10-10 DIAGNOSIS — G8929 Other chronic pain: Secondary | ICD-10-CM

## 2020-10-10 DIAGNOSIS — R7309 Other abnormal glucose: Secondary | ICD-10-CM

## 2020-10-10 DIAGNOSIS — R6889 Other general symptoms and signs: Secondary | ICD-10-CM | POA: Diagnosis not present

## 2020-10-10 DIAGNOSIS — Z0000 Encounter for general adult medical examination without abnormal findings: Secondary | ICD-10-CM

## 2020-10-10 DIAGNOSIS — Z6827 Body mass index (BMI) 27.0-27.9, adult: Secondary | ICD-10-CM

## 2020-10-10 DIAGNOSIS — E782 Mixed hyperlipidemia: Secondary | ICD-10-CM

## 2020-10-10 DIAGNOSIS — K589 Irritable bowel syndrome without diarrhea: Secondary | ICD-10-CM

## 2020-10-10 DIAGNOSIS — G629 Polyneuropathy, unspecified: Secondary | ICD-10-CM

## 2020-10-10 DIAGNOSIS — Z85528 Personal history of other malignant neoplasm of kidney: Secondary | ICD-10-CM

## 2020-10-10 DIAGNOSIS — F419 Anxiety disorder, unspecified: Secondary | ICD-10-CM

## 2020-10-10 DIAGNOSIS — H9193 Unspecified hearing loss, bilateral: Secondary | ICD-10-CM

## 2020-10-10 DIAGNOSIS — Z79899 Other long term (current) drug therapy: Secondary | ICD-10-CM

## 2020-10-10 DIAGNOSIS — I10 Essential (primary) hypertension: Secondary | ICD-10-CM

## 2020-10-10 DIAGNOSIS — K449 Diaphragmatic hernia without obstruction or gangrene: Secondary | ICD-10-CM

## 2020-10-10 NOTE — Patient Instructions (Addendum)
   Try Cetirizine (Zyrtec) one tablet every night.  This is an antihistamine to help dry up fluid.  It can make you sleepy.  If this is not effective you can also try Levocetirazine (Xyzal).  Take one tablet every night.  Can also cause sleepiness.   You can continue to take the fexofenadine (Allegra) with this medications.  If you do not improve or have any new or worsening symptoms, send [place] a message via MyChart.      Kimberly Wilcox , Thank you for taking time to come for your Medicare Wellness Visit. I appreciate your ongoing commitment to your health goals. Please review the following plan we discussed and let me know if I can assist you in the future.   These are the goals we discussed: Goals    . Blood Pressure < 140/90    . Exercise 150 min/wk Moderate Activity       This is a list of the screening recommended for you and due dates:  Health Maintenance  Topic Date Due  . COVID-19 Vaccine (1) Never done  . Tetanus Vaccine  10/05/2019  . Colon Cancer Screening  02/26/2026  . Flu Shot  Completed  . DEXA scan (bone density measurement)  Completed  .  Hepatitis C: One time screening is recommended by Center for Disease Control  (CDC) for  adults born from [birth year] through 1966.   Completed  . Pneumonia vaccines  Completed     GENERAL HEALTH GOALS  Know what a healthy weight is for you (roughly BMI <25) and aim to maintain this  Aim for 7+ servings of fruits and vegetables daily  70-80+ fluid ounces of water or unsweet tea for healthy kidneys  Limit to max 1 drink of alcohol per day; avoid smoking/tobacco  Limit animal fats in diet for cholesterol and heart health - choose grass fed whenever available  Avoid highly processed foods, and foods high in saturated/trans fats  Aim for low stress - take time to unwind and care for your mental health  Aim for 150 min of moderate intensity exercise weekly for heart health, and weights twice weekly for bone health  Aim for 7-9  hours of sleep daily

## 2020-10-10 NOTE — Progress Notes (Signed)
MEDICARE ANNUAL WELLNESS  Assessment:    Encounter for Medicare Annal Wellness exam Yearly  Chronic nonintractable headache, unspecified headache type Improved Has cyclobenzaprine (FLEXERIL) 10 MG tablet; Take 1 tablet (10 mg total) by mouth at bedtime as needed (TMJ/jaw pain). PRN  Atherosclerosis of aorta (HCC) Control blood pressure, cholesterol, glucose, increase exercise.  Per CT 04/03/20  Labile hypertension Continue current medications: Monitor blood pressure at home; call if consistently over 130/80 Continue DASH diet.   Reminder to go to the ER if any CP, SOB, nausea, dizziness, severe HA, changes vision/speech, left arm numbness and tingling and jaw pain. -     CBC with Differential/Platelet -     BASIC METABOLIC PANEL WITH GFR -     TSH  Neuropathy Improved  Hyperlipidemia, unspecified hyperlipidemia type lifestyle controlled Discussed dietary and exercise modifications Low fat diet   Abnormal glucose Discussed general issues about diabetes pathophysiology and management., Educational material distributed., Suggested low cholesterol diet., Encouraged aerobic exercise., Discussed foot care., Reminded to get yearly retinal exam. -     Hemoglobin A1c  History of renal cell cancer monitor  Osteoporosis, unspecified osteoporosis type, unspecified pathological fracture presence continue Vit D and Ca, weight bearing exercises  Hiatal hernia Continue PPI/H2 blocker, diet discussed  Gastroesophageal reflux disease with esophagitis Continue PPI/H2 blocker, diet discussed  Anxiety continue medications has diazepam 2mg  PRN Stress management techniques discussed, increase water, good sleep hygiene discussed, increase exercise, and increase veggies.   Vitamin D deficiency Continue supplement  Memory changes Follows Neuro Cut back on valium use No changes  Medication management Continued  Acute Rhinitis Continue fexofenadine 180mg  daily Add Cetirizine  or levoctirazine nightly Cough likely related to drainage  Contact office with any new or worsening symptoms.   Consider ABX treatment is no improvement with antihistamine treatment related to duration.  Over 40 minutes of face to face interview, exam, counseling, chart review and critical decision making was performed.  Future Appointments  Date Time Provider [place]  03/11/2021 10:00 AM [name], NP [facility] None  10/13/2021  9:00 AM [name], NP [facility] None      Subjective:  Kimberly Wilcox is a 75 y.o. female who presents neck pain and wellness visit.   She report she has a cough that has been going on for the past 10days.  She is taking flonase nasal spray and fexofenadine for her allergies.  She reports it is non productive ans she is having. Otherwise she reports she has been doing well.  She does not have any medication concerns today.  Previous OV: Denies weakness in his arms, trouble with grip, worse headache ever but has been having daily headaches. Denies fever, chills.  Has been having ear pain, left worse than right, did improve with ABX for UTI. Not worse with brushing teeth, no blurry vision/changes in vision.  Has decreased hearing left ear with tinnitus, a little off balance occ but no dizziness, no falls.  Denies clinching of her teeth/TMJ.  Saw her dentist a few years ago.  Left 2nd finger had numbness.   Xray 2014 Findings: Vertebral body height is normal.  Trace anterolisthesis of C4 on C5 due to facet arthropathy is noted.  Neural foramina appear patent.  Prevertebral soft tissues appear normal.  Lung apices are clear.  IMPRESSION: No acute finding.  Facet arthropathy causes trace anterolisthesis C4 on C5.  She has had a lot of anxiety, she normally travels but has not been able to. She has  been very anxious. Can not tolerate SSRI, will take valium 1/2 pill, taking 1 pill every other day which is more than  ususal due to coronavirus anxiety.   She continues to have AB discomfort, upper GI issues, last colonoscopy and EGD in 2017.  Follows with Dr. Watt Climes. Had slightly abnormal HIDA in 2018. Had CT AB dec 2020 IMPRESSION: Changes of prior granulomatous disease.  Mild distention of the gallbladder without complicating factors. Correlation with laboratory values is recommended  Status post left nephrectomy.  Nonobstructing right renal stone.  Her blood pressure has been controlled at home, today their BP is BP: 136/80  She does workout, walking and states that her neuropathy has improved. She denies chest pain, shortness of breath, dizziness.  She is not on cholesterol medication and denies myalgias. Her cholesterol is at goal. The cholesterol last visit was:   Lab Results  Component Value Date   CHOL 194 06/12/2020   HDL 73 06/12/2020   LDLCALC 101 (H) 06/12/2020   TRIG 105 06/12/2020   CHOLHDL 2.7 06/12/2020    She has been working on diet and exercise for prediabetes, and denies paresthesia of the feet, polydipsia, polyuria and visual disturbances. Last A1C in the office was:  Lab Results  Component Value Date   HGBA1C 5.6 06/12/2020   Lab Results  Component Value Date   GFRNONAA 81 06/12/2020   Patient is on Vitamin D supplement.   Lab Results  Component Value Date   VD25OH 33 06/12/2020   BMI is Body mass index is 27.1 kg/m., she is working on diet and exercise. Wt Readings from Last 3 Encounters:  10/10/20 143 lb 6.4 oz (65 kg)  08/29/20 142 lb (64.4 kg)  07/12/20 141 lb 12.8 oz (64.3 kg)       Medication Review:   Current Outpatient Medications (Cardiovascular):  .  nitroGLYCERIN (NITROSTAT) 0.4 MG SL tablet, Place 1 tablet (0.4 mg total) under the tongue every 5 (five) minutes as needed for chest pain.  Current Outpatient Medications (Respiratory):  .  fexofenadine (ALLEGRA) 180 MG tablet, Take 1 tablet (180 mg total) by mouth daily. .  fluticasone  (FLONASE) 50 MCG/ACT nasal spray, Place 2 sprays into both nostrils at bedtime. Marland Kitchen  PROAIR HFA 108 (90 Base) MCG/ACT inhaler, INHALE 2 PUFFS INTO THE LUNGS EVERY 4 HOURS AS NEEDED FOR WHEEZING OR SHORTNESS OF BREATH  Current Outpatient Medications (Analgesics):  .  acetaminophen (TYLENOL) 325 MG tablet, Take 650 mg by mouth every 6 (six) hours as needed. .  naproxen sodium (ALEVE) 220 MG tablet, Take 220 mg by mouth 2 (two) times daily as needed (for headaches or pain).   Current Outpatient Medications (Other):  Marland Kitchen  B Complex-C (SUPER B COMPLEX PO), Take 1 tablet by mouth daily.  .  Cholecalciferol (VITAMIN D) 2000 units tablet, Take 2,000 Units by mouth daily.  .  cyclobenzaprine (FLEXERIL) 10 MG tablet, Take 1 tablet (10 mg total) by mouth at bedtime as needed (TMJ/jaw pain). .  diazepam (VALIUM) 2 MG tablet, Take     1/2 to 1 tablet     2 to 3 x /day     for Acute Anxiety &  please try to limit to 5 days /week to avoid Addiction .  dicyclomine (BENTYL) 20 MG tablet, Take 1 tablet 3 x /day before meals for Abdominal Discomfort, Cramping, Nausea, Bloating or Diarrhea .  latanoprost (XALATAN) 0.005 % ophthalmic solution, Place 1 drop into both eyes at bedtime.  Marland Kitchen  Magnesium 250 MG TABS, Take 250 mg by mouth daily. Marland Kitchen  omeprazole (PRILOSEC) 40 MG capsule, Take 1 capsule (40 mg total) by mouth 2 (two) times daily. .  ondansetron (ZOFRAN ODT) 8 MG disintegrating tablet, 8mg  ODT q4 hours prn nausea   Allergies  Allergen Reactions  . Meloxicam Itching  . Nitrofurantoin Itching and Other (See Comments)    "Flu-like" symptoms, also  . Nitrofurantoin Monohyd Macro Itching and Other (See Comments)    "Flu-like" symptoms, also  . Sulfa Antibiotics Hives and Itching    Current Problems (verified) Patient Active Problem List   Diagnosis Date Noted  . Decreased hearing of both ears 07/12/2020  . Essential hypertension 06/12/2020  . Atherosclerosis of aorta ([place]) 04/14/2018  . Neuropathy  01/09/2015  . Hyperlipidemia, mixed 10/29/2014  . Abnormal glucose 10/29/2014  . History of renal cell cancer 10/29/2014  . Osteoporosis 10/29/2014  . Vitamin D deficiency   . GERD (gastroesophageal reflux disease)   . Anxiety   . Hiatal hernia     Screening Tests Immunization History  Administered Date(s) Administered  . Influenza, High Dose Seasonal PF 06/22/2017, 06/14/2018, 06/19/2019, 06/12/2020  . Influenza, Seasonal, Injecte, Preservative Fre 07/28/2016  . Pneumococcal Conjugate-13 09/17/2015  . Pneumococcal Polysaccharide-23 10/04/2012  . Td 10/04/2009   Preventative care: Last colonoscopy:01/2016, Dr. Amedeo Plenty EGD 2017 HIDA 06/2017 was decreased Last mammogram: 10/2019, scheduled Last pap smear/pelvic exam: 2011  remote DEXA: 10/2019 osteopenia MRI brain 10/2014 Ct AB 60/6301 US carotids LICA <6010 [place] AB 9323 CXR 2017  Prior vaccinations: TD or Tdap: 2011 DUE will get tetanus next OV, declines today Influenza: 2020 Pneumococcal: 2014 Prevnar13: 2017 Shingles/Zostavax: declines COVID not interested at this time  Names of Other Physician/Practitioners you currently use: 1. [facility] here for primary care 2. Dr. Katy Fitch, eye doctor, last visit 2021 3. Gwyndolyn Saxon, dentist, last visit 2019, DUE Patient Care Team: Unk Pinto, MD as PCP - General (Internal Medicine) Warden Fillers, MD as Consulting Physician (Optometry) Josue Hector, MD as Consulting Physician (Cardiology) Alexis Frock, MD as Consulting Physician (Urology) Teena Irani, MD (Inactive) as Consulting Physician (Gastroenterology)  SURGICAL HISTORY She  has a past surgical history that includes Nephrectomy (Left, 1999); Left oophorectomy (1985); and Tubal ligation. FAMILY HISTORY Her family history includes Lupus in her granddaughter; Other in her brother; Pneumonia in her father. SOCIAL HISTORY She  reports that she quit smoking about 35 years ago.  She has never used smokeless tobacco. She reports current alcohol use. She reports that she does not use drugs. Wine once a week  MEDICARE WELLNESS OBJECTIVES: Physical activity: Current Exercise Habits: Home exercise routine, Type of exercise: walking, Time (Minutes): 30, Frequency (Times/Week): 7, Weekly Exercise (Minutes/Week): 210, Intensity: Moderate Cardiac risk factors: Cardiac Risk Factors include: advanced age (>70men, >73 women);hypertension;dyslipidemia Depression/mood screen:   Depression screen [facility] 2/9 10/10/2020  Decreased Interest 0  Down, Depressed, Hopeless 0  PHQ - 2 Score 0    ADLs:  In your present state of health, do you have any difficulty performing the following activities: 10/10/2020 11/06/2019  Hearing? N N  Vision? N N  Difficulty concentrating or making decisions? N N  Walking or climbing stairs? N N  Dressing or bathing? N N  Doing errands, shopping? N N  Preparing Food and eating ? N -  Using the Toilet? N -  In the past six months, have you accidently leaked urine? N -  Do you have problems with loss of bowel  control? N -  Managing your Medications? N -  Managing your Finances? N -  Housekeeping or managing your Housekeeping? N -  Some recent data might be hidden     Cognitive Testing  Alert? Yes  Normal Appearance?Yes  Oriented to person? Yes  Place? Yes   Time? Yes  Recall of three objects?  Yes  Can perform simple calculations? Yes  Displays appropriate judgment?Yes  Can read the correct time from a watch face?Yes  EOL planning: Does Patient Have a Medical Advance Directive?: Yes Type of Advance Directive: [place] will [place] in Chart?: Yes - validated most recent copy scanned in chart (See row information)    Review of Systems  Constitutional: Negative.   HENT: Negative.   Eyes: Negative for blurred vision, double vision, photophobia, pain, discharge and redness.  Respiratory:  Negative.   Cardiovascular: Negative.   Gastrointestinal: Positive for abdominal pain. Negative for blood in stool, constipation, diarrhea, heartburn, melena, nausea and vomiting.  Genitourinary: Positive for frequency. Negative for dysuria, flank pain, hematuria and urgency.  Musculoskeletal: Positive for neck pain.  Skin: Negative.   Neurological: Negative for dizziness, tingling, tremors, sensory change, speech change, focal weakness, seizures and loss of consciousness.  Psychiatric/Behavioral: Negative for depression, hallucinations, memory loss, substance abuse and suicidal ideas. The patient is not nervous/anxious and does not have insomnia.      Objective:     Today's Vitals   10/10/20 0922  BP: 136/80  Pulse: 73  Temp: 98.1 F (36.7 C)  SpO2: 95%  Weight: 143 lb 6.4 oz (65 kg)  Height: 5\' 1"  (1.549 m)   Body mass index is 27.1 kg/m.  General appearance: alert, no distress, WD/WN, female HEENT: normocephalic, sclerae anicteric, TMs pearly, nares patent, no discharge or erythema, pharynx normal Oral cavity: MMM, white plaque on upper right gum, not scrapable. Neck: supple, no lymphadenopathy, no thyromegaly, no masses Heart: RRR, normal S1, S2, no murmurs Lungs: CTA bilaterally, no wheezes, rhonchi, or rales Abdomen: +bs, soft, diffuse tenderness, non distended, no masses, no hepatomegaly, no splenomegaly Musculoskeletal: nontender, no swelling, no obvious deformity, normal range of motion, without spinous process tenderness, with paraspinal muscle tenderness the left side, normal sensation, reflexes, and pulses distal. + TMJ tenderness Extremities: no edema, no cyanosis, no clubbing Pulses: 2+ symmetric, upper and lower extremities, normal cap refill Neurological: alert, oriented x 3, CN2-12 intact, strength normal upper extremities and lower extremities, sensation decreased bilateral feet, DTRs 2+ throughout, no cerebellar signs, gait normal Psychiatric: normal affect,  behavior normal, pleasant    Medicare Attestation I have personally reviewed: The patient's medical and social history Their use of alcohol, tobacco or illicit drugs Their current medications and supplements The patient's functional ability including ADLs,fall risks, home safety risks, cognitive, and hearing and visual impairment Diet and physical activities Evidence for depression or mood disorders  The patient's weight, height, BMI, and visual acuity have been recorded in the chart.  I have made referrals, counseling, and provided education to the patient based on review of the above and I have provided the patient with a written personalized care plan for preventive services.     [name], NP   10/10/2020

## 2020-10-11 LAB — CBC WITH DIFFERENTIAL/PLATELET
Absolute Monocytes: 490 cells/uL (ref 200–950)
Basophils Absolute: 72 cells/uL (ref 0–200)
Basophils Relative: 1 %
Eosinophils Absolute: 252 cells/uL (ref 15–500)
Eosinophils Relative: 3.5 %
HCT: 42.6 % (ref 35.0–45.0)
Hemoglobin: 13.9 g/dL (ref 11.7–15.5)
Lymphs Abs: 1973 cells/uL (ref 850–3900)
MCH: 27.7 pg (ref 27.0–33.0)
MCHC: 32.6 g/dL (ref 32.0–36.0)
MCV: 85 fL (ref 80.0–100.0)
MPV: 9.9 fL (ref 7.5–12.5)
Monocytes Relative: 6.8 %
Neutro Abs: 4414 cells/uL (ref 1500–7800)
Neutrophils Relative %: 61.3 %
Platelets: 323 10*3/uL (ref 140–400)
RBC: 5.01 10*6/uL (ref 3.80–5.10)
RDW: 12.9 % (ref 11.0–15.0)
Total Lymphocyte: 27.4 %
WBC: 7.2 10*3/uL (ref 3.8–10.8)

## 2020-10-11 LAB — URINALYSIS W MICROSCOPIC + REFLEX CULTURE
Bacteria, UA: NONE SEEN /HPF
Bilirubin Urine: NEGATIVE
Glucose, UA: NEGATIVE
Hgb urine dipstick: NEGATIVE
Hyaline Cast: NONE SEEN /LPF
Ketones, ur: NEGATIVE
Leukocyte Esterase: NEGATIVE
Nitrites, Initial: NEGATIVE
Protein, ur: NEGATIVE
RBC / HPF: NONE SEEN /HPF (ref 0–2)
Specific Gravity, Urine: 1.008 (ref 1.001–1.03)
Squamous Epithelial / HPF: NONE SEEN /HPF (ref <=5)
WBC, UA: NONE SEEN /HPF (ref 0–5)
pH: 7.5 (ref 5.0–8.0)

## 2020-10-11 LAB — COMPLETE METABOLIC PANEL WITH GFR
AG Ratio: 1.6 (calc) (ref 1.0–2.5)
ALT: 6 U/L (ref 6–29)
AST: 19 U/L (ref 10–35)
Albumin: 4.5 g/dL (ref 3.6–5.1)
Alkaline phosphatase (APISO): 69 U/L (ref 37–153)
BUN: 14 mg/dL (ref 7–25)
CO2: 29 mmol/L (ref 20–32)
Calcium: 9.5 mg/dL (ref 8.6–10.4)
Chloride: 103 mmol/L (ref 98–110)
Creat: 0.76 mg/dL (ref 0.60–0.93)
GFR, Est African American: 89 mL/min/{1.73_m2} (ref >=60)
GFR, Est Non African American: 77 mL/min/{1.73_m2} (ref >=60)
Globulin: 2.9 g/dL (calc) (ref 1.9–3.7)
Glucose, Bld: 84 mg/dL (ref 65–99)
Potassium: 4.2 mmol/L (ref 3.5–5.3)
Sodium: 139 mmol/L (ref 135–146)
Total Bilirubin: 0.6 mg/dL (ref 0.2–1.2)
Total Protein: 7.4 g/dL (ref 6.1–8.1)

## 2020-10-11 LAB — NO CULTURE INDICATED

## 2020-10-11 LAB — MAGNESIUM: Magnesium: 2 mg/dL (ref 1.5–2.5)

## 2020-12-09 ENCOUNTER — Telehealth: Payer: Self-pay

## 2020-12-09 NOTE — Telephone Encounter (Signed)
Patient states that she has had a cough for 2 weeks and has acid reflux. When she coughs, her chest hurts very badly and also has a "knot" when she swallows. No appointments available for the week.

## 2020-12-09 NOTE — Telephone Encounter (Signed)
Patient is going to call GI to see if she can be seen there. Otherwise will call me back.

## 2021-01-02 DIAGNOSIS — K219 Gastro-esophageal reflux disease without esophagitis: Secondary | ICD-10-CM | POA: Diagnosis not present

## 2021-01-02 DIAGNOSIS — K5901 Slow transit constipation: Secondary | ICD-10-CM | POA: Diagnosis not present

## 2021-01-02 DIAGNOSIS — R1013 Epigastric pain: Secondary | ICD-10-CM | POA: Diagnosis not present

## 2021-01-02 DIAGNOSIS — R935 Abnormal findings on diagnostic imaging of other abdominal regions, including retroperitoneum: Secondary | ICD-10-CM | POA: Diagnosis not present

## 2021-01-14 NOTE — Progress Notes (Signed)
Assessment and Plan:  Kimberly Wilcox was seen today for bite.  Diagnoses and all orders for this visit:  Spider bite wound, accidental or unintentional, initial encounter Not witnessed, presumptive, other insect or snake bite might also be possible, at 5 days and stable. Concerning for mild but progressive local skin infection per home documentation/photos, will treat with doxycycline and boost overdue tetanus.  Otherwise benign exam; monitor, follow up if needed if any new concerning sx or not improving as expected.  -     doxycycline (VIBRAMYCIN) 100 MG capsule; Take 1 capsule twice daily with food  Need for diphtheria-tetanus-pertussis (Tdap) vaccine -     Tdap vaccine greater than or equal to 7yo IM  Further disposition pending results of labs. Discussed med's effects and SE's.   Over 15 minutes of exam, counseling, chart review, and critical decision making was performed.   Future Appointments  Date Time Provider [place]  03/11/2021  9:30 AM Liane Comber, NP [facility] None  10/13/2021  9:00 AM McClanahan, Danton Sewer, NP [facility] None    ------------------------------------------------------------------------------------------------------------------   HPI BP (!) 156/82   Pulse 77   Temp (!) 97.5 F (36.4 C)   Wt 143 lb (64.9 kg)   SpO2 98%   BMI 27.02 kg/m  75 y.o.female presents for evaluation of possible spider bite or snake bite.   She reports was walking in her garden 5 days ago and had abrupt pain of the right inner ankle, didn't think anything of it but noted 2 scabs to area later that evening. She reports that night had some blurry vision, frontal headache, nausea, localized itching that evening. She reports this has gradually improved, however seems localized redness is mildly progressive and getting worse per photos. Has been putting alcohol and triple antibiotic.   Last Td 10/04/2009, daughter is trying to get prenant, likely grandchild soon.    Past Medical  History:  Diagnosis Date  . Anxiety   . Chest discomfort   . GERD (gastroesophageal reflux disease)   . Hiatal hernia   . Hypertension    labile, normal cath 2013  . Osteoporosis   . Renal cell cancer ([place]) 1999   LEFT NEPHRECTOMY  . Vitamin D deficiency      Allergies  Allergen Reactions  . Meloxicam Itching  . Nitrofurantoin Itching and Other (See Comments)    "Flu-like" symptoms, also  . Nitrofurantoin Monohyd Macro Itching and Other (See Comments)    "Flu-like" symptoms, also  . Sulfa Antibiotics Hives and Itching    Current Outpatient Medications on File Prior to Visit  Medication Sig  . acetaminophen (TYLENOL) 325 MG tablet Take 650 mg by mouth every 6 (six) hours as needed.  . B Complex-C (SUPER B COMPLEX PO) Take 1 tablet by mouth daily.   . Cholecalciferol (VITAMIN D) 2000 units tablet Take 2,000 Units by mouth daily.   . cyclobenzaprine (FLEXERIL) 10 MG tablet Take 1 tablet (10 mg total) by mouth at bedtime as needed (TMJ/jaw pain).  . diazepam (VALIUM) 2 MG tablet Take     1/2 to 1 tablet     2 to 3 x /day     for Acute Anxiety &  please try to limit to 5 days /week to avoid Addiction  . dicyclomine (BENTYL) 20 MG tablet Take 1 tablet 3 x /day before meals for Abdominal Discomfort, Cramping, Nausea, Bloating or Diarrhea  . fexofenadine (ALLEGRA) 180 MG tablet Take 1 tablet (180 mg total) by mouth daily.  . fluticasone (FLONASE)  50 MCG/ACT nasal spray Place 2 sprays into both nostrils at bedtime.  Marland Kitchen latanoprost (XALATAN) 0.005 % ophthalmic solution Place 1 drop into both eyes at bedtime.   . Magnesium 250 MG TABS Take 250 mg by mouth daily.  . naproxen sodium (ALEVE) 220 MG tablet Take 220 mg by mouth 2 (two) times daily as needed (for headaches or pain).  . nitroGLYCERIN (NITROSTAT) 0.4 MG SL tablet Place 1 tablet (0.4 mg total) under the tongue every 5 (five) minutes as needed for chest pain.  Marland Kitchen omeprazole (PRILOSEC) 40 MG capsule Take 1 capsule (40 mg total) by  mouth 2 (two) times daily.  . ondansetron (ZOFRAN ODT) 8 MG disintegrating tablet 8mg  ODT q4 hours prn nausea  . PROAIR HFA 108 (90 Base) MCG/ACT inhaler INHALE 2 PUFFS INTO THE LUNGS EVERY 4 HOURS AS NEEDED FOR WHEEZING OR SHORTNESS OF BREATH   No current facility-administered medications on file prior to visit.    ROS: all negative except above.   Physical Exam:  BP (!) 156/82   Pulse 77   Temp (!) 97.5 F (36.4 C)   Wt 143 lb (64.9 kg)   SpO2 98%   BMI 27.02 kg/m   General Appearance: Well nourished, in no apparent distress. Eyes: PERRLA, EOMs, conjunctiva no swelling or erythema ENT/Mouth: No erythema, swelling, or exudate on post pharynx.  Tonsils not swollen or erythematous. Hearing normal.  Neck: Supple Respiratory: Respiratory effort normal, BS equal bilaterally without rales, rhonchi, wheezing or stridor.  Cardio: RRR with no MRGs. Brisk peripheral pulses without edema.  Abdomen: Soft, + BS.  Non tender Lymphatics: Non tender without lymphadenopathy.  Musculoskeletal: Full ROM, 5/5 strength, normal gait.  Skin: Warm, dry without rashes, ecchymosis. R inner ankle with small scabs approx 2 cm apart with surrounding localized erythema approx 2.5 cm area, irregular.  Neuro: Cranial nerves intact. Normal muscle tone, Sensation intact.  Psych: Awake and oriented X 3, normal affect, Insight and Judgment appropriate.     Izora Ribas, NP 10:46 AM [facility]

## 2021-01-15 ENCOUNTER — Encounter: Payer: Self-pay | Admitting: Adult Health

## 2021-01-15 ENCOUNTER — Other Ambulatory Visit: Payer: Self-pay

## 2021-01-15 ENCOUNTER — Ambulatory Visit: Payer: Medicare PPO | Admitting: Adult Health

## 2021-01-15 VITALS — BP 156/82 | HR 77 | Temp 97.5°F | Wt 143.0 lb

## 2021-01-15 DIAGNOSIS — T63301A Toxic effect of unspecified spider venom, accidental (unintentional), initial encounter: Secondary | ICD-10-CM | POA: Diagnosis not present

## 2021-01-15 DIAGNOSIS — Z23 Encounter for immunization: Secondary | ICD-10-CM | POA: Diagnosis not present

## 2021-01-15 MED ORDER — DOXYCYCLINE HYCLATE 100 MG PO CAPS: ORAL_CAPSULE | ORAL | 0 refills | Status: AC

## 2021-01-15 NOTE — Patient Instructions (Signed)
Snake Bite Snake bite is an injury to the skin or the deeper tissues beneath the skin that is caused by a snake. There are two types of snakes: poisonous (venomous) and nonpoisonous (nonvenomous). A nonvenomous snake will cause a wound. A venomous snake will cause a wound and may also inject poison (venom) into the wound. The effects of snake venom vary depending on the type of snake. In some cases, the effects can be extremely serious or even deadly. A bite from a venomous snake is a medical emergency. Treatment may require the use of antivenom medicine. What are the causes? This injury is caused by a venomous snake or a nonvenomous snake. What increases the risk? You are more likely to get a snake bite if:  You walk or hike in outdoor areas.  You do not cover your arms and legs with clothing when hiking.  You provoke or try to pick up a snake. What are the signs or symptoms? Symptoms of a snake bite vary depending on the type of snake, whether the snake is venomous, and the severity of the bite.  Symptoms for both a venomous or nonvenomous snake may include:  Pain, redness, and swelling at the site of the bite.  Skin discoloration at the site of the bite.  A feeling of nervousness. Symptoms of a venomous snake bite may also include:  Increasing pain and swelling.  Severe anxiety or confusion.  Blood blisters or purple spots in the bite area.  Nausea and vomiting.  Numbness or tingling.  Muscle weakness.  Excessive fatigue or drowsiness.  Excessive sweating.  Difficulty breathing.  Blurred vision.  Bruising and bleeding at the site of the bite.  Feeling faint or light-headed. In some cases, symptoms do not develop until a few hours after the bite. How is this diagnosed? This condition may be diagnosed based on symptoms and a physical exam. Your health care provider will examine the bite area and ask for details about the snake to help determine whether it is  venomous. You may also have tests, including blood tests. How is this treated? Treatment depends on the severity of the bite and whether the snake is venomous.  Treatment for nonvenomous snake bites may include basic wound care. This includes cleaning the wound and applying a bandage (dressing).  Treatment for venomous snake bites may include antivenom medicine in addition to wound care. This medicine needs to be given as soon as possible after the bite. Other treatments may be needed to help control symptoms as they develop. You may need to stay in a hospital so your condition can be monitored. Your health care provider may prescribe antibiotic medicine to avoid infection in the wound. You may need a tetanus shot if it has been more than 5 years since you had one. Follow these instructions at home: Wound care  Follow instructions from your health care provider about how to take care of your wound. Make sure you: ? Wash your hands with soap and water before you change your dressing. If soap and water are not available, use hand sanitizer. ? Change your dressing as told by your health care provider.  Keep the wound clean and dry. Wash the wound daily with soap and water or a germ-killing (antiseptic) soap as told by your health care provider.  Check your wound every day for signs of infection. Watch for: ? Redness, swelling, or pain that is getting worse. ? Warmth. ? Fluid, blood, or pus.  If you develop  blistering at the site of the bite, protect the blisters from breaking. Do not attempt to open a blister.  Do not take baths, swim, or use a hot tub until your health care provider approves. Ask your health care provider if you may take showers. You may only be allowed to take sponge baths.   Medicines  Take or apply over-the-counter and prescription medicines only as told by your health care provider.  If you were prescribed an antibiotic medicine, take or apply it as told by your  health care provider. Do not stop using the antibiotic even if your condition improves. General instructions  If possible, keep the affected area raised (elevated) above the level of your heart while you are sitting or lying down.  Keep all follow-up visits as told by your health care provider. This is important. Contact a health care provider if you have:  Increased redness, swelling, or pain at the site of your wound.  Fluid, blood, or pus coming from your wound.  A fever. Get help right away if:  You develop blood blisters or purple spots in the bite area.  You have: ? Nausea or vomiting. ? Numbness or tingling. ? Excessive sweating. ? Trouble breathing. ? Trouble seeing.  You feel very confused.  You feel faint or light-headed. Summary  Snake bite is an injury to the skin or the deeper tissues beneath the skin that is caused by a snake.  A nonvenomous snake will cause a wound. A venomous snake will cause a wound and may also inject poison (venom) into the wound.  The effects of snake venom vary depending on the type of snake.  Treatment depends on the severity of the bite and whether the snake is venomous. You may require antivenom or antibiotic medicine after a snake bite. This information is not intended to replace advice given to you by your health care provider. Make sure you discuss any questions you have with your health care provider. Document Revised: 01/26/2018 Document Reviewed: 01/26/2018 Else[employer] Patient Education  [place].

## 2021-01-23 ENCOUNTER — Ambulatory Visit: Payer: Medicare PPO | Admitting: Adult Health

## 2021-01-28 DIAGNOSIS — Z961 Presence of intraocular lens: Secondary | ICD-10-CM | POA: Diagnosis not present

## 2021-01-28 DIAGNOSIS — H2511 Age-related nuclear cataract, right eye: Secondary | ICD-10-CM | POA: Diagnosis not present

## 2021-01-28 DIAGNOSIS — H401131 Primary open-angle glaucoma, bilateral, mild stage: Secondary | ICD-10-CM | POA: Diagnosis not present

## 2021-01-28 DIAGNOSIS — Z9889 Other specified postprocedural states: Secondary | ICD-10-CM | POA: Diagnosis not present

## 2021-01-28 DIAGNOSIS — H532 Diplopia: Secondary | ICD-10-CM | POA: Diagnosis not present

## 2021-01-28 DIAGNOSIS — H35371 Puckering of macula, right eye: Secondary | ICD-10-CM | POA: Diagnosis not present

## 2021-01-28 DIAGNOSIS — H5021 Vertical strabismus, right eye: Secondary | ICD-10-CM | POA: Diagnosis not present

## 2021-02-10 ENCOUNTER — Other Ambulatory Visit: Payer: Self-pay | Admitting: Internal Medicine

## 2021-02-10 MED ORDER — MECLIZINE HCL 25 MG PO TABS: ORAL_TABLET | ORAL | 0 refills | Status: AC

## 2021-02-13 DIAGNOSIS — M545 Low back pain, unspecified: Secondary | ICD-10-CM | POA: Diagnosis not present

## 2021-02-13 DIAGNOSIS — M5417 Radiculopathy, lumbosacral region: Secondary | ICD-10-CM | POA: Diagnosis not present

## 2021-02-24 DIAGNOSIS — R3589 Other polyuria: Secondary | ICD-10-CM | POA: Diagnosis not present

## 2021-03-04 ENCOUNTER — Ambulatory Visit: Payer: Medicare PPO | Admitting: Internal Medicine

## 2021-03-11 ENCOUNTER — Encounter: Payer: Medicare PPO | Admitting: Adult Health

## 2021-03-27 DIAGNOSIS — H2511 Age-related nuclear cataract, right eye: Secondary | ICD-10-CM | POA: Diagnosis not present

## 2021-03-27 DIAGNOSIS — H401111 Primary open-angle glaucoma, right eye, mild stage: Secondary | ICD-10-CM | POA: Diagnosis not present

## 2021-04-03 DIAGNOSIS — R1084 Generalized abdominal pain: Secondary | ICD-10-CM | POA: Diagnosis not present

## 2021-04-03 DIAGNOSIS — K5289 Other specified noninfective gastroenteritis and colitis: Secondary | ICD-10-CM | POA: Diagnosis not present

## 2021-04-03 DIAGNOSIS — K5730 Diverticulosis of large intestine without perforation or abscess without bleeding: Secondary | ICD-10-CM | POA: Diagnosis not present

## 2021-04-03 DIAGNOSIS — R1013 Epigastric pain: Secondary | ICD-10-CM | POA: Diagnosis not present

## 2021-04-03 DIAGNOSIS — K449 Diaphragmatic hernia without obstruction or gangrene: Secondary | ICD-10-CM | POA: Diagnosis not present

## 2021-04-03 DIAGNOSIS — K219 Gastro-esophageal reflux disease without esophagitis: Secondary | ICD-10-CM | POA: Diagnosis not present

## 2021-04-03 DIAGNOSIS — R933 Abnormal findings on diagnostic imaging of other parts of digestive tract: Secondary | ICD-10-CM | POA: Diagnosis not present

## 2021-04-07 DIAGNOSIS — K5289 Other specified noninfective gastroenteritis and colitis: Secondary | ICD-10-CM | POA: Diagnosis not present

## 2021-06-19 ENCOUNTER — Other Ambulatory Visit: Payer: Self-pay | Admitting: Physician Assistant

## 2021-06-19 DIAGNOSIS — R5381 Other malaise: Secondary | ICD-10-CM

## 2021-06-23 DIAGNOSIS — E559 Vitamin D deficiency, unspecified: Secondary | ICD-10-CM | POA: Diagnosis not present

## 2021-06-23 DIAGNOSIS — R413 Other amnesia: Secondary | ICD-10-CM | POA: Diagnosis not present

## 2021-06-23 DIAGNOSIS — F411 Generalized anxiety disorder: Secondary | ICD-10-CM | POA: Diagnosis not present

## 2021-06-23 DIAGNOSIS — I700 Atherosclerosis of aorta: Secondary | ICD-10-CM | POA: Diagnosis not present

## 2021-06-23 DIAGNOSIS — R739 Hyperglycemia, unspecified: Secondary | ICD-10-CM | POA: Diagnosis not present

## 2021-06-23 DIAGNOSIS — I10 Essential (primary) hypertension: Secondary | ICD-10-CM | POA: Diagnosis not present

## 2021-06-23 DIAGNOSIS — Z85528 Personal history of other malignant neoplasm of kidney: Secondary | ICD-10-CM | POA: Diagnosis not present

## 2021-06-23 DIAGNOSIS — M810 Age-related osteoporosis without current pathological fracture: Secondary | ICD-10-CM | POA: Diagnosis not present

## 2021-06-23 DIAGNOSIS — E785 Hyperlipidemia, unspecified: Secondary | ICD-10-CM | POA: Diagnosis not present

## 2021-10-08 NOTE — Progress Notes (Unsigned)
MEDICARE ANNUAL WELLNESS  Assessment:    Encounter for Medicare Annal Wellness exam Yearly  Chronic nonintractable headache, unspecified headache type Improved Has cyclobenzaprine (FLEXERIL) 10 MG tablet; Take 1 tablet (10 mg total) by mouth at bedtime as needed (TMJ/jaw pain). PRN  Atherosclerosis of aorta (HCC) Control blood pressure, cholesterol, glucose, increase exercise.  Per CT 04/03/20  Labile hypertension Continue current medications: Monitor blood pressure at home; call if consistently over 130/80 Continue DASH diet.   Reminder to go to the ER if any CP, SOB, nausea, dizziness, severe HA, changes vision/speech, left arm numbness and tingling and jaw pain. -     CBC with Differential/Platelet -     CMP -     TSH - EKG  Neuropathy Improved  Hyperlipidemia, unspecified hyperlipidemia type lifestyle controlled Discussed dietary and exercise modifications Low fat diet   Abnormal glucose Discussed general issues about diabetes pathophysiology and management., Educational material distributed., Suggested low cholesterol diet., Encouraged aerobic exercise., Discussed foot care., Reminded to get yearly retinal exam. -     Hemoglobin A1c  History of renal cell cancer monitor  Osteoporosis, unspecified osteoporosis type, unspecified pathological fracture presence continue Vit D and Ca, weight bearing exercises  Hiatal hernia Continue PPI/H2 blocker, diet discussed  Gastroesophageal reflux disease with esophagitis Continue PPI/H2 blocker, diet discussed  Anxiety continue medications has diazepam 2mg  PRN Stress management techniques discussed, increase water, good sleep hygiene discussed, increase exercise, and increase veggies.   Vitamin D deficiency Continue supplement  Memory changes Follows Neuro Cut back on valium use No changes  Medication management Continued  Screening for hematuria or proteinuria -Routine UA with reflex microscopic -  Microalbumin/creatinine urine ratio  Screening for ischemic heart disease EKG   Over 40 minutes of face to face interview, exam, counseling, chart review and critical decision making was performed.  Future Appointments  Date Time Provider [place]  10/13/2021  9:00 AM Magda Bernheim, NP [facility] None      Subjective:  Kimberly Wilcox is a 76 y.o. female who presents neck pain and wellness visit.   She report she has a cough that has been going on for the past 10days.  She is taking flonase nasal spray and fexofenadine for her allergies.  She reports it is non productive ans she is having. Otherwise she reports she has been doing well.  She does not have any medication concerns today.  Previous OV: Denies weakness in his arms, trouble with grip, worse headache ever but has been having daily headaches. Denies fever, chills.  Has been having ear pain, left worse than right, did improve with ABX for UTI. Not worse with brushing teeth, no blurry vision/changes in vision.  Has decreased hearing left ear with tinnitus, a little off balance occ but no dizziness, no falls.  Denies clinching of her teeth/TMJ.  Saw her dentist a few years ago.  Left 2nd finger had numbness.   Xray 2014 Findings: Vertebral body height is normal.  Trace anterolisthesis of C4 on C5 due to facet arthropathy is noted.  Neural foramina appear patent.  Prevertebral soft tissues appear normal.  Lung apices are clear.   IMPRESSION: No acute finding.  Facet arthropathy causes trace anterolisthesis C4 on C5.   She has had a lot of anxiety, she normally travels but has not been able to. She has been very anxious. Can not tolerate SSRI, will take valium 1/2 pill, taking 1 pill every other day which is more than ususal due to  coronavirus anxiety.   She continues to have AB discomfort, upper GI issues, last colonoscopy and EGD in 2017.  Follows with Dr. Watt Climes. Had slightly abnormal HIDA in 2018. Had  CT AB dec 2020 IMPRESSION: Changes of prior granulomatous disease.   Mild distention of the gallbladder without complicating factors. Correlation with laboratory values is recommended   Status post left nephrectomy.   Nonobstructing right renal stone.  Her blood pressure has been controlled at home, today their BP is    She does workout, walking and states that her neuropathy has improved. She denies chest pain, shortness of breath, dizziness.  She is not on cholesterol medication and denies myalgias. Her cholesterol is at goal. The cholesterol last visit was:   Lab Results  Component Value Date   CHOL 194 06/12/2020   HDL 73 06/12/2020   LDLCALC 101 (H) 06/12/2020   TRIG 105 06/12/2020   CHOLHDL 2.7 06/12/2020    She has been working on diet and exercise for prediabetes, and denies paresthesia of the feet, polydipsia, polyuria and visual disturbances. Last A1C in the office was:  Lab Results  Component Value Date   HGBA1C 5.6 06/12/2020   Lab Results  Component Value Date   GFRNONAA 77 10/10/2020   Patient is on Vitamin D supplement.   Lab Results  Component Value Date   VD25OH 33 06/12/2020   BMI is There is no height or weight on file to calculate BMI., she is working on diet and exercise. Wt Readings from Last 3 Encounters:  01/15/21 143 lb (64.9 kg)  10/10/20 143 lb 6.4 oz (65 kg)  08/29/20 142 lb (64.4 kg)       Medication Review:   Current Outpatient Medications (Cardiovascular):    nitroGLYCERIN (NITROSTAT) 0.4 MG SL tablet, Place 1 tablet (0.4 mg total) under the tongue every 5 (five) minutes as needed for chest pain.  Current Outpatient Medications (Respiratory):    fexofenadine (ALLEGRA) 180 MG tablet, Take 1 tablet (180 mg total) by mouth daily.   fluticasone (FLONASE) 50 MCG/ACT nasal spray, Place 2 sprays into both nostrils at bedtime.   PROAIR HFA 108 (90 Base) MCG/ACT inhaler, INHALE 2 PUFFS INTO THE LUNGS EVERY 4 HOURS AS NEEDED FOR WHEEZING OR  SHORTNESS OF BREATH  Current Outpatient Medications (Analgesics):    acetaminophen (TYLENOL) 325 MG tablet, Take 650 mg by mouth every 6 (six) hours as needed.   naproxen sodium (ALEVE) 220 MG tablet, Take 220 mg by mouth 2 (two) times daily as needed (for headaches or pain).   Current Outpatient Medications (Other):    B Complex-C (SUPER B COMPLEX PO), Take 1 tablet by mouth daily.    Cholecalciferol (VITAMIN D) 2000 units tablet, Take 2,000 Units by mouth daily.    cyclobenzaprine (FLEXERIL) 10 MG tablet, Take 1 tablet (10 mg total) by mouth at bedtime as needed (TMJ/jaw pain).   diazepam (VALIUM) 2 MG tablet, Take     1/2 to 1 tablet     2 to 3 x /day     for Acute Anxiety &  please try to limit to 5 days /week to avoid Addiction   dicyclomine (BENTYL) 20 MG tablet, Take 1 tablet 3 x /day before meals for Abdominal Discomfort, Cramping, Nausea, Bloating or Diarrhea   doxycycline (VIBRAMYCIN) 100 MG capsule, Take 1 capsule twice daily with food   latanoprost (XALATAN) 0.005 % ophthalmic solution, Place 1 drop into both eyes at bedtime.    Magnesium 250 MG TABS, Take  250 mg by mouth daily.   meclizine (ANTIVERT) 25 MG tablet, Take  1/2 to 1 tablet  2 to 3 x /da y as needed for Motion Sickness, Dizziness or Vertigo   omeprazole (PRILOSEC) 40 MG capsule, Take 1 capsule (40 mg total) by mouth 2 (two) times daily.   ondansetron (ZOFRAN ODT) 8 MG disintegrating tablet, 8mg  ODT q4 hours prn nausea   Allergies  Allergen Reactions   Meloxicam Itching   Nitrofurantoin Itching and Other (See Comments)    "Flu-like" symptoms, also   Nitrofurantoin Monohyd Macro Itching and Other (See Comments)    "Flu-like" symptoms, also   Sulfa Antibiotics Hives and Itching    Current Problems (verified) Patient Active Problem List   Diagnosis Date Noted   Decreased hearing of both ears 07/12/2020   Essential hypertension 06/12/2020   Atherosclerosis of aorta ([place]) 04/14/2018   Neuropathy 01/09/2015    Hyperlipidemia, mixed 10/29/2014   Abnormal glucose 10/29/2014   History of renal cell cancer 10/29/2014   Osteoporosis 10/29/2014   Vitamin D deficiency    GERD (gastroesophageal reflux disease)    Anxiety    Hiatal hernia     Screening Tests Immunization History  Administered Date(s) Administered   Influenza, High Dose Seasonal PF 06/22/2017, 06/14/2018, 06/19/2019, 06/12/2020   Influenza, Seasonal, Injecte, Preservative Fre 07/28/2016   Pneumococcal Conjugate-13 09/17/2015   Pneumococcal Polysaccharide-23 10/04/2012   Td 10/04/2009   Tdap 01/15/2021   Preventative care: Last colonoscopy:01/2016, Dr. Amedeo Plenty EGD 2017 HIDA 06/2017 was decreased Last mammogram: 10/2019, scheduled Last pap smear/pelvic exam: 2011  remote DEXA: 10/2019 osteopenia MRI brain 10/2014 Ct AB 34/9179 US carotids LICA <1505 [place] AB 6979 CXR 2017  Prior vaccinations: TD or Tdap: 2011 DUE will get tetanus next OV, declines today Influenza: 2020 Pneumococcal: 2014 Prevnar13: 2017 Shingles/Zostavax: declines COVID not interested at this time  Names of Other Physician/Practitioners you currently use: 1. [facility] here for primary care 2. Dr. Katy Fitch, eye doctor, last visit 2021 3. Gwyndolyn Saxon, dentist, last visit 2019, DUE Patient Care Team: Unk Pinto, MD as PCP - General (Internal Medicine) Warden Fillers, MD as Consulting Physician (Optometry) Josue Hector, MD as Consulting Physician (Cardiology) Alexis Frock, MD as Consulting Physician (Urology) Teena Irani, MD (Inactive) as Consulting Physician (Gastroenterology)  SURGICAL HISTORY She  has a past surgical history that includes Nephrectomy (Left, 1999); Left oophorectomy (1985); and Tubal ligation. FAMILY HISTORY Her family history includes Lupus in her granddaughter; Other in her brother; Pneumonia in her father. SOCIAL HISTORY She  reports that she quit smoking about 36 years ago. She has  never used smokeless tobacco. She reports current alcohol use. She reports that she does not use drugs. Wine once a week  MEDICARE WELLNESS OBJECTIVES: Physical activity:   Cardiac risk factors:   Depression/mood screen:   Depression screen [facility] 2/9 10/10/2020  Decreased Interest 0  Down, Depressed, Hopeless 0  PHQ - 2 Score 0    ADLs:  In your present state of health, do you have any difficulty performing the following activities: 10/10/2020  Hearing? N  Vision? N  Difficulty concentrating or making decisions? N  Walking or climbing stairs? N  Dressing or bathing? N  Doing errands, shopping? N  Preparing Food and eating ? N  Using the Toilet? N  In the past six months, have you accidently leaked urine? N  Do you have problems with loss of bowel control? N  Managing your Medications? N  Managing your  Finances? N  Housekeeping or managing your Housekeeping? N  Some recent data might be hidden     Cognitive Testing  Alert? Yes  Normal Appearance?Yes  Oriented to person? Yes  Place? Yes   Time? Yes  Recall of three objects?  Yes  Can perform simple calculations? Yes  Displays appropriate judgment?Yes  Can read the correct time from a watch face?Yes  EOL planning:      Review of Systems  Constitutional: Negative.  Negative for chills, fever and weight loss.  HENT: Negative.  Negative for congestion and hearing loss.   Eyes:  Negative for blurred vision, double vision, photophobia, pain, discharge and redness.  Respiratory: Negative.  Negative for cough and shortness of breath.   Cardiovascular: Negative.  Negative for chest pain, palpitations, orthopnea and leg swelling.  Gastrointestinal:  Positive for abdominal pain. Negative for blood in stool, constipation, diarrhea, heartburn, melena, nausea and vomiting.  Genitourinary:  Positive for frequency. Negative for dysuria, flank pain, hematuria and urgency.  Musculoskeletal:  Positive for neck pain. Negative for falls,  joint pain and myalgias.  Skin: Negative.  Negative for rash.  Neurological:  Negative for dizziness, tingling, tremors, sensory change, speech change, focal weakness, seizures, loss of consciousness and headaches.  Psychiatric/Behavioral:  Negative for depression, hallucinations, memory loss, substance abuse and suicidal ideas. The patient is not nervous/anxious and does not have insomnia.     Objective:     There were no vitals filed for this visit.  There is no height or weight on file to calculate BMI.  General appearance: alert, no distress, WD/WN, female HEENT: normocephalic, sclerae anicteric, TMs pearly, nares patent, no discharge or erythema, pharynx normal Oral cavity: MMM, white plaque on upper right gum, not scrapable. Neck: supple, no lymphadenopathy, no thyromegaly, no masses Heart: RRR, normal S1, S2, no murmurs Lungs: CTA bilaterally, no wheezes, rhonchi, or rales Abdomen: +bs, soft, diffuse tenderness, non distended, no masses, no hepatomegaly, no splenomegaly Musculoskeletal: nontender, no swelling, no obvious deformity, normal range of motion, without spinous process tenderness, with paraspinal muscle tenderness the left side, normal sensation, reflexes, and pulses distal. + TMJ tenderness Extremities: no edema, no cyanosis, no clubbing Pulses: 2+ symmetric, upper and lower extremities, normal cap refill Neurological: alert, oriented x 3, CN2-12 intact, strength normal upper extremities and lower extremities, sensation decreased bilateral feet, DTRs 2+ throughout, no cerebellar signs, gait normal Psychiatric: normal affect, behavior normal, pleasant    Medicare Attestation I have personally reviewed: The patient's medical and social history Their use of alcohol, tobacco or illicit drugs Their current medications and supplements The patient's functional ability including ADLs,fall risks, home safety risks, cognitive, and hearing and visual impairment Diet and  physical activities Evidence for depression or mood disorders  The patient's weight, height, BMI, and visual acuity have been recorded in the chart.  I have made referrals, counseling, and provided education to the patient based on review of the above and I have provided the patient with a written personalized care plan for preventive services.     Magda Bernheim, NP   10/08/2021

## 2021-10-13 ENCOUNTER — Ambulatory Visit: Payer: Medicare PPO | Admitting: Nurse Practitioner

## 2021-10-13 DIAGNOSIS — K2100 Gastro-esophageal reflux disease with esophagitis, without bleeding: Secondary | ICD-10-CM

## 2021-10-13 DIAGNOSIS — E782 Mixed hyperlipidemia: Secondary | ICD-10-CM

## 2021-10-13 DIAGNOSIS — Z85528 Personal history of other malignant neoplasm of kidney: Secondary | ICD-10-CM

## 2021-10-13 DIAGNOSIS — I10 Essential (primary) hypertension: Secondary | ICD-10-CM

## 2021-10-13 DIAGNOSIS — I700 Atherosclerosis of aorta: Secondary | ICD-10-CM

## 2021-10-13 DIAGNOSIS — K449 Diaphragmatic hernia without obstruction or gangrene: Secondary | ICD-10-CM

## 2021-10-13 DIAGNOSIS — G629 Polyneuropathy, unspecified: Secondary | ICD-10-CM

## 2021-10-13 DIAGNOSIS — Z0000 Encounter for general adult medical examination without abnormal findings: Secondary | ICD-10-CM

## 2021-10-13 DIAGNOSIS — M810 Age-related osteoporosis without current pathological fracture: Secondary | ICD-10-CM

## 2021-10-13 DIAGNOSIS — R413 Other amnesia: Secondary | ICD-10-CM

## 2021-10-13 DIAGNOSIS — Z79899 Other long term (current) drug therapy: Secondary | ICD-10-CM

## 2021-10-13 DIAGNOSIS — E559 Vitamin D deficiency, unspecified: Secondary | ICD-10-CM

## 2021-10-13 DIAGNOSIS — F419 Anxiety disorder, unspecified: Secondary | ICD-10-CM

## 2021-10-13 DIAGNOSIS — G8929 Other chronic pain: Secondary | ICD-10-CM

## 2021-10-13 DIAGNOSIS — R7309 Other abnormal glucose: Secondary | ICD-10-CM

## 2021-10-13 DIAGNOSIS — Z136 Encounter for screening for cardiovascular disorders: Secondary | ICD-10-CM

## 2021-10-13 DIAGNOSIS — Z1389 Encounter for screening for other disorder: Secondary | ICD-10-CM

## 2021-12-25 DIAGNOSIS — R739 Hyperglycemia, unspecified: Secondary | ICD-10-CM | POA: Diagnosis not present

## 2021-12-25 DIAGNOSIS — I10 Essential (primary) hypertension: Secondary | ICD-10-CM | POA: Diagnosis not present

## 2021-12-25 DIAGNOSIS — E559 Vitamin D deficiency, unspecified: Secondary | ICD-10-CM | POA: Diagnosis not present

## 2021-12-25 DIAGNOSIS — E785 Hyperlipidemia, unspecified: Secondary | ICD-10-CM | POA: Diagnosis not present

## 2021-12-25 DIAGNOSIS — E041 Nontoxic single thyroid nodule: Secondary | ICD-10-CM | POA: Diagnosis not present

## 2022-01-01 DIAGNOSIS — Z85528 Personal history of other malignant neoplasm of kidney: Secondary | ICD-10-CM | POA: Diagnosis not present

## 2022-01-01 DIAGNOSIS — I700 Atherosclerosis of aorta: Secondary | ICD-10-CM | POA: Diagnosis not present

## 2022-01-01 DIAGNOSIS — F411 Generalized anxiety disorder: Secondary | ICD-10-CM | POA: Diagnosis not present

## 2022-01-01 DIAGNOSIS — R413 Other amnesia: Secondary | ICD-10-CM | POA: Diagnosis not present

## 2022-01-01 DIAGNOSIS — G629 Polyneuropathy, unspecified: Secondary | ICD-10-CM | POA: Diagnosis not present

## 2022-01-01 DIAGNOSIS — I10 Essential (primary) hypertension: Secondary | ICD-10-CM | POA: Diagnosis not present

## 2022-01-01 DIAGNOSIS — Z0000 Encounter for general adult medical examination without abnormal findings: Secondary | ICD-10-CM | POA: Diagnosis not present

## 2022-01-01 DIAGNOSIS — E785 Hyperlipidemia, unspecified: Secondary | ICD-10-CM | POA: Diagnosis not present

## 2022-01-01 DIAGNOSIS — R739 Hyperglycemia, unspecified: Secondary | ICD-10-CM | POA: Diagnosis not present

## 2022-01-05 DIAGNOSIS — R82998 Other abnormal findings in urine: Secondary | ICD-10-CM | POA: Diagnosis not present

## 2022-01-23 DIAGNOSIS — H04123 Dry eye syndrome of bilateral lacrimal glands: Secondary | ICD-10-CM | POA: Diagnosis not present

## 2022-01-23 DIAGNOSIS — H532 Diplopia: Secondary | ICD-10-CM | POA: Diagnosis not present

## 2022-01-23 DIAGNOSIS — Z961 Presence of intraocular lens: Secondary | ICD-10-CM | POA: Diagnosis not present

## 2022-01-23 DIAGNOSIS — H5021 Vertical strabismus, right eye: Secondary | ICD-10-CM | POA: Diagnosis not present

## 2022-01-23 DIAGNOSIS — H401131 Primary open-angle glaucoma, bilateral, mild stage: Secondary | ICD-10-CM | POA: Diagnosis not present

## 2022-01-23 DIAGNOSIS — H35371 Puckering of macula, right eye: Secondary | ICD-10-CM | POA: Diagnosis not present

## 2022-02-03 DIAGNOSIS — M8589 Other specified disorders of bone density and structure, multiple sites: Secondary | ICD-10-CM | POA: Diagnosis not present

## 2022-02-13 ENCOUNTER — Ambulatory Visit
Admission: RE | Admit: 2022-02-13 | Discharge: 2022-02-13 | Disposition: A | Discharge status: Home / Self Care | Payer: Medicare PPO | Source: Ambulatory Visit | Attending: Internal Medicine | Admitting: Internal Medicine

## 2022-02-13 DIAGNOSIS — Z1231 Encounter for screening mammogram for malignant neoplasm of breast: Secondary | ICD-10-CM | POA: Diagnosis not present

## 2022-02-13 DIAGNOSIS — R5381 Other malaise: Secondary | ICD-10-CM

## 2022-02-16 DIAGNOSIS — R0981 Nasal congestion: Secondary | ICD-10-CM | POA: Diagnosis not present

## 2022-02-16 DIAGNOSIS — I10 Essential (primary) hypertension: Secondary | ICD-10-CM | POA: Diagnosis not present

## 2022-02-16 DIAGNOSIS — R1013 Epigastric pain: Secondary | ICD-10-CM | POA: Diagnosis not present

## 2022-02-16 DIAGNOSIS — J310 Chronic rhinitis: Secondary | ICD-10-CM | POA: Diagnosis not present

## 2022-02-16 DIAGNOSIS — L299 Pruritus, unspecified: Secondary | ICD-10-CM | POA: Diagnosis not present

## 2022-02-16 DIAGNOSIS — J029 Acute pharyngitis, unspecified: Secondary | ICD-10-CM | POA: Diagnosis not present

## 2022-02-16 DIAGNOSIS — R5383 Other fatigue: Secondary | ICD-10-CM | POA: Diagnosis not present

## 2022-02-16 DIAGNOSIS — K2900 Acute gastritis without bleeding: Secondary | ICD-10-CM | POA: Diagnosis not present

## 2022-02-16 DIAGNOSIS — F411 Generalized anxiety disorder: Secondary | ICD-10-CM | POA: Diagnosis not present

## 2022-03-07 DIAGNOSIS — L259 Unspecified contact dermatitis, unspecified cause: Secondary | ICD-10-CM | POA: Diagnosis not present

## 2022-07-07 DIAGNOSIS — R399 Unspecified symptoms and signs involving the genitourinary system: Secondary | ICD-10-CM | POA: Diagnosis not present

## 2022-07-28 IMAGING — CR DG ABDOMEN 1V
1 series · 1 of 1 positions shown · non-contrast
Comparison: CT abdomen and pelvis July 25, 2019

CLINICAL DATA: Right flank pain with nausea and vomiting

EXAM:
ABDOMEN - 1 VIEW

[t abdomen supine]
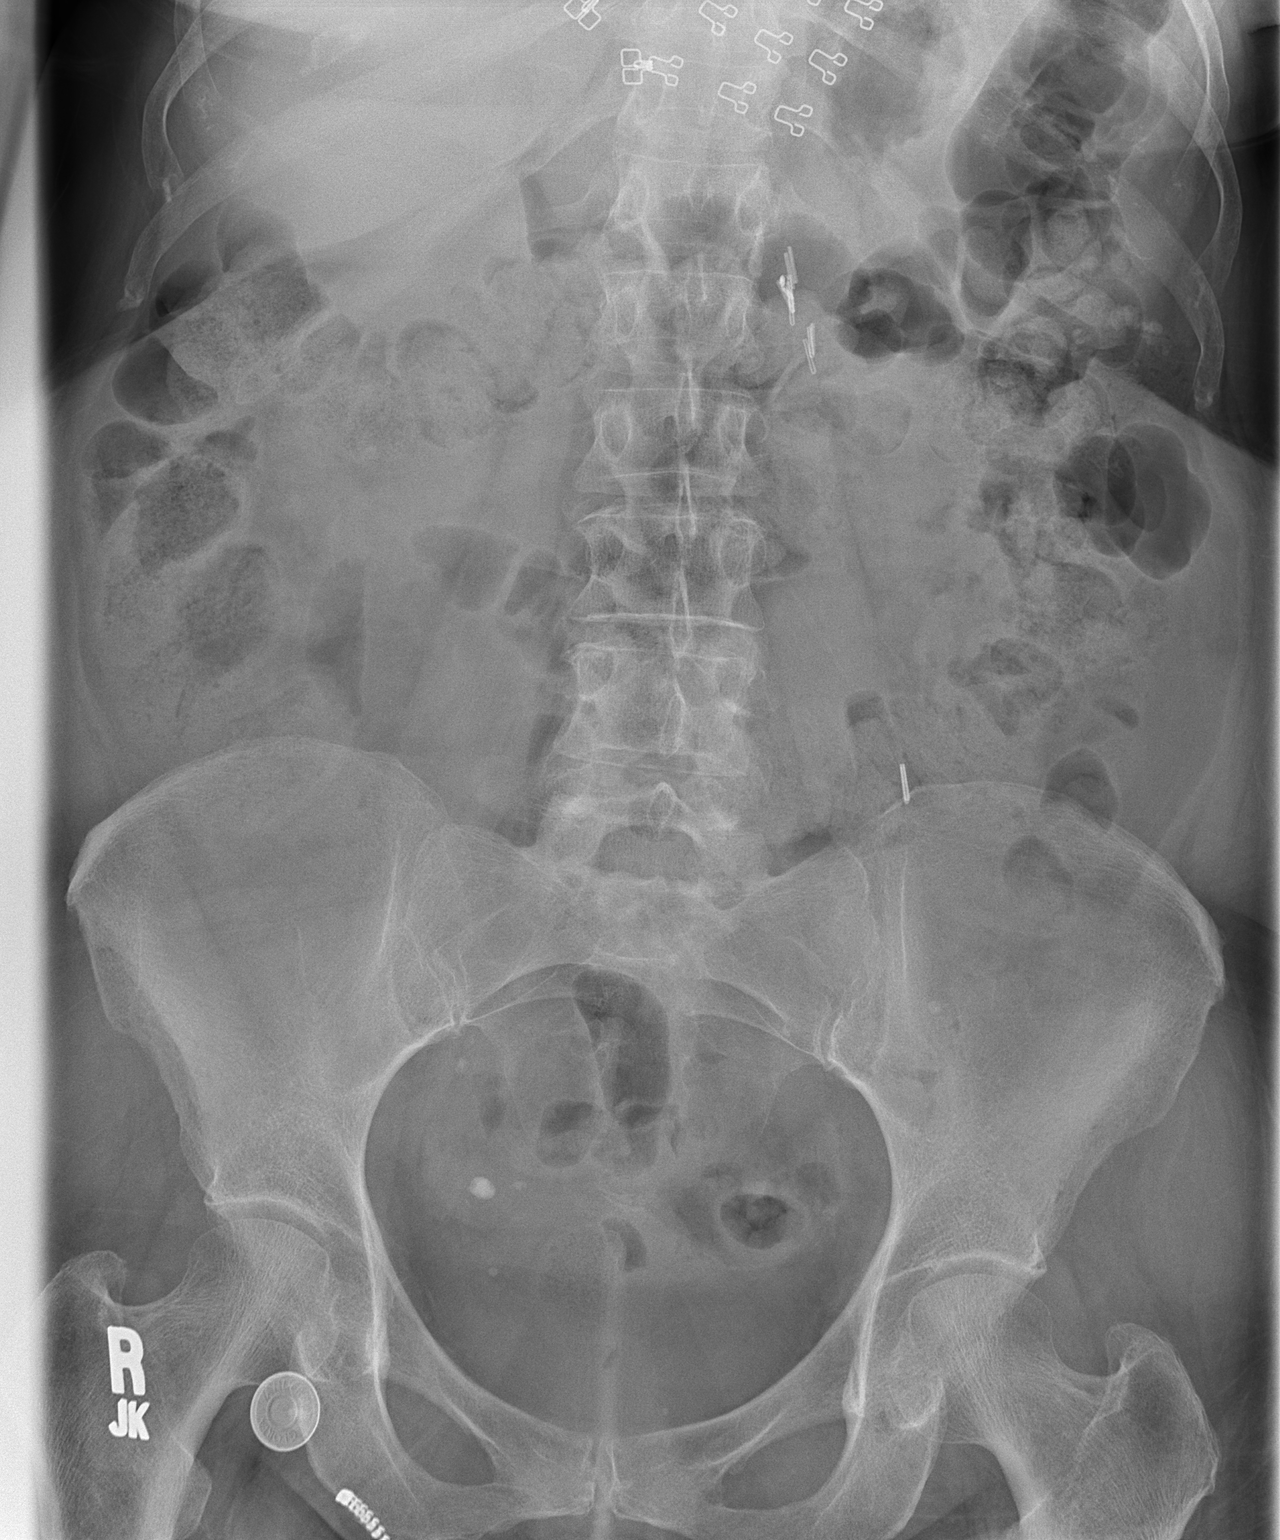

[1 of 1 positions shown; findings below may reference images not displayed]

FINDINGS: Surgical clips are noted on the left, unchanged. There is moderate
stool throughout the colon. There is no bowel dilatation or
air-fluid level to suggest bowel obstruction. No free air. There are
apparent phleboliths in the pelvis. No other abnormal calcifications
evident.
IMPRESSION: Postoperative change on the left. No bowel obstruction or free air.
Moderate stool throughout colon. Apparent phleboliths right pelvis.

## 2022-09-30 DIAGNOSIS — R350 Frequency of micturition: Secondary | ICD-10-CM | POA: Diagnosis not present

## 2022-09-30 DIAGNOSIS — R102 Pelvic and perineal pain: Secondary | ICD-10-CM | POA: Diagnosis not present

## 2022-09-30 DIAGNOSIS — N393 Stress incontinence (female) (male): Secondary | ICD-10-CM | POA: Diagnosis not present

## 2022-09-30 DIAGNOSIS — Z85528 Personal history of other malignant neoplasm of kidney: Secondary | ICD-10-CM | POA: Diagnosis not present

## 2022-11-11 DIAGNOSIS — M545 Low back pain, unspecified: Secondary | ICD-10-CM | POA: Diagnosis not present

## 2022-11-13 ENCOUNTER — Other Ambulatory Visit: Payer: Self-pay

## 2022-11-13 ENCOUNTER — Emergency Department (HOSPITAL_COMMUNITY): Payer: Medicare PPO

## 2022-11-13 ENCOUNTER — Emergency Department (HOSPITAL_COMMUNITY)
Admission: EM | Admit: 2022-11-13 | Discharge: 2022-11-13 | Disposition: A | Discharge status: Home / Self Care | Payer: Medicare PPO | Attending: Emergency Medicine | Admitting: Emergency Medicine

## 2022-11-13 DIAGNOSIS — R1031 Right lower quadrant pain: Secondary | ICD-10-CM | POA: Insufficient documentation

## 2022-11-13 DIAGNOSIS — R300 Dysuria: Secondary | ICD-10-CM

## 2022-11-13 DIAGNOSIS — N200 Calculus of kidney: Secondary | ICD-10-CM | POA: Diagnosis not present

## 2022-11-13 DIAGNOSIS — K5730 Diverticulosis of large intestine without perforation or abscess without bleeding: Secondary | ICD-10-CM | POA: Diagnosis not present

## 2022-11-13 DIAGNOSIS — R109 Unspecified abdominal pain: Secondary | ICD-10-CM

## 2022-11-13 DIAGNOSIS — R1084 Generalized abdominal pain: Secondary | ICD-10-CM | POA: Diagnosis present

## 2022-11-13 LAB — URINALYSIS, ROUTINE W REFLEX MICROSCOPIC
Bacteria, UA: NONE SEEN
Bilirubin Urine: NEGATIVE
Glucose, UA: NEGATIVE mg/dL
Hgb urine dipstick: NEGATIVE
Ketones, ur: NEGATIVE mg/dL
Nitrite: NEGATIVE
Protein, ur: NEGATIVE mg/dL
Specific Gravity, Urine: 1.005 (ref 1.005–1.030)
pH: 7 (ref 5.0–8.0)

## 2022-11-13 LAB — COMPREHENSIVE METABOLIC PANEL
ALT: 9 U/L (ref 0–44)
AST: 22 U/L (ref 15–41)
Albumin: 4.1 g/dL (ref 3.5–5.0)
Alkaline Phosphatase: 53 U/L (ref 38–126)
Anion gap: 7 (ref 5–15)
BUN: 16 mg/dL (ref 8–23)
CO2: 25 mmol/L (ref 22–32)
Calcium: 8.9 mg/dL (ref 8.9–10.3)
Chloride: 104 mmol/L (ref 98–111)
Creatinine, Ser: 0.74 mg/dL (ref 0.44–1.00)
GFR, Estimated: >60 mL/min (ref >60)
Glucose, Bld: 107 mg/dL — ABNORMAL HIGH (ref 70–99)
Potassium: 3.7 mmol/L (ref 3.5–5.1)
Sodium: 136 mmol/L (ref 135–145)
Total Bilirubin: 0.6 mg/dL (ref 0.3–1.2)
Total Protein: 6.9 g/dL (ref 6.5–8.1)

## 2022-11-13 LAB — CBC WITH DIFFERENTIAL/PLATELET
Abs Immature Granulocytes: 0.01 10*3/uL (ref 0.00–0.07)
Basophils Absolute: 0 10*3/uL (ref 0.0–0.1)
Basophils Relative: 1 %
Eosinophils Absolute: 0.1 10*3/uL (ref 0.0–0.5)
Eosinophils Relative: 2 %
HCT: 41.3 % (ref 36.0–46.0)
Hemoglobin: 12.9 g/dL (ref 12.0–15.0)
Immature Granulocytes: 0 %
Lymphocytes Relative: 27 %
Lymphs Abs: 1.6 10*3/uL (ref 0.7–4.0)
MCH: 26.8 pg (ref 26.0–34.0)
MCHC: 31.2 g/dL (ref 30.0–36.0)
MCV: 85.7 fL (ref 80.0–100.0)
Monocytes Absolute: 0.4 10*3/uL (ref 0.1–1.0)
Monocytes Relative: 7 %
Neutro Abs: 3.8 10*3/uL (ref 1.7–7.7)
Neutrophils Relative %: 63 %
Platelets: 244 10*3/uL (ref 150–400)
RBC: 4.82 MIL/uL (ref 3.87–5.11)
RDW: 13.8 % (ref 11.5–15.5)
WBC: 5.9 10*3/uL (ref 4.0–10.5)
nRBC: 0 % (ref 0.0–0.2)

## 2022-11-13 LAB — LIPASE, BLOOD: Lipase: 34 U/L (ref 11–51)

## 2022-11-13 MED ORDER — ONDANSETRON 8 MG PO TBDP: ORAL_TABLET | ORAL | 0 refills | Status: AC

## 2022-11-13 MED ORDER — KETOROLAC TROMETHAMINE 30 MG/ML IJ SOLN
15.0000 mg | Freq: Once | INTRAMUSCULAR | Status: AC
  Administered 2022-11-13: 15 mg via INTRAVENOUS
  Filled 2022-11-13: qty 1

## 2022-11-13 MED ORDER — ONDANSETRON HCL 4 MG/2ML IJ SOLN
4.0000 mg | Freq: Once | INTRAMUSCULAR | Status: AC
  Administered 2022-11-13: 4 mg via INTRAVENOUS
  Filled 2022-11-13: qty 2

## 2022-11-13 MED ORDER — SODIUM CHLORIDE 0.9 % IV BOLUS
1000.0000 mL | Freq: Once | INTRAVENOUS | Status: AC
  Administered 2022-11-13: 1000 mL via INTRAVENOUS

## 2022-11-13 MED ORDER — SODIUM CHLORIDE 0.9 % IV SOLN: INTRAVENOUS | Status: DC

## 2022-11-13 MED ORDER — FENTANYL CITRATE PF 50 MCG/ML IJ SOSY
50.0000 ug | PREFILLED_SYRINGE | Freq: Once | INTRAMUSCULAR | Status: AC
  Administered 2022-11-13: 50 ug via INTRAVENOUS
  Filled 2022-11-13: qty 1

## 2022-11-13 NOTE — ED Triage Notes (Signed)
Pt reports right flank pain x few days. Pt was seen at [facility] 3/19 and was prescribed a muscle relaxer that "did not help"

## 2022-11-13 NOTE — Discharge Instructions (Signed)
As discussed, today's evaluation has been generally reassuring.  With the presence of 1 kidney stone demonstrated on CT scan, there is some suspicion that he passed another 1 recently.  Please stay well-hydrated, take all medication as discussed, including Aleve, twice daily.  Return here for concerning changes in your condition, otherwise follow-up with your urologist next week.

## 2022-11-13 NOTE — ED Notes (Signed)
Pt d/c with instructions and prescriptions. Verbalized understanding. NAD. All questions answered, stable condition.

## 2022-11-13 NOTE — ED Provider Notes (Signed)
[place] EMERGENCY DEPARTMENT AT [facility] Provider Note   CSN: [id number] Arrival date & time: 11/13/22  [id number]     History  Chief Complaint  Patient presents with   Flank Pain    Kimberly Wilcox is a 77 y.o. female.  HPI Patient presents with her daughter who assists with the history.  Daughter states that she is generally well, was still until 3 days ago.  At that time she developed right lateral abdominal and flank pain.  She saw urgent care, was provided a muscle relaxer, had urinalysis that she reports was unremarkable.  Since that time pain has increased, and today she had episode of profound nausea, generalized discomfort, and worsening pain.  No other abdominal pain, no actual vomiting, no diarrhea, dysuria, hematuria.     Home Medications Prior to Admission medications   Medication Sig Start Date End Date Taking? Authorizing Provider  acetaminophen (TYLENOL) 325 MG tablet Take 650 mg by mouth every 6 (six) hours as needed.    [provider]  B Complex-C (SUPER B COMPLEX PO) Take 1 tablet by mouth daily.     [provider]  Cholecalciferol (VITAMIN D) 2000 units tablet Take 2,000 Units by mouth daily.     [provider]  cyclobenzaprine (FLEXERIL) 10 MG tablet Take 1 tablet (10 mg total) by mouth at bedtime as needed (TMJ/jaw pain). 11/03/19   Vladimir Crofts, PA-C  diazepam (VALIUM) 2 MG tablet Take     1/2 to 1 tablet     2 to 3 x /day     for Acute Anxiety &  please try to limit to 5 days /week to avoid Addiction 06/12/20   Unk Pinto, MD  dicyclomine (BENTYL) 20 MG tablet Take 1 tablet 3 x /day before meals for Abdominal Discomfort, Cramping, Nausea, Bloating or Diarrhea 09/25/19   Unk Pinto, MD  doxycycline (VIBRAMYCIN) 100 MG capsule Take 1 capsule twice daily with food 01/15/21   Liane Comber, NP  fexofenadine (ALLEGRA) 180 MG tablet Take 1 tablet (180 mg total) by mouth daily. 08/29/20   Liane Comber, NP  fluticasone (FLONASE) 50 MCG/ACT nasal spray Place 2 sprays into both nostrils at bedtime. 08/01/19   McClanahan, Danton Sewer, NP  latanoprost (XALATAN) 0.005 % ophthalmic solution Place 1 drop into both eyes at bedtime.  11/07/14   [provider]  Magnesium 250 MG TABS Take 250 mg by mouth daily.    [provider]  meclizine (ANTIVERT) 25 MG tablet Take  1/2 to 1 tablet  2 to 3 x /da y as needed for Motion Sickness, Dizziness or Vertigo 02/10/21   Unk Pinto, MD  naproxen sodium (ALEVE) 220 MG tablet Take 220 mg by mouth 2 (two) times daily as needed (for headaches or pain).    [provider]  nitroGLYCERIN (NITROSTAT) 0.4 MG SL tablet Place 1 tablet (0.4 mg total) under the tongue every 5 (five) minutes as needed for chest pain. 06/14/18 11/21/24  Liane Comber, NP  omeprazole (PRILOSEC) 40 MG capsule Take 1 capsule (40 mg total) by mouth 2 (two) times daily. 08/16/19   Levin Erp, PA  ondansetron (ZOFRAN ODT) 8 MG disintegrating tablet 8mg  ODT q4 hours prn nausea 11/13/22   Carmin Muskrat, MD  PROAIR HFA 108 [phone] Base) MCG/ACT inhaler INHALE 2 PUFFS INTO THE LUNGS EVERY 4 HOURS AS NEEDED FOR WHEEZING OR SHORTNESS OF BREATH 11/23/18   Vladimir Crofts, PA-C      Allergies  Meloxicam, Nitrofurantoin, Nitrofurantoin monohyd macro, and Sulfa antibiotics    Review of Systems   Review of Systems  All other systems reviewed and are negative.   Physical Exam Updated Vital Signs BP 139/65 (BP Location: Right Arm)   Pulse 66   Temp 97.9 F (36.6 C) (Oral)   Resp 18   Ht 5\' 1"  (1.549 m)   Wt 64.9 kg   SpO2 99%   BMI 27.02 kg/m  Physical Exam Vitals and nursing note reviewed.  Constitutional:      General: She is not in acute distress.    Appearance: She is well-developed.  HENT:     Head: Normocephalic and atraumatic.  Eyes:     Conjunctiva/sclera: Conjunctivae normal.  Cardiovascular:     Rate and Rhythm: Normal rate and regular  rhythm.  Pulmonary:     Effort: Pulmonary effort is normal. No respiratory distress.     Breath sounds: Normal breath sounds. No stridor.  Abdominal:     General: There is no distension.     Tenderness: There is abdominal tenderness.     Comments: Right lower quadrant right lateral abdominal tenderness  Skin:    General: Skin is warm and dry.  Neurological:     Mental Status: She is alert and oriented to person, place, and time.     Cranial Nerves: No cranial nerve deficit.  Psychiatric:        Mood and Affect: Mood normal.     ED Results / Procedures / Treatments   Labs (all labs ordered are listed, but only abnormal results are displayed) Labs Reviewed  COMPREHENSIVE METABOLIC PANEL - Abnormal; Notable for the following components:      Result Value   Glucose, Bld 107 (*)    All other components within normal limits  URINALYSIS, ROUTINE W REFLEX MICROSCOPIC - Abnormal; Notable for the following components:   Color, Urine STRAW (*)    Leukocytes,Ua TRACE (*)    All other components within normal limits  LIPASE, BLOOD  CBC WITH DIFFERENTIAL/PLATELET    EKG None  Radiology CT ABDOMEN PELVIS WO CONTRAST  Result Date: 11/13/2022 CLINICAL DATA:  Abdominal pain, acute, nonlocalized. Right flank pain. EXAM: CT ABDOMEN AND PELVIS WITHOUT CONTRAST TECHNIQUE: Multidetector CT imaging of the abdomen and pelvis was performed following the standard protocol without IV contrast. RADIATION DOSE REDUCTION: This exam was performed according to the departmental dose-optimization program which includes automated exposure control, adjustment of the mA and/or kV according to patient size and/or use of iterative reconstruction technique. COMPARISON:  CT abdomen/pelvis 07/25/2019. FINDINGS: Lower chest: Unchanged 7 mm nodule in the lateral aspect of the left lower lobe (image 44 series 4). No acute findings. Hepatobiliary: No focal liver abnormality is seen. Status post cholecystectomy. Common bile  duct measures 12 mm. No intrahepatic biliary dilatation. Pancreas: Unremarkable. No pancreatic ductal dilatation or surrounding inflammatory changes. Spleen: Normal in size without focal abnormality. Adrenals/Urinary Tract: The adrenal glands are unremarkable. Prior left nephrectomy. Calyceal calculus in the lower pole right kidney measuring up to 5 mm (coronal image 55 series 5). No hydronephrosis. Bladder is unremarkable for degree of distention. Stomach/Bowel: Normal stomach and duodenum. No dilated loops of small bowel. Normal appendix is visualized on axial image 60 series 2. Mild sigmoid diverticulosis without bowel wall thickening or surrounding inflammation to suggest acute diverticulitis. Vascular/Lymphatic: Aortic atherosclerosis. No enlarged abdominal or pelvic lymph nodes. Reproductive: Uterus and bilateral adnexa are unremarkable. Other: No abdominal wall hernia or abnormality. No abdominopelvic ascites.  Musculoskeletal: No acute or significant osseous findings. IMPRESSION: 1. No acute abnormality in the abdomen or pelvis. 2. Nonobstructing right renal calculus measuring up to 5 mm. No hydronephrosis. 3. Mild sigmoid diverticulosis without evidence of acute diverticulitis. 4. Unchanged 7 mm nodule in the lateral aspect of the left lower lobe. 5. Aortic Atherosclerosis (ICD10-I70.0). Electronically Signed   By: Emmit Alexanders M.D.   On: 11/13/2022 09:00    Procedures Procedures    Medications Ordered in ED Medications  sodium chloride 0.9 % bolus 1,000 mL (1,000 mLs Intravenous New Bag/Given 11/13/22 0818)    And  0.9 %  sodium chloride infusion (has no administration in time range)  fentaNYL (SUBLIMAZE) injection 50 mcg (50 mcg Intravenous Given 11/13/22 0817)  ondansetron (ZOFRAN) injection 4 mg (4 mg Intravenous Given 11/13/22 0817)  ketorolac (TORADOL) 30 MG/ML injection 15 mg (15 mg Intravenous Given 11/13/22 1001)    ED Course/ Medical Decision Making/ A&P                              Medical Decision Making Elderly female with multiple medical problems including renal cell malignancy, now with 1 kidney presents with flank pain.  Patient is awake, alert, hypertensive otherwise hemodynamically unremarkable.  Differential includes return of disease, pyelonephritis, kidney stone, other intra-abdominal pathology. Patient received fentanyl, Zofran, fluids, CT, labs monitoring. Cardiac 95 sinus normal Pulse ox 100% room air normal   Amount and/or Complexity of Data Reviewed Independent Historian: caregiver    Details: Daughter, HPI External Data Reviewed: notes.    Details: History of renal cell malignancy Labs: ordered. Decision-making details documented in ED Course. Radiology: ordered and independent interpretation performed. Decision-making details documented in ED Course.  Risk Prescription drug management.  Repeat exam patient continues to have some discomfort.  We discussed her medication regimen, and she can take Aleve, has been doing so, though inconsistently.  Toradol ordered. 11:19 AM Patient now calm, resolution of pain has occurred.  We discussed findings again, concerning for possible passage of kidney stone with 1 remaining in the right kidney.  Patient has urology follow-up scheduled in 5 days, and without fever, ongoing pain, low suspicion for obstruction, no evidence for infection, no negation for hospitalization, though the patient and I had a lengthy conversation about home meds, resuscitation with fluids orally, and appropriate follow-up care.        Final Clinical Impression(s) / ED Diagnoses Final diagnoses:  Flank pain    Rx / DC Orders ED Discharge Orders          Ordered    ondansetron (ZOFRAN ODT) 8 MG disintegrating tablet        11/13/22 1118              Carmin Muskrat, MD 11/13/22 1119

## 2022-11-13 NOTE — ED Notes (Signed)
Patient transported to CT 

## 2022-11-18 DIAGNOSIS — R102 Pelvic and perineal pain: Secondary | ICD-10-CM | POA: Diagnosis not present

## 2022-11-18 DIAGNOSIS — N200 Calculus of kidney: Secondary | ICD-10-CM | POA: Diagnosis not present

## 2022-11-18 DIAGNOSIS — R1084 Generalized abdominal pain: Secondary | ICD-10-CM | POA: Diagnosis not present

## 2022-11-24 DIAGNOSIS — I700 Atherosclerosis of aorta: Secondary | ICD-10-CM | POA: Diagnosis not present

## 2022-11-24 DIAGNOSIS — I10 Essential (primary) hypertension: Secondary | ICD-10-CM | POA: Diagnosis not present

## 2022-11-24 DIAGNOSIS — C642 Malignant neoplasm of left kidney, except renal pelvis: Secondary | ICD-10-CM | POA: Diagnosis not present

## 2022-11-24 DIAGNOSIS — L299 Pruritus, unspecified: Secondary | ICD-10-CM | POA: Diagnosis not present

## 2022-11-24 DIAGNOSIS — N200 Calculus of kidney: Secondary | ICD-10-CM | POA: Diagnosis not present

## 2022-11-24 DIAGNOSIS — Z85528 Personal history of other malignant neoplasm of kidney: Secondary | ICD-10-CM | POA: Diagnosis not present

## 2022-12-24 DIAGNOSIS — J0110 Acute frontal sinusitis, unspecified: Secondary | ICD-10-CM | POA: Diagnosis not present

## 2022-12-29 DIAGNOSIS — K219 Gastro-esophageal reflux disease without esophagitis: Secondary | ICD-10-CM | POA: Diagnosis not present

## 2022-12-29 DIAGNOSIS — I10 Essential (primary) hypertension: Secondary | ICD-10-CM | POA: Diagnosis not present

## 2022-12-29 DIAGNOSIS — E559 Vitamin D deficiency, unspecified: Secondary | ICD-10-CM | POA: Diagnosis not present

## 2022-12-29 DIAGNOSIS — E041 Nontoxic single thyroid nodule: Secondary | ICD-10-CM | POA: Diagnosis not present

## 2022-12-29 DIAGNOSIS — E785 Hyperlipidemia, unspecified: Secondary | ICD-10-CM | POA: Diagnosis not present

## 2022-12-29 DIAGNOSIS — R739 Hyperglycemia, unspecified: Secondary | ICD-10-CM | POA: Diagnosis not present

## 2022-12-29 DIAGNOSIS — Z1212 Encounter for screening for malignant neoplasm of rectum: Secondary | ICD-10-CM | POA: Diagnosis not present

## 2023-01-05 DIAGNOSIS — R82998 Other abnormal findings in urine: Secondary | ICD-10-CM | POA: Diagnosis not present

## 2023-01-05 DIAGNOSIS — E785 Hyperlipidemia, unspecified: Secondary | ICD-10-CM | POA: Diagnosis not present

## 2023-01-05 DIAGNOSIS — G629 Polyneuropathy, unspecified: Secondary | ICD-10-CM | POA: Diagnosis not present

## 2023-01-05 DIAGNOSIS — F411 Generalized anxiety disorder: Secondary | ICD-10-CM | POA: Diagnosis not present

## 2023-01-05 DIAGNOSIS — K449 Diaphragmatic hernia without obstruction or gangrene: Secondary | ICD-10-CM | POA: Diagnosis not present

## 2023-01-05 DIAGNOSIS — I10 Essential (primary) hypertension: Secondary | ICD-10-CM | POA: Diagnosis not present

## 2023-01-05 DIAGNOSIS — Z0000 Encounter for general adult medical examination without abnormal findings: Secondary | ICD-10-CM | POA: Diagnosis not present

## 2023-01-05 DIAGNOSIS — M810 Age-related osteoporosis without current pathological fracture: Secondary | ICD-10-CM | POA: Diagnosis not present

## 2023-01-05 DIAGNOSIS — I700 Atherosclerosis of aorta: Secondary | ICD-10-CM | POA: Diagnosis not present

## 2023-01-05 DIAGNOSIS — R739 Hyperglycemia, unspecified: Secondary | ICD-10-CM | POA: Diagnosis not present

## 2023-02-02 ENCOUNTER — Other Ambulatory Visit: Payer: Self-pay | Admitting: Internal Medicine

## 2023-02-02 DIAGNOSIS — Z1231 Encounter for screening mammogram for malignant neoplasm of breast: Secondary | ICD-10-CM

## 2023-02-08 ENCOUNTER — Inpatient Hospital Stay: Admission: RE | Admit: 2023-02-08 | Payer: Medicare PPO | Source: Ambulatory Visit

## 2023-02-22 DIAGNOSIS — M25561 Pain in right knee: Secondary | ICD-10-CM | POA: Diagnosis not present

## 2023-02-22 DIAGNOSIS — M25551 Pain in right hip: Secondary | ICD-10-CM | POA: Diagnosis not present

## 2023-03-15 DIAGNOSIS — M25551 Pain in right hip: Secondary | ICD-10-CM | POA: Diagnosis not present

## 2023-03-15 DIAGNOSIS — M25561 Pain in right knee: Secondary | ICD-10-CM | POA: Diagnosis not present

## 2023-04-02 DIAGNOSIS — K581 Irritable bowel syndrome with constipation: Secondary | ICD-10-CM | POA: Diagnosis not present

## 2023-04-02 DIAGNOSIS — N200 Calculus of kidney: Secondary | ICD-10-CM | POA: Diagnosis not present

## 2023-04-02 DIAGNOSIS — C642 Malignant neoplasm of left kidney, except renal pelvis: Secondary | ICD-10-CM | POA: Diagnosis not present

## 2023-04-02 DIAGNOSIS — R102 Pelvic and perineal pain: Secondary | ICD-10-CM | POA: Diagnosis not present

## 2023-04-02 DIAGNOSIS — R1903 Right lower quadrant abdominal swelling, mass and lump: Secondary | ICD-10-CM | POA: Diagnosis not present

## 2023-04-02 DIAGNOSIS — E559 Vitamin D deficiency, unspecified: Secondary | ICD-10-CM | POA: Diagnosis not present

## 2023-04-02 DIAGNOSIS — R1031 Right lower quadrant pain: Secondary | ICD-10-CM | POA: Diagnosis not present

## 2023-04-02 DIAGNOSIS — K219 Gastro-esophageal reflux disease without esophagitis: Secondary | ICD-10-CM | POA: Diagnosis not present

## 2023-04-12 ENCOUNTER — Ambulatory Visit: Payer: Medicare PPO

## 2023-04-12 ENCOUNTER — Ambulatory Visit: Admission: RE | Admit: 2023-04-12 | Payer: Medicare PPO | Source: Ambulatory Visit

## 2023-04-12 DIAGNOSIS — Z1231 Encounter for screening mammogram for malignant neoplasm of breast: Secondary | ICD-10-CM | POA: Diagnosis not present

## 2023-04-14 DIAGNOSIS — R351 Nocturia: Secondary | ICD-10-CM | POA: Diagnosis not present

## 2023-04-14 DIAGNOSIS — R102 Pelvic and perineal pain: Secondary | ICD-10-CM | POA: Diagnosis not present

## 2023-04-14 DIAGNOSIS — R1084 Generalized abdominal pain: Secondary | ICD-10-CM | POA: Diagnosis not present

## 2023-04-14 DIAGNOSIS — N200 Calculus of kidney: Secondary | ICD-10-CM | POA: Diagnosis not present

## 2023-05-27 DIAGNOSIS — L308 Other specified dermatitis: Secondary | ICD-10-CM | POA: Diagnosis not present

## 2023-05-27 DIAGNOSIS — L2989 Other pruritus: Secondary | ICD-10-CM | POA: Diagnosis not present

## 2023-08-19 ENCOUNTER — Other Ambulatory Visit (HOSPITAL_COMMUNITY): Payer: Self-pay | Admitting: Registered Nurse

## 2023-08-19 ENCOUNTER — Ambulatory Visit (HOSPITAL_COMMUNITY)
Admission: RE | Admit: 2023-08-19 | Discharge: 2023-08-19 | Disposition: A | Discharge status: Home / Self Care | Payer: Medicare PPO | Source: Ambulatory Visit | Attending: Registered Nurse | Admitting: Registered Nurse

## 2023-08-19 DIAGNOSIS — M79605 Pain in left leg: Secondary | ICD-10-CM

## 2023-08-19 DIAGNOSIS — M79604 Pain in right leg: Secondary | ICD-10-CM | POA: Diagnosis not present

## 2023-08-19 DIAGNOSIS — R600 Localized edema: Secondary | ICD-10-CM | POA: Diagnosis not present

## 2023-08-19 DIAGNOSIS — I10 Essential (primary) hypertension: Secondary | ICD-10-CM | POA: Diagnosis not present

## 2023-08-19 DIAGNOSIS — M7121 Synovial cyst of popliteal space [Baker], right knee: Secondary | ICD-10-CM | POA: Diagnosis not present

## 2023-08-19 NOTE — Progress Notes (Signed)
Lower extremity venous duplex completed. Please see CV Procedures for preliminary results.  Shona Simpson, RVT 08/19/23 2:01 PM

## 2023-09-28 DIAGNOSIS — M25561 Pain in right knee: Secondary | ICD-10-CM | POA: Diagnosis not present

## 2023-09-30 DIAGNOSIS — M542 Cervicalgia: Secondary | ICD-10-CM | POA: Diagnosis not present

## 2023-10-10 DIAGNOSIS — R3915 Urgency of urination: Secondary | ICD-10-CM | POA: Diagnosis not present

## 2023-10-10 DIAGNOSIS — R300 Dysuria: Secondary | ICD-10-CM | POA: Diagnosis not present

## 2023-10-10 DIAGNOSIS — R319 Hematuria, unspecified: Secondary | ICD-10-CM | POA: Diagnosis not present

## 2023-10-20 DIAGNOSIS — M25561 Pain in right knee: Secondary | ICD-10-CM | POA: Diagnosis not present

## 2023-10-29 DIAGNOSIS — M170 Bilateral primary osteoarthritis of knee: Secondary | ICD-10-CM | POA: Diagnosis not present

## 2023-12-09 DIAGNOSIS — M25561 Pain in right knee: Secondary | ICD-10-CM | POA: Diagnosis not present

## 2023-12-09 DIAGNOSIS — M1711 Unilateral primary osteoarthritis, right knee: Secondary | ICD-10-CM | POA: Diagnosis not present

## 2023-12-16 DIAGNOSIS — M1711 Unilateral primary osteoarthritis, right knee: Secondary | ICD-10-CM | POA: Diagnosis not present

## 2023-12-23 DIAGNOSIS — M1711 Unilateral primary osteoarthritis, right knee: Secondary | ICD-10-CM | POA: Diagnosis not present

## 2024-01-04 DIAGNOSIS — E785 Hyperlipidemia, unspecified: Secondary | ICD-10-CM | POA: Diagnosis not present

## 2024-01-04 DIAGNOSIS — E041 Nontoxic single thyroid nodule: Secondary | ICD-10-CM | POA: Diagnosis not present

## 2024-01-04 DIAGNOSIS — I10 Essential (primary) hypertension: Secondary | ICD-10-CM | POA: Diagnosis not present

## 2024-01-04 DIAGNOSIS — E559 Vitamin D deficiency, unspecified: Secondary | ICD-10-CM | POA: Diagnosis not present

## 2024-01-04 DIAGNOSIS — R739 Hyperglycemia, unspecified: Secondary | ICD-10-CM | POA: Diagnosis not present

## 2024-01-11 DIAGNOSIS — E785 Hyperlipidemia, unspecified: Secondary | ICD-10-CM | POA: Diagnosis not present

## 2024-01-11 DIAGNOSIS — G629 Polyneuropathy, unspecified: Secondary | ICD-10-CM | POA: Diagnosis not present

## 2024-01-11 DIAGNOSIS — R109 Unspecified abdominal pain: Secondary | ICD-10-CM | POA: Diagnosis not present

## 2024-01-11 DIAGNOSIS — R911 Solitary pulmonary nodule: Secondary | ICD-10-CM | POA: Diagnosis not present

## 2024-01-11 DIAGNOSIS — Z0000 Encounter for general adult medical examination without abnormal findings: Secondary | ICD-10-CM | POA: Diagnosis not present

## 2024-01-11 DIAGNOSIS — R413 Other amnesia: Secondary | ICD-10-CM | POA: Diagnosis not present

## 2024-01-11 DIAGNOSIS — R519 Headache, unspecified: Secondary | ICD-10-CM | POA: Diagnosis not present

## 2024-01-11 DIAGNOSIS — R82998 Other abnormal findings in urine: Secondary | ICD-10-CM | POA: Diagnosis not present

## 2024-01-11 DIAGNOSIS — F411 Generalized anxiety disorder: Secondary | ICD-10-CM | POA: Diagnosis not present

## 2024-01-11 DIAGNOSIS — M810 Age-related osteoporosis without current pathological fracture: Secondary | ICD-10-CM | POA: Diagnosis not present

## 2024-01-25 DIAGNOSIS — R21 Rash and other nonspecific skin eruption: Secondary | ICD-10-CM | POA: Diagnosis not present

## 2024-01-25 DIAGNOSIS — L03114 Cellulitis of left upper limb: Secondary | ICD-10-CM | POA: Diagnosis not present

## 2024-02-02 DIAGNOSIS — S83281A Other tear of lateral meniscus, current injury, right knee, initial encounter: Secondary | ICD-10-CM | POA: Diagnosis not present

## 2024-02-02 DIAGNOSIS — M25561 Pain in right knee: Secondary | ICD-10-CM | POA: Diagnosis not present

## 2024-03-13 DIAGNOSIS — H35371 Puckering of macula, right eye: Secondary | ICD-10-CM | POA: Diagnosis not present

## 2024-03-13 DIAGNOSIS — H04123 Dry eye syndrome of bilateral lacrimal glands: Secondary | ICD-10-CM | POA: Diagnosis not present

## 2024-03-13 DIAGNOSIS — Z961 Presence of intraocular lens: Secondary | ICD-10-CM | POA: Diagnosis not present

## 2024-03-13 DIAGNOSIS — H401131 Primary open-angle glaucoma, bilateral, mild stage: Secondary | ICD-10-CM | POA: Diagnosis not present

## 2024-04-14 ENCOUNTER — Emergency Department (HOSPITAL_COMMUNITY)
Admission: EM | Admit: 2024-04-14 | Discharge: 2024-04-14 | Disposition: A | Discharge status: Home / Self Care | Source: Ambulatory Visit

## 2024-04-14 ENCOUNTER — Emergency Department (HOSPITAL_COMMUNITY)

## 2024-04-14 ENCOUNTER — Other Ambulatory Visit: Payer: Self-pay

## 2024-04-14 ENCOUNTER — Encounter (HOSPITAL_COMMUNITY): Payer: Self-pay

## 2024-04-14 DIAGNOSIS — R0789 Other chest pain: Secondary | ICD-10-CM | POA: Diagnosis not present

## 2024-04-14 DIAGNOSIS — R29818 Other symptoms and signs involving the nervous system: Secondary | ICD-10-CM | POA: Diagnosis not present

## 2024-04-14 DIAGNOSIS — I7774 Dissection of vertebral artery: Secondary | ICD-10-CM | POA: Diagnosis not present

## 2024-04-14 DIAGNOSIS — R079 Chest pain, unspecified: Secondary | ICD-10-CM | POA: Diagnosis not present

## 2024-04-14 DIAGNOSIS — R42 Dizziness and giddiness: Secondary | ICD-10-CM | POA: Insufficient documentation

## 2024-04-14 DIAGNOSIS — R519 Headache, unspecified: Secondary | ICD-10-CM | POA: Diagnosis not present

## 2024-04-14 DIAGNOSIS — R110 Nausea: Secondary | ICD-10-CM | POA: Diagnosis not present

## 2024-04-14 LAB — BASIC METABOLIC PANEL WITH GFR
Anion gap: 5 (ref 5–15)
BUN: 13 mg/dL (ref 8–23)
CO2: 26 mmol/L (ref 22–32)
Calcium: 9.2 mg/dL (ref 8.9–10.3)
Chloride: 106 mmol/L (ref 98–111)
Creatinine, Ser: 0.72 mg/dL (ref 0.44–1.00)
GFR, Estimated: >60 mL/min (ref >60)
Glucose, Bld: 102 mg/dL — ABNORMAL HIGH (ref 70–99)
Potassium: 4.1 mmol/L (ref 3.5–5.1)
Sodium: 137 mmol/L (ref 135–145)

## 2024-04-14 LAB — CBC
HCT: 40.9 % (ref 36.0–46.0)
Hemoglobin: 12.8 g/dL (ref 12.0–15.0)
MCH: 26.9 pg (ref 26.0–34.0)
MCHC: 31.3 g/dL (ref 30.0–36.0)
MCV: 85.9 fL (ref 80.0–100.0)
Platelets: 226 K/uL (ref 150–400)
RBC: 4.76 MIL/uL (ref 3.87–5.11)
RDW: 13.4 % (ref 11.5–15.5)
WBC: 4.7 K/uL (ref 4.0–10.5)
nRBC: 0 % (ref 0.0–0.2)

## 2024-04-14 LAB — TROPONIN I (HIGH SENSITIVITY)
Troponin I (High Sensitivity): 4 ng/L (ref <18)
Troponin I (High Sensitivity): 4 ng/L (ref <18)

## 2024-04-14 MED ORDER — ONDANSETRON HCL 4 MG/2ML IJ SOLN
4.0000 mg | Freq: Once | INTRAMUSCULAR | Status: AC
  Administered 2024-04-14: 4 mg via INTRAVENOUS
  Filled 2024-04-14: qty 2

## 2024-04-14 MED ORDER — MORPHINE SULFATE (PF) 4 MG/ML IV SOLN
4.0000 mg | Freq: Once | INTRAVENOUS | Status: AC
  Administered 2024-04-14: 4 mg via INTRAVENOUS
  Filled 2024-04-14: qty 1

## 2024-04-14 MED ORDER — MECLIZINE HCL 25 MG PO TABS
25.0000 mg | ORAL_TABLET | Freq: Once | ORAL | Status: AC
  Administered 2024-04-14: 25 mg via ORAL
  Filled 2024-04-14: qty 1

## 2024-04-14 MED ORDER — LABETALOL HCL 5 MG/ML IV SOLN
10.0000 mg | Freq: Once | INTRAVENOUS | Status: AC
  Administered 2024-04-14: 10 mg via INTRAVENOUS
  Filled 2024-04-14: qty 4

## 2024-04-14 MED ORDER — IOHEXOL 350 MG/ML SOLN
75.0000 mL | Freq: Once | INTRAVENOUS | Status: AC | PRN
  Administered 2024-04-14: 75 mL via INTRAVENOUS

## 2024-04-14 MED ORDER — MORPHINE SULFATE (PF) 2 MG/ML IV SOLN
2.0000 mg | Freq: Once | INTRAVENOUS | Status: AC
  Administered 2024-04-14: 2 mg via INTRAVENOUS
  Filled 2024-04-14: qty 1

## 2024-04-14 MED ORDER — LACTATED RINGERS IV BOLUS
1000.0000 mL | Freq: Once | INTRAVENOUS | Status: AC
  Administered 2024-04-14: 1000 mL via INTRAVENOUS

## 2024-04-14 NOTE — ED Provider Notes (Signed)
  Physical Exam  BP (!) 170/70   Pulse 74   Temp 97.7 F (36.5 C) (Oral)   Resp 11   SpO2 100%   Physical Exam  Procedures  Procedures  ED Course / MDM    Medical Decision Making Amount and/or Complexity of Data Reviewed Labs: ordered. Radiology: ordered.  Risk Prescription drug management.   Left sided neck and chest pain.  History of vertigo, has been dizzy. CTA without LVO, does have stenoses bilateral posterior cerebral P2 segments.  MRI is pending.  Received care of patient from Dr. ONEIDA.  Please see his note for prior history, physical and care.  On my reevaluation, she is feeling improved.  Her second troponin is normal and doubt ACS.  MRI shows no evidence of acute intracranial abnormality.  She plans to follow-up with her primary care doctor.     Dreama Longs, MD 04/15/24 1126

## 2024-04-14 NOTE — ED Provider Notes (Signed)
 [place] EMERGENCY DEPARTMENT AT [facility] Provider Note   CSN: [id number] Arrival date & time: 04/14/24  1153     Patient presents with: Chest Pain, Dizziness, and Nausea   Kimberly Wilcox is a 78 y.o. female.   78 year old female with past medical history of GERD and remote history of vertigo presenting to the emergency department today with pain behind her left ear and her left neck, and left chest.  The patient states that it has been going now for the past 2 days or so.  She denies any fevers or chills.  Denies any cough.  She went to see her primary care doctor today and was mentioning the chest pain was sent to the ER for further evaluation.  She reports that she is also been having some intermittent dizziness.  States that she thinks that this may feel like when she had vertigo in the past but her last episode was years ago so cannot say so for sure.  She came to the ER today for further evaluation regarding this.  She denies any significant headache with this.   Chest Pain Associated symptoms: dizziness   Dizziness Associated symptoms: chest pain        Prior to Admission medications   Medication Sig Start Date End Date Taking? Authorizing Provider  acetaminophen (TYLENOL) 325 MG tablet Take 325-650 mg by mouth every 8 (eight) hours as needed for mild pain (pain score 1-3) or headache.   Yes [provider]  latanoprost (XALATAN) 0.005 % ophthalmic solution Place 1 drop into both eyes at bedtime.  11/07/14  Yes [provider]  cyclobenzaprine  (FLEXERIL ) 10 MG tablet Take 1 tablet (10 mg total) by mouth at bedtime as needed (TMJ/jaw pain). Patient not taking: Reported on 04/14/2024 11/03/19   Craig Alan SAUNDERS, PA-C  diazepam  (VALIUM ) 2 MG tablet Take     1/2 to 1 tablet     2 to 3 x /day     for Acute Anxiety &  please try to limit to 5 days /week to avoid Addiction Patient not taking: Reported on 04/14/2024 06/12/20   Tonita Fallow, MD  dicyclomine  (BENTYL ) 20 MG tablet Take 1 tablet 3 x /day before meals for Abdominal Discomfort, Cramping, Nausea, Bloating or Diarrhea Patient not taking: Reported on 04/14/2024 09/25/19   Tonita Fallow, MD  doxycycline  (VIBRAMYCIN ) 100 MG capsule Take 1 capsule twice daily with food Patient not taking: Reported on 04/14/2024 01/15/21   Jeanine Knee, NP  fexofenadine  (ALLEGRA ) 180 MG tablet Take 1 tablet (180 mg total) by mouth daily. Patient not taking: Reported on 04/14/2024 08/29/20   Jeanine Knee, NP  fluticasone  (FLONASE ) 50 MCG/ACT nasal spray Place 2 sprays into both nostrils at bedtime. Patient not taking: Reported on 04/14/2024 08/01/19   McClanahan, Kyra, NP  meclizine  (ANTIVERT ) 25 MG tablet Take  1/2 to 1 tablet  2 to 3 x /da y as needed for Motion Sickness, Dizziness or Vertigo Patient not taking: Reported on 04/14/2024 02/10/21   Tonita Fallow, MD  nitroGLYCERIN  (NITROSTAT ) 0.4 MG SL tablet Place 1 tablet (0.4 mg total) under the tongue every 5 (five) minutes as needed for chest pain. Patient not taking: Reported on 04/14/2024 06/14/18 11/21/24  Jeanine Knee, NP  omeprazole  (PRILOSEC) 40 MG capsule Take 1 capsule (40 mg total) by mouth 2 (two) times daily. Patient not taking: Reported on 04/14/2024 08/16/19   Beather Delon Gibson, PA  ondansetron  (ZOFRAN  ODT) 8 MG disintegrating tablet 8mg  ODT  q4 hours prn nausea Patient not taking: Reported on 04/14/2024 11/13/22   Garrick Charleston, MD  PROAIR  HFA 108 (90 Base) MCG/ACT inhaler INHALE 2 PUFFS INTO THE LUNGS EVERY 4 HOURS AS NEEDED FOR WHEEZING OR SHORTNESS OF BREATH Patient not taking: Reported on 04/14/2024 11/23/18   Craig Alan SAUNDERS, PA-C    Allergies: Nitrofurantoin, Omnipaque  [iohexol ], Fexofenadine , Meloxicam , Sulfa antibiotics, and Venlafaxine    Review of Systems  Cardiovascular:  Positive for chest pain.  Neurological:  Positive for dizziness.  All other systems reviewed and are negative.   Updated Vital  Signs BP (!) 170/70   Pulse 74   Temp 97.7 F (36.5 C) (Oral)   Resp 11   SpO2 100%   Physical Exam Vitals and nursing note reviewed.   Gen: NAD Eyes: PERRL, EOMI HEENT: no oropharyngeal swelling Neck: trachea midline Resp: clear to auscultation bilaterally Card: RRR, no murmurs, rubs, or gallops Abd: nontender, nondistended Extremities: no calf tenderness, no edema Vascular: 2+ radial pulses bilaterally, 2+ DP pulses bilaterally Neuro: Cranial nerves intact, equal strength and sensation throughout bilateral upper and lower extremities with no dysmetria on finger-to-nose testing Skin: no rashes Psyc: acting appropriately   (all labs ordered are listed, but only abnormal results are displayed) Labs Reviewed  BASIC METABOLIC PANEL WITH GFR - Abnormal; Notable for the following components:      Result Value   Glucose, Bld 102 (*)    All other components within normal limits  CBC  TROPONIN I (HIGH SENSITIVITY)  TROPONIN I (HIGH SENSITIVITY)    EKG: EKG Interpretation Date/Time:  Friday April 14 2024 12:03:07 EDT Ventricular Rate:  65 PR Interval:  149 QRS Duration:  92 QT Interval:  392 QTC Calculation: 408 R Axis:   53  Text Interpretation: Sinus rhythm Multiform ventricular premature complexes Baseline wander in lead(s) aVL V1 Confirmed by Ula Barter [phone]) on 04/14/2024 12:29:02 PM  Radiology: CT ANGIO HEAD NECK W WO CM Result Date: 04/14/2024 CLINICAL DATA:  Provided history: Vertebral artery dissection suspected. Additional history provided: Left ear pain, left arm pain, intermittent dizziness, nausea, headache, chest pain. EXAM: CT ANGIOGRAPHY HEAD AND NECK WITH AND WITHOUT CONTRAST TECHNIQUE: Multidetector CT imaging of the head and neck was performed using the standard protocol during bolus administration of intravenous contrast. Multiplanar CT image reconstructions and MIPs were obtained to evaluate the vascular anatomy. Carotid stenosis measurements (when  applicable) are obtained utilizing NASCET criteria, using the distal internal carotid diameter as the denominator. RADIATION DOSE REDUCTION: This exam was performed according to the departmental dose-optimization program which includes automated exposure control, adjustment of the mA and/or kV according to patient size and/or use of iterative reconstruction technique. CONTRAST:  75mL OMNIPAQUE  IOHEXOL  350 MG/ML SOLN COMPARISON:  Brain MRI 10/26/2014.  Thyroid  ultrasound 11/10/2012. FINDINGS: CT HEAD FINDINGS Brain: No age-advanced or lobar predominant cerebral atrophy. There is no acute intracranial hemorrhage. No demarcated cortical infarct. No extra-axial fluid collection. No evidence of an intracranial mass. No midline shift. Vascular: No hyperdense vessel. Atherosclerotic calcifications. Skull: No calvarial fracture or aggressive osseous lesion. Sinuses/Orbits: No mass or acute finding within the imaged orbits. No significant paranasal sinus disease. Review of the MIP images confirms the above findings CTA NECK FINDINGS Aortic arch: Common origin of the innominate and left common carotid arteries. A sclerotic plaque within the aortic arch. No hemodynamically significant innominate or proximal subclavian artery stenosis. Right carotid system: CCA and ICA patent within the neck without stenosis or significant atherosclerotic disease. Left carotid system:  CCA and ICA patent within the neck without stenosis. Minimal atherosclerotic plaque within the proximal ICA. Vertebral arteries: Codominant and patent within the neck without stenosis or significant atherosclerotic disease. Skeleton: Edentulous maxilla. Numerous absent mandibular teeth. Mild for age cervical spondylosis. No appreciable high-grade spinal canal stenosis. No significant bony neural foraminal narrowing. Dextrocurvature of the cervical spine. Other neck: Multiple thyroid  nodules some of which have increased in size since the prior thyroid  ultrasound of  11/10/2012. The largest nodule is now located within the right thyroid  lobe inferiorly (measuring 19 mm). Upper chest: No consolidation within the imaged lung apices. Biapical pleuroparenchymal scarring. Review of the MIP images confirms the above findings CTA HEAD FINDINGS Anterior circulation: The intracranial internal carotid arteries are patent. Non-stenotic calcified plaque within both vessels. The M1 middle cerebral arteries are patent. No M2 proximal branch occlusion or high-grade proximal stenosis. The anterior cerebral arteries are patent. No intracranial aneurysm is identified. Posterior circulation: The intracranial vertebral arteries are patent. The basilar artery is patent. Mild atherosclerotic narrowing of the proximal basilar artery. The posterior cerebral arteries are patent. Atherosclerotic irregularity of both vessels. Most notably, there are severe stenoses within the posterior cerebral artery P2 segments bilaterally. A right posterior communicating artery is present. The left posterior communicating artery is diminutive or absent. Venous sinuses: Within the limitations of contrast timing, no convincing thrombus. Anatomic variants: As described. Review of the MIP images confirms the above findings IMPRESSION: Non-contrast head CT: No evidence of an acute intracranial abnormality. CTA neck: 1. The common carotid, internal carotid and vertebral arteries are patent within the neck without stenosis. Minimal atherosclerotic plaque within the proximal left internal carotid artery. 2. Multiple thyroid  nodules some of which have increased in size since the thyroid  ultrasound of 11/10/2012. The largest nodule is now located within the right thyroid  lobe inferiorly (measuring 19 mm). A non-emergent thyroid  ultrasound is recommended for further evaluation. CTA head: 1. No proximal intracranial large vessel occlusion is identified. 2. Intracranial atherosclerotic disease as described. Most notably, there are  severe stenoses within the bilateral posterior cerebral artery P2 segments. Electronically Signed   By: Rockey Childs D.O.   On: 04/14/2024 15:35   DG Chest 2 View Result Date: 04/14/2024 CLINICAL DATA:  Chest pain. EXAM: CHEST - 2 VIEW COMPARISON:  October 24, 2018. FINDINGS: The heart size and mediastinal contours are within normal limits. Both lungs are clear. The visualized skeletal structures are unremarkable. IMPRESSION: No active cardiopulmonary disease. Electronically Signed   By: Lynwood Landy Raddle M.D.   On: 04/14/2024 14:51     Procedures   Medications Ordered in the ED  lactated ringers  bolus 1,000 mL (has no administration in time range)  morphine  (PF) 2 MG/ML injection 2 mg (2 mg Intravenous Given 04/14/24 1333)  ondansetron  (ZOFRAN ) injection 4 mg (4 mg Intravenous Given 04/14/24 1332)  meclizine  (ANTIVERT ) tablet 25 mg (25 mg Oral Given 04/14/24 1332)  iohexol  (OMNIPAQUE ) 350 MG/ML injection 75 mL (75 mLs Intravenous Contrast Given 04/14/24 1409)  morphine  (PF) 4 MG/ML injection 4 mg (4 mg Intravenous Given 04/14/24 1507)  labetalol  (NORMODYNE ) injection 10 mg (10 mg Intravenous Given 04/14/24 1507)                                    Medical Decision Making 78 year old female with past medical history of GERD and vertigo in the past presenting to the emergency department today with what seems  like atypical chest pain as well as some dizziness.  She is reporting some pain going up the left side of the back for now with the dizziness.  Will further evaluate with a CT angiogram to evaluate for vertebral artery dissection.  Will obtain EKG, chest x-ray, and troponin to evaluate for ACS, pulmonary edema, pulmonary infiltrates or pneumothorax.  Based on description of her symptoms suspicion for aortic dissection is low at this time.  CT angiogram of the neck should provide some evaluation for ascending dissection that would be leading to dizziness.  I will give the patient pain and nausea  medications here.  Will give her meclizine  for dizziness as well as IV fluids and reevaluate.  The patient's cardiac evaluation here was reassuring.  Chest x-ray is unremarkable.  CT angiogram is negative.  On reassessment the patient is still dizzy but her pain has improved.  She did have a brief episode of some abdominal cramping that was severe after IV contrast.  This has resolved at this time.  Her abdominal exam is reassuring.  Will further evaluate the patient here with an MRI of her brain about 4 central etiology for her dizziness.  I think that if this is unremarkable that she may be safely discharged.  Amount and/or Complexity of Data Reviewed Labs: ordered. Radiology: ordered.  Risk Prescription drug management.        Final diagnoses:  Atypical chest pain  Dizziness    ED Discharge Orders     None          Ula Prentice SAUNDERS, MD 04/14/24 [phone]

## 2024-04-14 NOTE — ED Triage Notes (Addendum)
 Pt sent from UC for left ear pain, left arm pain, intermittent dizziness, nausea, headache, and chest pain

## 2024-04-18 DIAGNOSIS — R413 Other amnesia: Secondary | ICD-10-CM | POA: Diagnosis not present

## 2024-04-18 DIAGNOSIS — R519 Headache, unspecified: Secondary | ICD-10-CM | POA: Diagnosis not present

## 2024-04-18 DIAGNOSIS — I700 Atherosclerosis of aorta: Secondary | ICD-10-CM | POA: Diagnosis not present

## 2024-04-18 DIAGNOSIS — R0789 Other chest pain: Secondary | ICD-10-CM | POA: Diagnosis not present

## 2024-04-18 DIAGNOSIS — I10 Essential (primary) hypertension: Secondary | ICD-10-CM | POA: Diagnosis not present

## 2024-04-18 DIAGNOSIS — E041 Nontoxic single thyroid nodule: Secondary | ICD-10-CM | POA: Diagnosis not present

## 2024-04-18 DIAGNOSIS — N644 Mastodynia: Secondary | ICD-10-CM | POA: Diagnosis not present

## 2024-04-19 ENCOUNTER — Other Ambulatory Visit: Payer: Self-pay | Admitting: Internal Medicine

## 2024-04-19 DIAGNOSIS — E041 Nontoxic single thyroid nodule: Secondary | ICD-10-CM

## 2024-04-20 ENCOUNTER — Inpatient Hospital Stay
Admission: RE | Admit: 2024-04-20 | Discharge: 2024-04-20 | Discharge status: Home / Self Care | Source: Ambulatory Visit | Attending: Internal Medicine | Admitting: Internal Medicine

## 2024-04-20 DIAGNOSIS — E042 Nontoxic multinodular goiter: Secondary | ICD-10-CM | POA: Diagnosis not present

## 2024-04-20 DIAGNOSIS — E041 Nontoxic single thyroid nodule: Secondary | ICD-10-CM

## 2024-06-29 DIAGNOSIS — M1711 Unilateral primary osteoarthritis, right knee: Secondary | ICD-10-CM | POA: Diagnosis not present

## 2024-07-06 DIAGNOSIS — M1711 Unilateral primary osteoarthritis, right knee: Secondary | ICD-10-CM | POA: Diagnosis not present

## 2024-07-06 DIAGNOSIS — M25561 Pain in right knee: Secondary | ICD-10-CM | POA: Diagnosis not present

## 2024-07-13 DIAGNOSIS — M1711 Unilateral primary osteoarthritis, right knee: Secondary | ICD-10-CM | POA: Diagnosis not present

## 2024-07-13 DIAGNOSIS — M25561 Pain in right knee: Secondary | ICD-10-CM | POA: Diagnosis not present
